# Patient Record
Sex: Female | Born: 1985 | Race: White | Hispanic: No | Marital: Married | Smoking: Never smoker
Health system: Southern US, Community
[De-identification: ages and names within clinical notes are randomized; demographics above are authoritative.]

## PROBLEM LIST (undated history)

## (undated) ENCOUNTER — Inpatient Hospital Stay (HOSPITAL_COMMUNITY): Payer: Self-pay

## (undated) DIAGNOSIS — Z889 Allergy status to unspecified drugs, medicaments and biological substances status: Secondary | ICD-10-CM

## (undated) DIAGNOSIS — Z8619 Personal history of other infectious and parasitic diseases: Secondary | ICD-10-CM

## (undated) DIAGNOSIS — K589 Irritable bowel syndrome without diarrhea: Secondary | ICD-10-CM

## (undated) DIAGNOSIS — Z8249 Family history of ischemic heart disease and other diseases of the circulatory system: Secondary | ICD-10-CM

## (undated) DIAGNOSIS — Z3480 Encounter for supervision of other normal pregnancy, unspecified trimester: Secondary | ICD-10-CM

## (undated) DIAGNOSIS — Z87898 Personal history of other specified conditions: Secondary | ICD-10-CM

## (undated) DIAGNOSIS — Z8669 Personal history of other diseases of the nervous system and sense organs: Secondary | ICD-10-CM

## (undated) DIAGNOSIS — R110 Nausea: Secondary | ICD-10-CM

## (undated) HISTORY — DX: Personal history of other infectious and parasitic diseases: Z86.19

## (undated) HISTORY — DX: Nausea: R11.0

## (undated) HISTORY — PX: WISDOM TOOTH EXTRACTION: SHX21

## (undated) HISTORY — DX: Family history of ischemic heart disease and other diseases of the circulatory system: Z82.49

## (undated) HISTORY — DX: Personal history of other specified conditions: Z87.898

## (undated) HISTORY — DX: Allergy status to unspecified drugs, medicaments and biological substances: Z88.9

## (undated) HISTORY — DX: Encounter for supervision of other normal pregnancy, unspecified trimester: Z34.80

## (undated) HISTORY — PX: ROOT CANAL: SHX2363

## (undated) HISTORY — DX: Irritable bowel syndrome, unspecified: K58.9

## (undated) HISTORY — DX: Personal history of other diseases of the nervous system and sense organs: Z86.69

---

## 2012-10-03 LAB — CHG CYTOPATH CERV/VAG THIN LAYER: Pap: NEGATIVE

## 2012-10-03 LAB — OB RESULTS CONSOLE GC/CHLAMYDIA
CHLAMYDIA, DNA PROBE: NEGATIVE
GC PROBE AMP, GENITAL: NEGATIVE

## 2012-10-03 LAB — VITAMIN D 25 HYDROXY (VIT D DEFICIENCY, FRACTURES): Vit D, 25-Hydroxy: 14.6

## 2013-03-19 ENCOUNTER — Ambulatory Visit (INDEPENDENT_AMBULATORY_CARE_PROVIDER_SITE_OTHER): Payer: Managed Care, Other (non HMO) | Admitting: Family Medicine

## 2013-03-19 ENCOUNTER — Encounter: Payer: Self-pay | Admitting: Family Medicine

## 2013-03-19 VITALS — BP 122/68 | HR 72 | Temp 98.6°F | Ht 63.0 in | Wt 135.0 lb

## 2013-03-19 DIAGNOSIS — R197 Diarrhea, unspecified: Secondary | ICD-10-CM | POA: Insufficient documentation

## 2013-03-19 DIAGNOSIS — G43909 Migraine, unspecified, not intractable, without status migrainosus: Secondary | ICD-10-CM

## 2013-03-19 DIAGNOSIS — R109 Unspecified abdominal pain: Secondary | ICD-10-CM

## 2013-03-19 MED ORDER — CYCLOBENZAPRINE HCL 10 MG PO TABS: ORAL_TABLET | ORAL | Status: DC

## 2013-03-19 NOTE — Patient Instructions (Addendum)
Take a flexeril (muscle relaxer) at bedtime each night to see if we can attain better sleep  You can take it for a headache also - but not more than one pill every 8 hours  Avoid caffeine entirely and stay active  Go to bed and get up at the same time every day  Keep a headache diary - bring it to next visit  We will refer you to GI at check out Get citurcel - one serving daily for fiber - this may help Follow up with me in about 6-8 weeks

## 2013-03-19 NOTE — Assessment & Plan Note (Signed)
See assessment for abd pain  Ref to GI

## 2013-03-19 NOTE — Assessment & Plan Note (Signed)
Ongoing - daily with frequent loose stool and hx of crohn's in family  Will tx with fiber- citrucel to see if this is helpful (disc poss of IBS) Given family hx will also ref to GI for further eval

## 2013-03-19 NOTE — Assessment & Plan Note (Signed)
Once weekly - classic symptoms but without aura Suspect major triggers are poor sleep and neck tension  Will try flexeril 10 mg at bedtime (and prn migraine with caution - and understanding no more than 1 pill every 8 hours) Disc poss side eff Long disc about triggers/ handout given/ disc better sleep hygiene and disc avoiding caffeine and nitrates Disc analgesic rebound phenomenon  >25 min spent with face to face with patient, >50% counseling and/or coordinating care   F/u 6-8 weeks May consider triptan for worse ha / may consider proph med

## 2013-03-19 NOTE — Progress Notes (Signed)
Subjective:    Patient ID: Alyssa Gutierrez, female    DOB: 09-10-1986, 27 y.o.   MRN: [id number]  HPI Here to establish as a new patient   Sees Dr Janene Harvey for gyn at [facility] (Dr Harold Hedge before) Last pap 11/13 Gyn history Is on ortho tri cyclen lo Periods are pretty regular  Getting married in October - is getting married at [place] word!  Then will think about starting a family   imms- ? Last Td and does not get flu shot    Hx of allergic rhinitis - seasonal and pet dander  Usually does not take anything / just occ zyrtec and benadryl   Hx of heart M in child hood- that corrected itself   Hx of migraine  - better past 5-10 years Gets them about 1 time per week Photophobia/ phonophobia/ nausea / and movement makes it worse  Usually frontal - starts on one side and then goes to the other side  Will take excedrin migraine-no longer works  Other nsaids do not work and tylenol does not work  Has not seen doctor for migraines  Headache usually starts right before she goes home - occ has to leave work  Headache lasts until she goes away -sometimes lasts until she wakes up  Triggers - hunger , ? Fatigue/ ? Sleep habits  Does not drink a lot of caffeine  Not a lot of stress  Likes her job  Does walk for exercise -- climbs stairs at work and at home  No fam hx of migraine    Also has upset stomach a lot  Loose stool  Abdominal pain - discomfort - usually low abdomen  Not pelvic  No blood in stool or dark stool  Gets nauseated -does not vomit No dairy - thinks she is lactose intol [place] food does not agree with her - but no other triggers  gmother had crohnss and aunt has it as well   There are no active problems to display for this patient.  Past Medical History  Diagnosis Date  . History of chicken pox     age 10  . History of headache   . History of allergy   . Family history of heart murmur     at birth  . History of migraine    Past Surgical History   Procedure Laterality Date  . Wisdom tooth extraction      age 57  . Root canal      age 42   History  Substance Use Topics  . Smoking status: Never Smoker   . Smokeless tobacco: Not on file  . Alcohol Use: No   Family History  Problem Relation Age of Onset  . Diabetes Paternal Grandmother    No Known Allergies No current outpatient prescriptions on file prior to visit.   No current facility-administered medications on file prior to visit.       Review of Systems Review of Systems  Constitutional: Negative for fever, appetite change, fatigue and unexpected weight change.  Eyes: Negative for pain and visual disturbance.  Respiratory: Negative for cough and shortness of breath.   Cardiovascular: Negative for cp or palpitations    Gastrointestinal: Negative for blood in stool/ dark stool/ vomiting or bloating  Genitourinary: Negative for urgency and frequency.  Skin: Negative for pallor or rash   Neurological: Negative for weakness, light-headedness, numbness and pos for headaches Hematological: Negative for adenopathy. Does not bruise/bleed easily.  Psychiatric/Behavioral: Negative for  dysphoric mood. The patient is not nervous/anxious.         Objective:   Physical Exam  Constitutional: She appears well-developed and well-nourished. No distress.  HENT:  Head: Normocephalic and atraumatic.  Right Ear: External ear normal.  Left Ear: External ear normal.  Nose: Nose normal.  Mouth/Throat: Oropharynx is clear and moist.  No sinus or temporal tenderness  Eyes: Conjunctivae and EOM are normal. Pupils are equal, round, and reactive to light. Right eye exhibits no discharge. Left eye exhibits no discharge. No scleral icterus.  Neck: Normal range of motion. Neck supple. No JVD present. Carotid bruit is not present. No thyromegaly present.  Cardiovascular: Normal rate, regular rhythm, normal heart sounds and intact distal pulses.  Exam reveals no gallop.   Pulmonary/Chest:  Effort normal and breath sounds normal. No respiratory distress. She has no wheezes. She has no rales.  Abdominal: Soft. Bowel sounds are normal. She exhibits no distension, no abdominal bruit and no mass. There is no hepatosplenomegaly. There is no tenderness. There is no rebound, no guarding, no CVA tenderness, no tenderness at McBurney's point and negative Murphy's sign.  Musculoskeletal: She exhibits no edema and no tenderness.  Lymphadenopathy:    She has no cervical adenopathy.  Neurological: She is alert. She has normal reflexes. No cranial nerve deficit. She exhibits normal muscle tone. Coordination normal.  Skin: Skin is warm and dry. No rash noted. No erythema. No pallor.  Psychiatric: She has a normal mood and affect.          Assessment & Plan:

## 2013-04-09 ENCOUNTER — Ambulatory Visit (INDEPENDENT_AMBULATORY_CARE_PROVIDER_SITE_OTHER): Payer: Managed Care, Other (non HMO) | Admitting: Internal Medicine

## 2013-04-09 ENCOUNTER — Other Ambulatory Visit (INDEPENDENT_AMBULATORY_CARE_PROVIDER_SITE_OTHER): Payer: Managed Care, Other (non HMO)

## 2013-04-09 ENCOUNTER — Encounter: Payer: Self-pay | Admitting: Internal Medicine

## 2013-04-09 VITALS — BP 108/70 | HR 88 | Ht 63.0 in | Wt 137.5 lb

## 2013-04-09 DIAGNOSIS — R1084 Generalized abdominal pain: Secondary | ICD-10-CM

## 2013-04-09 DIAGNOSIS — K589 Irritable bowel syndrome without diarrhea: Secondary | ICD-10-CM

## 2013-04-09 DIAGNOSIS — R110 Nausea: Secondary | ICD-10-CM

## 2013-04-09 LAB — CBC WITH DIFFERENTIAL/PLATELET
Basophils Absolute: 0 K/uL (ref 0.0–0.1)
Basophils Relative: 0.4 % (ref 0.0–3.0)
Eosinophils Absolute: 0.1 K/uL (ref 0.0–0.7)
Eosinophils Relative: 1.8 % (ref 0.0–5.0)
HCT: 39.8 % (ref 36.0–46.0)
Hemoglobin: 13.4 g/dL (ref 12.0–15.0)
Lymphocytes Relative: 28.8 % (ref 12.0–46.0)
Lymphs Abs: 2.3 K/uL (ref 0.7–4.0)
MCHC: 33.7 g/dL (ref 30.0–36.0)
MCV: 87 fl (ref 78.0–100.0)
Monocytes Absolute: 0.6 K/uL (ref 0.1–1.0)
Monocytes Relative: 7.3 % (ref 3.0–12.0)
Neutro Abs: 5 K/uL (ref 1.4–7.7)
Neutrophils Relative %: 61.7 % (ref 43.0–77.0)
Platelets: 284 K/uL (ref 150.0–400.0)
RBC: 4.57 Mil/uL (ref 3.87–5.11)
RDW: 12.9 % (ref 11.5–14.6)
WBC: 8.1 K/uL (ref 4.5–10.5)

## 2013-04-09 LAB — IGA: IgA: 294 mg/dL (ref 68–378)

## 2013-04-09 NOTE — Patient Instructions (Addendum)
Your physician has requested that you go to the basement for the following lab work before leaving today: CBC, Tissue Transglutaminase, Serum IgA, ifob.  You have been given a brochure in IBS.   cc: Roxy Manns, MD

## 2013-04-09 NOTE — Progress Notes (Signed)
HISTORY OF PRESENT ILLNESS:  Alyssa Gutierrez is a 27 y.o. female with a history of migraine headaches, who is sent today by Dr. Milinda Antis regarding chronic abdominal complaints. Patient reports problems with daily "stomachache" since about age 44. Typically, symptoms began about one hour after awakening. She notices lower abdominal discomfort associated with nausea. The discomfort is described as either dull ache lasting about 30 or 40 minutes or sharp pains. There can be associated urgency and loose stools. Symptoms are improved after defecation. Symptoms are often and exacerbated by meals and anxiety. Approximately 2 times per month she can be awoken with discomfort and nausea. She denies vomiting or bleeding. She has gained about 10 or 15 pounds in the past 2 years. She can have occasional constipation, though bowels tend to be more in the loose side. Recently started cyclobenzaprine for migraines. This has resulted in some constipation. She tells me that she has a low vitamin D level. She has tried Pepto-Bismol, Mylanta, and Imodium without relief in symptoms. Her family history is remarkable for a cousin with celiac disease as well as an aunt and a grandmother with Crohn's disease. She denies having have laboratories or other workup  REVIEW OF SYSTEMS:  All non-GI ROS negative except for sinus and allergy trouble, headaches, and menstrual cramps  Past Medical History  Diagnosis Date  . History of chicken pox     age 88  . History of headache   . History of allergy   . Family history of heart murmur     at birth  . History of migraine     Past Surgical History  Procedure Laterality Date  . Wisdom tooth extraction      age 89  . Root canal      age 68    Social History Alyssa Gutierrez  reports that she has never smoked. She has never used smokeless tobacco. She reports that she does not drink alcohol or use illicit drugs.  family history includes Diabetes in her paternal grandmother;  Heart murmur in her brother; and Hypertension in her father.  No Known Allergies     PHYSICAL EXAMINATION: Vital signs: BP 108/70  Pulse 88  Ht 5\' 3"  (1.6 m)  Wt 137 lb 8 oz (62.37 kg)  BMI 24.36 kg/m2  LMP 03/28/2013  Constitutional: generally well-appearing, no acute distress Psychiatric: alert and oriented x3, cooperative Eyes: extraocular movements intact, anicteric, conjunctiva pink Mouth: oral pharynx moist, no lesions Neck: supple no lymphadenopathy Cardiovascular: heart regular rate and rhythm, no murmur Lungs: clear to auscultation bilaterally Abdomen: soft, nontender, nondistended, no obvious ascites, no peritoneal signs, normal bowel sounds, no organomegaly Rectal: Omitted Extremities: no lower extremity edema bilaterally Skin: no lesions on visible extremities Neuro: No focal deficits. No asterixis.    ASSESSMENT:  #1. Chronic lower abdominal complaints most consistent with irritable bowel syndrome. Rule out other entities masquerading as IBS.   PLAN:  #1. Tissue transglutaminase antibody, serum IgA level, CBC #2. Stool Hemoccult cards #3. If the above studies are negative, then treatment for IBS with antispasmodics such as Librax. Would anticipate office followup thereafter #4. Discussion of IBS and supplemental literature provided for her review

## 2013-04-13 ENCOUNTER — Other Ambulatory Visit (INDEPENDENT_AMBULATORY_CARE_PROVIDER_SITE_OTHER): Payer: Managed Care, Other (non HMO)

## 2013-04-13 DIAGNOSIS — R1084 Generalized abdominal pain: Secondary | ICD-10-CM

## 2013-04-13 DIAGNOSIS — K589 Irritable bowel syndrome without diarrhea: Secondary | ICD-10-CM

## 2013-04-13 DIAGNOSIS — R110 Nausea: Secondary | ICD-10-CM

## 2013-04-16 ENCOUNTER — Other Ambulatory Visit: Payer: Self-pay | Admitting: Internal Medicine

## 2013-04-16 MED ORDER — CILIDINIUM-CHLORDIAZEPOXIDE 2.5-5 MG PO CAPS: ORAL_CAPSULE | ORAL | Status: DC

## 2013-05-01 ENCOUNTER — Encounter: Payer: Self-pay | Admitting: Family Medicine

## 2013-05-01 ENCOUNTER — Ambulatory Visit (INDEPENDENT_AMBULATORY_CARE_PROVIDER_SITE_OTHER): Payer: Managed Care, Other (non HMO) | Admitting: Family Medicine

## 2013-05-01 VITALS — BP 116/72 | HR 115 | Temp 98.9°F | Ht 63.0 in | Wt 139.2 lb

## 2013-05-01 DIAGNOSIS — G43909 Migraine, unspecified, not intractable, without status migrainosus: Secondary | ICD-10-CM

## 2013-05-01 MED ORDER — GABAPENTIN 300 MG PO CAPS: 300.0000 mg | ORAL_CAPSULE | Freq: Two times a day (BID) | ORAL | Status: DC

## 2013-05-01 NOTE — Progress Notes (Signed)
Subjective:    Patient ID: Alyssa Gutierrez, female    DOB: Sep 08, 1986, 27 y.o.   MRN: [id number]  HPI Doing well overall - here for f/u of headaches  They are getting a little less frequent- but most weekends she gets at least one   Over father's day weekend -had one really bad headache - took flexeril  No other medicines   She sleeps better - still wakes up but easier for her to go of sleep   During the week - goes to bed 10:30- 11 and gets up at 6:30 Weekends-the same schedule and gets up at 7:30 - then sometimes wakes up with a headache   No mood issues at all   Saw GI about her symptoms - and given librax  More constipation - due to the flexeril however  (but helps with the cramps) Lab work was ok  Cutting back on caffeine   Patient Active Problem List   Diagnosis Date Noted  . Diarrhea 03/19/2013  . Abdominal  pain, other specified site 03/19/2013  . Migraine headache 03/19/2013   Past Medical History  Diagnosis Date  . History of chicken pox     age 89  . History of headache   . History of allergy   . Family history of heart murmur     at birth  . History of migraine    Past Surgical History  Procedure Laterality Date  . Wisdom tooth extraction      age 37  . Root canal      age 35   History  Substance Use Topics  . Smoking status: Never Smoker   . Smokeless tobacco: Never Used  . Alcohol Use: No   Family History  Problem Relation Age of Onset  . Diabetes Paternal Grandmother   . Hypertension Father   . Heart murmur Brother    No Known Allergies Current Outpatient Prescriptions on File Prior to Visit  Medication Sig Dispense Refill  . clidinium-chlordiazePOXIDE (LIBRAX) 2.5-5 MG per capsule Take 1 by mouth before meals as needed  60 capsule  3  . cyclobenzaprine (FLEXERIL) 10 MG tablet Take 1 by mouth each bedtime and 1 as needed for headache (no 2 doses closer than 8 hours apart however)  45 tablet  2  . ORTHO TRI-CYCLEN LO 0.18/0.215/0.25 MG-25  MCG tab Take 1 tablet by mouth daily.       No current facility-administered medications on file prior to visit.      Review of Systems Review of Systems  Constitutional: Negative for fever, appetite change, fatigue and unexpected weight change.  Eyes: Negative for pain and visual disturbance.  Respiratory: Negative for cough and shortness of breath.   Cardiovascular: Negative for cp or palpitations    Gastrointestinal: Negative for nausea, diarrhea and constipation.  Genitourinary: Negative for urgency and frequency.  Skin: Negative for pallor or rash   Neurological: Negative for weakness, light-headedness, numbness and pos for headaches.  Hematological: Negative for adenopathy. Does not bruise/bleed easily.  Psychiatric/Behavioral: Negative for dysphoric mood. The patient is not nervous/anxious.         Objective:   Physical Exam  Constitutional: She appears well-developed and well-nourished. No distress.  HENT:  Head: Normocephalic and atraumatic.  Mouth/Throat: Oropharynx is clear and moist.  Eyes: Conjunctivae and EOM are normal. Pupils are equal, round, and reactive to light. Right eye exhibits no discharge. Left eye exhibits no discharge. No scleral icterus.  Neck: Normal range of motion. Neck supple.  No thyromegaly present.  Cardiovascular: Normal rate, regular rhythm and intact distal pulses.   No murmur heard. Pulmonary/Chest: Effort normal and breath sounds normal. No respiratory distress. She has no wheezes.  Musculoskeletal: She exhibits no edema.  Lymphadenopathy:    She has no cervical adenopathy.  Neurological: She is alert. She has normal reflexes. She displays no tremor. No cranial nerve deficit or sensory deficit. She exhibits normal muscle tone. Coordination and gait normal.  No focal cerebellar signs   Skin: Skin is warm and dry. No rash noted. No erythema. No pallor.  Psychiatric: She has a normal mood and affect.          Assessment & Plan:

## 2013-05-01 NOTE — Assessment & Plan Note (Signed)
Flexeril helps sleep and ha but pt is still having weekend ha regularly Disc lifestyle habits and mt good sleep Will try gabapentin for migraine prophylaxis and start with 300 at bedtime-titrate to bid if tolerated F/u 4-6 wk Rev side eff-will call if problems

## 2013-05-01 NOTE — Patient Instructions (Addendum)
Start gabapentin 300 mg about 1 hour before bed - if you do well after 1-2 weeks increase it to 1 pill twice daily  It is common to feel woozy and sleepy initially - but most patients get used to that very fast  If you have side effects that do not go away- let me know  Use flexeril when you need it for a headache  Try to make sleep habits the same every day  Follow up with me in 6-8 weeks

## 2013-05-15 ENCOUNTER — Ambulatory Visit: Payer: Managed Care, Other (non HMO) | Admitting: Internal Medicine

## 2013-05-15 ENCOUNTER — Encounter: Payer: Self-pay | Admitting: Internal Medicine

## 2013-05-15 ENCOUNTER — Ambulatory Visit (INDEPENDENT_AMBULATORY_CARE_PROVIDER_SITE_OTHER): Payer: Managed Care, Other (non HMO) | Admitting: Internal Medicine

## 2013-05-15 VITALS — BP 100/60 | HR 66 | Ht 63.0 in | Wt 136.0 lb

## 2013-05-15 DIAGNOSIS — R109 Unspecified abdominal pain: Secondary | ICD-10-CM

## 2013-05-15 DIAGNOSIS — K589 Irritable bowel syndrome without diarrhea: Secondary | ICD-10-CM

## 2013-05-15 MED ORDER — ALIGN PO CAPS: 1.0000 | ORAL_CAPSULE | Freq: Every day | ORAL | Status: DC

## 2013-05-15 MED ORDER — HYOSCYAMINE SULFATE 0.125 MG SL SUBL: SUBLINGUAL_TABLET | SUBLINGUAL | Status: DC

## 2013-05-15 NOTE — Patient Instructions (Addendum)
We have sent the following medications to your pharmacy for you to pick up at your convenience:  Levsin  We have given you samples of Align. This puts good bacteria back into your colon. You should take 1 capsule by mouth once daily. If this works well for you, it can be purchased over the counter.  Please follow up with Dr. Marina Goodell in 6 months  You have been given some information on IBS

## 2013-05-15 NOTE — Progress Notes (Signed)
HISTORY OF PRESENT ILLNESS:  Alyssa Gutierrez is a 27 y.o. female  With a history of migraine headaches who was evaluated in 04/09/2013 regarding chronic abdominal complaints. She was felt to have irritable bowel syndrome. CBC, tissue transglutaminase antibody, serum IgA level, and stool Hemoccult cards were all normal. She was prescribed Librax before meals as needed. She tries on a couple of occasions but reports intolerance with a "gas bubble" type pain. She presents today for followup. Overall, she states that her bowel habits seem to have improved after she initiated her new medication for migraines. This seems to result in less diarrhea. She has had 2 occasions with significant cramping abdominal discomfort. She inquired about probiotic. She is planning her wedding.  REVIEW OF SYSTEMS:  All non-GI ROS negative except for headaches, sleeping problems  Past Medical History  Diagnosis Date  . History of chicken pox     age 62  . History of headache   . History of allergy   . Family history of heart murmur     at birth  . History of migraine     Past Surgical History  Procedure Laterality Date  . Wisdom tooth extraction      age 64  . Root canal      age 39    Social History Alyssa Gutierrez  reports that she has never smoked. She has never used smokeless tobacco. She reports that she does not drink alcohol or use illicit drugs.  family history includes Diabetes in her paternal grandmother; Heart murmur in her brother; and Hypertension in her father.  There is no history of Colon cancer.  No Known Allergies     PHYSICAL EXAMINATION: Vital signs: BP 100/60  Pulse 66  Ht 5\' 3"  (1.6 m)  Wt 136 lb (61.689 kg)  BMI 24.1 kg/m2  LMP 05/14/2013 General: Well-developed, well-nourished, no acute distress HEENT: Sclerae are anicteric, conjunctiva pink. Oral mucosa intact Lungs: Clear Heart: Regular Abdomen: soft, nontender, nondistended, no obvious ascites, no peritoneal signs,  normal bowel sounds. No organomegaly. Extremities: No edema Psychiatric: alert and oriented x3. Cooperative   ASSESSMENT:  #1. IBS. Chronic. No evidence for inflammatory bowel disease or other organic processes.  PLAN:  #1. Discussion on IBS (detailed) today #2. Literature on IBS provided for her review #3. Stop Librax #4. Levsin sublingual when necessary. Prescribed #5. Probiotic align one daily for 2 weeks. If helpful, may use on demand. Multiple samples given #6. Office followup in 6 months. Contact the office in the interim for any questions or problems. Resume general medical follow up with Dr. Milinda Antis

## 2013-06-07 ENCOUNTER — Telehealth: Payer: Self-pay

## 2013-06-07 NOTE — Telephone Encounter (Signed)
pt left v/m; pt already scheduled to see Dr Milinda Antis on 06/15/13 at 3:45 pm, pts employer requires barometric screening form filled out to get decreased insurance rate; pt wants to know if can get glucose and cholesterol checked at 06/15/13 appt.Please advise.

## 2013-06-07 NOTE — Telephone Encounter (Signed)
That is fine - have her fast for appt or eat light

## 2013-06-07 NOTE — Telephone Encounter (Signed)
Pt notified we can fill out form at appt but try to fast/eat light for labs that day

## 2013-06-15 ENCOUNTER — Ambulatory Visit (INDEPENDENT_AMBULATORY_CARE_PROVIDER_SITE_OTHER): Payer: Managed Care, Other (non HMO) | Admitting: Family Medicine

## 2013-06-15 ENCOUNTER — Encounter: Payer: Self-pay | Admitting: Family Medicine

## 2013-06-15 VITALS — BP 108/74 | HR 84 | Temp 98.6°F | Ht 63.0 in | Wt 136.5 lb

## 2013-06-15 DIAGNOSIS — Z131 Encounter for screening for diabetes mellitus: Secondary | ICD-10-CM | POA: Insufficient documentation

## 2013-06-15 DIAGNOSIS — Z13220 Encounter for screening for lipoid disorders: Secondary | ICD-10-CM | POA: Insufficient documentation

## 2013-06-15 DIAGNOSIS — G43909 Migraine, unspecified, not intractable, without status migrainosus: Secondary | ICD-10-CM

## 2013-06-15 MED ORDER — GABAPENTIN 100 MG PO CAPS: 100.0000 mg | ORAL_CAPSULE | Freq: Every day | ORAL | Status: DC | PRN

## 2013-06-15 MED ORDER — GABAPENTIN 300 MG PO CAPS: 300.0000 mg | ORAL_CAPSULE | Freq: Every day | ORAL | Status: DC

## 2013-06-15 NOTE — Patient Instructions (Addendum)
Keep wake and sleep times as stable as possible  Stay hydrated  Try the gabapentin 100 mg during the day if you need or want to - take with food

## 2013-06-15 NOTE — Progress Notes (Signed)
Subjective:    Patient ID: Alyssa Gutierrez, female    DOB: August 18, 1986, 27 y.o.   MRN: [id number]  HPI Here for follow up for migraines   Was having them 2-3 times per week Since then has had 3 headaches only-- very happy about that   Gabapentin - taking at night only  Day time dose makes her nauseated (got through about 2 days and that was it)  Most of headaches are on the weekend   During the week - bed at 10:30 and gets up at 6:15- wishes she could get more  On the weekends may push bedtime until 11 and sleeps no later than 7:30  Most of her headaches are at night  No more soda or coffee  Diet on the weekends - she eats at very different times , and she could be eating some MSG Exercises more on the weekend   Stress level is high - at work and also getting married   Patient Active Problem List   Diagnosis Date Noted  . Diarrhea 03/19/2013  . Abdominal  pain, other specified site 03/19/2013  . Migraine headache 03/19/2013   Past Medical History  Diagnosis Date  . History of chicken pox     age 65  . History of headache   . History of allergy   . Family history of heart murmur     at birth  . History of migraine    Past Surgical History  Procedure Laterality Date  . Wisdom tooth extraction      age 26  . Root canal      age 31   History  Substance Use Topics  . Smoking status: Never Smoker   . Smokeless tobacco: Never Used  . Alcohol Use: No   Family History  Problem Relation Age of Onset  . Diabetes Paternal Grandmother   . Hypertension Father   . Heart murmur Brother   . Colon cancer Neg Hx    No Known Allergies Current Outpatient Prescriptions on File Prior to Visit  Medication Sig Dispense Refill  . bifidobacterium infantis (ALIGN) capsule Take 1 capsule by mouth daily.  42 capsule  0  . clidinium-chlordiazePOXIDE (LIBRAX) 2.5-5 MG per capsule Take 1 by mouth before meals as needed  60 capsule  3  . cyclobenzaprine (FLEXERIL) 10 MG tablet Take 1  by mouth each bedtime and 1 as needed for headache (no 2 doses closer than 8 hours apart however)  45 tablet  2  . hyoscyamine (LEVSIN SL) 0.125 MG SL tablet Take 1-2 tablets sublingually as needed for pain  40 tablet  0  . ORTHO TRI-CYCLEN LO 0.18/0.215/0.25 MG-25 MCG tab Take 1 tablet by mouth daily.       No current facility-administered medications on file prior to visit.     Also needs biometric screening for work  Review of Systems Review of Systems  Constitutional: Negative for fever, appetite change, fatigue and unexpected weight change.  Eyes: Negative for pain and visual disturbance.  Respiratory: Negative for cough and shortness of breath.   Cardiovascular: Negative for cp or palpitations    Gastrointestinal: Negative for nausea, diarrhea and constipation.  Genitourinary: Negative for urgency and frequency.  Skin: Negative for pallor or rash   Neurological: Negative for weakness, light-headedness, numbness and pos for ha that are improving Hematological: Negative for adenopathy. Does not bruise/bleed easily.  Psychiatric/Behavioral: Negative for dysphoric mood. The patient is not nervous/anxious.  Objective:   Physical Exam  Constitutional: She appears well-developed and well-nourished. No distress.  HENT:  Head: Normocephalic and atraumatic.  Mouth/Throat: Oropharynx is clear and moist.  Eyes: Conjunctivae and EOM are normal. No scleral icterus.  Neck: Normal range of motion. Neck supple. Carotid bruit is not present. No thyromegaly present.  Cardiovascular: Normal rate, regular rhythm and normal heart sounds.   Pulmonary/Chest: Effort normal and breath sounds normal. No respiratory distress. She has no wheezes.  Lymphadenopathy:    She has no cervical adenopathy.  Neurological: She is alert. She has normal reflexes. She displays no tremor. No cranial nerve deficit. She exhibits normal muscle tone. Coordination normal.  Skin: Skin is warm and dry. No rash noted.   Psychiatric: She has a normal mood and affect.          Assessment & Plan:

## 2013-06-16 LAB — GLUCOSE, RANDOM: Glucose, Bld: 88 mg/dL (ref 70–99)

## 2013-06-16 LAB — LIPID PANEL
HDL: 64 mg/dL (ref >39)
LDL Cholesterol: 102 mg/dL — ABNORMAL HIGH (ref 0–99)
Total CHOL/HDL Ratio: 2.8 Ratio

## 2013-06-17 NOTE — Assessment & Plan Note (Signed)
Lipids today Pt needs form for biometric screening filled out

## 2013-06-17 NOTE — Assessment & Plan Note (Signed)
Pt needs blood sugar random for biometric screening for work Check today Will fill out form  No symptoms of DM

## 2013-06-17 NOTE — Assessment & Plan Note (Signed)
Much improved on gabapentin  Will work on more regular sleep schedules to prevent weekend headaches Also given px for the 100 mg dose to try for day time if needed Will update if problems

## 2013-07-18 ENCOUNTER — Other Ambulatory Visit: Payer: Self-pay | Admitting: *Deleted

## 2013-07-18 MED ORDER — GABAPENTIN 300 MG PO CAPS: 300.0000 mg | ORAL_CAPSULE | Freq: Every day | ORAL | Status: DC

## 2013-07-18 NOTE — Telephone Encounter (Signed)
90 day supply requested

## 2013-08-08 ENCOUNTER — Telehealth: Payer: Self-pay

## 2013-08-08 NOTE — Telephone Encounter (Signed)
I doubt it would cause problems- but since it is not FDA studied - know that we cannot say for sure Green tea can stimulate (even if caffeine free)- so do not take it close to bedtime  If any side effects- stop it

## 2013-08-08 NOTE — Telephone Encounter (Signed)
Pt left v/m; pt wants to verify it is OK to take green tea extract 315 mg vitamin with pt's other medications. Pt request cb.

## 2013-08-09 NOTE — Telephone Encounter (Signed)
Pt notified of Dr. Tower's comments  

## 2013-09-06 ENCOUNTER — Other Ambulatory Visit: Payer: Self-pay

## 2013-11-01 NOTE — L&D Delivery Note (Signed)
Delivery Note Waterbirth.  Pushed well with good heart tones throughout.   At 5:29 PM a viable and healthy female was delivered via Vaginal, Spontaneous Delivery (Presentation: ; Occiput Anterior).   Unable to delivery anterior shoulder. Loop of cord noted at neck, coming through vagina.  Loose with +pulse.  I was able to grasp posterior axilla and then the hand (right arm) and sweep the posterior arm out. Then rest of baby delivered easily.  Vigorous cry immediately. Total time less than 40 seconds.    APGAR: 8, 9; weight .   Placenta status: Intact, Spontaneous.  Cord: 3 vessels with the following complications: None.    Anesthesia: Local Pudendal  Episiotomy: None Lacerations: 2nd degree;Perineal     Dr Emelda FearFerguson consulted to evaluate laceration. No entry into capsule or posterior wall. Second degree repaired in usual fashion under Pudendal anesthesia.  Suture Repair: 3.0 vicryl rapide Est. Blood Loss (mL): 300  Mom to postpartum.  Baby to Couplet care / Skin to Skin.  [facility]ILL[facility],Alyssa Gutierrez 08/07/2014, 6:44 PM

## 2013-11-01 NOTE — L&D Delivery Note (Signed)
Attestation of Attending Supervision of Advanced Practitioner (CNM/NP): Evaluation and management procedures were performed by the Advanced Practitioner under my supervision and collaboration.  I have reviewed the Advanced Practitioner's note and chart, and I agree with the management and plan.  HARRAWAY-SMITH, Maguire Killmer 9:04 AM     

## 2013-12-18 ENCOUNTER — Ambulatory Visit: Payer: Managed Care, Other (non HMO)

## 2013-12-25 ENCOUNTER — Ambulatory Visit: Payer: Managed Care, Other (non HMO)

## 2014-01-01 ENCOUNTER — Ambulatory Visit (INDEPENDENT_AMBULATORY_CARE_PROVIDER_SITE_OTHER): Payer: Managed Care, Other (non HMO)

## 2014-01-01 DIAGNOSIS — Z3400 Encounter for supervision of normal first pregnancy, unspecified trimester: Secondary | ICD-10-CM

## 2014-01-01 LAB — HIV ANTIBODY (ROUTINE TESTING W REFLEX): HIV: NONREACTIVE

## 2014-01-01 LAB — POCT PREGNANCY, URINE: Preg Test, Ur: POSITIVE — AB

## 2014-01-01 NOTE — Progress Notes (Signed)
Pt came in for a pregnancy test and resulted positive.  Pt stated that she was going to be getting care here at this office.  Scheduled US with patient for May 19th on 0830.  Labs drawn today urine culture, ob panel, pap stated she had a recent pap smear requested pt to bring in pap smear results from facility.  Pt agreed.  We also drew a GC/Chamlydia off urine with patient bringing updated pap.  LMP January 3rd pt is sure.  Today with LMP patient presents as 8w 3d.

## 2014-01-02 LAB — OBSTETRIC PANEL
Antibody Screen: NEGATIVE
Basophils Absolute: 0 10*3/uL (ref 0.0–0.1)
Basophils Relative: 0 % (ref 0–1)
EOS PCT: 1 % (ref 0–5)
Eosinophils Absolute: 0.1 10*3/uL (ref 0.0–0.7)
HCT: 40.7 % (ref 36.0–46.0)
HEMOGLOBIN: 14.2 g/dL (ref 12.0–15.0)
Hepatitis B Surface Ag: NEGATIVE
LYMPHS ABS: 1.7 10*3/uL (ref 0.7–4.0)
LYMPHS PCT: 26 % (ref 12–46)
MCH: 29.7 pg (ref 26.0–34.0)
MCHC: 34.9 g/dL (ref 30.0–36.0)
MCV: 85.1 fL (ref 78.0–100.0)
MONOS PCT: 6 % (ref 3–12)
Monocytes Absolute: 0.4 10*3/uL (ref 0.1–1.0)
Neutro Abs: 4.5 10*3/uL (ref 1.7–7.7)
Neutrophils Relative %: 67 % (ref 43–77)
Platelets: 300 10*3/uL (ref 150–400)
RBC: 4.78 MIL/uL (ref 3.87–5.11)
RDW: 13.3 % (ref 11.5–15.5)
Rh Type: POSITIVE
Rubella: 0.63 Index (ref <0.90)
WBC: 6.7 10*3/uL (ref 4.0–10.5)

## 2014-01-02 LAB — PRESCRIPTION MONITORING PROFILE (19 PANEL)
Amphetamine/Meth: NEGATIVE ng/mL
BUPRENORPHINE, URINE: NEGATIVE ng/mL
Barbiturate Screen, Urine: NEGATIVE ng/mL
Benzodiazepine Screen, Urine: NEGATIVE ng/mL
Cannabinoid Scrn, Ur: NEGATIVE ng/mL
Carisoprodol, Urine: NEGATIVE ng/mL
Cocaine Metabolites: NEGATIVE ng/mL
Creatinine, Urine: 172.85 mg/dL (ref >20.0)
ECSTASY: NEGATIVE ng/mL
FENTANYL URINE: NEGATIVE ng/mL
MEPERIDINE UR: NEGATIVE ng/mL
METHAQUALONE SCREEN (URINE): NEGATIVE ng/mL
Methadone Screen, Urine: NEGATIVE ng/mL
Nitrites, Initial: NEGATIVE ug/mL
Opiate Screen, Urine: NEGATIVE ng/mL
Oxycodone Screen, Ur: NEGATIVE ng/mL
PH URINE, INITIAL: 8.3 pH (ref 4.5–8.9)
PHENCYCLIDINE, UR: NEGATIVE ng/mL
Propoxyphene: NEGATIVE ng/mL
TRAMADOL UR: NEGATIVE ng/mL
Tapentadol, urine: NEGATIVE ng/mL
ZOLPIDEM, URINE: NEGATIVE ng/mL

## 2014-01-02 LAB — CULTURE, OB URINE
Colony Count: NO GROWTH
Organism ID, Bacteria: NO GROWTH

## 2014-01-02 LAB — GC/CHLAMYDIA PROBE AMP
CT PROBE, AMP APTIMA: NEGATIVE
GC Probe RNA: NEGATIVE

## 2014-01-08 ENCOUNTER — Telehealth: Payer: Self-pay | Admitting: *Deleted

## 2014-01-08 NOTE — Telephone Encounter (Signed)
Pt left message stating that she is [redacted]wks pregnant and has first Ob appt on 01/31/14.  She has been experiencing some light pink d/c and wants to know if this is normal. I returned pt's call and discussed her concern. She denied having recent intercourse or strenuous activity. I advised pt that this type of vaginal d/c is wnl. She should go to MAU if she develops heavy vaginal bleeding or strong abdominal pain.  Pt also stated that last week she passed something that looked like a small amount of tissue. It was light pink in color.  I advised pt to observe for any additional episodes of tissue-like d/c and to go to MAU if that occurs.  Pt voiced understanding of all information and instructions given.

## 2014-01-17 ENCOUNTER — Telehealth: Payer: Self-pay | Admitting: *Deleted

## 2014-01-17 NOTE — Telephone Encounter (Signed)
Alyssa Gutierrez called and left a message she has some questions.  Called Alyssa Gutierrez, she is early pregnant- wants to know if it ok to travel by air in early pregnancy- I confirmed yes, she can travel.- she states she is aware she should get up and move around every hour to prevent clots.  Also wants to know is it is safe to take something for motions sickness. Informed her I would need to check with a provider and call her back.

## 2014-01-21 NOTE — Telephone Encounter (Signed)
Spoke to Dr. Debroah LoopArnold and confirmed that it is OK to take medication for motion sickness in pregnancy-- ie: dramamine, dicyclamine etc. Called pt. And left message stating it is OK to take medications for motion sickness in pregnancy and listed dramamine as an example but said any OTC for motion sickness will be fine, call clinic if any other questions or concerns.

## 2014-01-29 ENCOUNTER — Telehealth: Payer: Self-pay | Admitting: *Deleted

## 2014-01-29 MED ORDER — PROMETHAZINE HCL 25 MG PO TABS: 25.0000 mg | ORAL_TABLET | Freq: Four times a day (QID) | ORAL | Status: DC | PRN

## 2014-01-29 NOTE — Telephone Encounter (Signed)
Pt left message stating that she thinks she has a virus. She has been having nausea and vomiting and has a fever of 100.3.  She is tolerating liquids but cannot keep any food down. She wants to know what she can take to get the fever down and treat her other sx. I called pt and discussed her concerns. She states she is 12 wks by LMP and has new Ob appt in clinic on 4/2.  I informed pt that she can take Tylenol for the fever. She should go to MAU if her fever remains elevated >100.4 despite the use of tylenol. I will send in Rx to treat the nausea and she should continue taking fluids and advance to soft foods as she becomes able. She should go to MAU if her N&V continues greater than 24 hrs and does not respond to Rx therapy. Pt voiced understanding. Rx obtained from Dr. Macon LargeAnyanwu and sent to pharmacy.

## 2014-01-31 ENCOUNTER — Encounter: Payer: Self-pay | Admitting: Obstetrics and Gynecology

## 2014-01-31 ENCOUNTER — Ambulatory Visit (INDEPENDENT_AMBULATORY_CARE_PROVIDER_SITE_OTHER): Payer: Managed Care, Other (non HMO) | Admitting: Obstetrics and Gynecology

## 2014-01-31 VITALS — BP 114/78 | Temp 98.2°F | Wt 134.7 lb

## 2014-01-31 DIAGNOSIS — Z3401 Encounter for supervision of normal first pregnancy, first trimester: Secondary | ICD-10-CM | POA: Insufficient documentation

## 2014-01-31 DIAGNOSIS — Z3400 Encounter for supervision of normal first pregnancy, unspecified trimester: Secondary | ICD-10-CM

## 2014-01-31 LAB — POCT URINALYSIS DIP (DEVICE)
Glucose, UA: NEGATIVE mg/dL
Nitrite: NEGATIVE
PH: 6 (ref 5.0–8.0)
Protein, ur: NEGATIVE mg/dL
SPECIFIC GRAVITY, URINE: 1.025 (ref 1.005–1.030)
UROBILINOGEN UA: 2 mg/dL — AB (ref 0.0–1.0)

## 2014-01-31 NOTE — Progress Notes (Signed)
Subjective:    TANZIE ROTHSCHILD is a G1P0000 [redacted]w[redacted]d being seen today for her first obstetrical visit.  Her obstetrical history is significant for primigravida. Patient does intend to breast feed. Pregnancy history fully reviewed.  Patient reports no complaints. Quit Neurontin for migraines when trying toconceive and no problem with migraines thus far. 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   Filed Vitals:   01/31/14 1339  BP: 114/78  Temp: 98.2 F (36.8 C)  Weight: 134 lb 11.2 oz (61.1 kg)    HISTORY: OB History  Gravida Para Term Preterm AB SAB TAB Ectopic Multiple Living  1 0 0 0 0 0 0 0 0 0     # Outcome Date GA Lbr Len/2nd Weight Sex Delivery Anes PTL Lv  1 CUR              Past Medical History  Diagnosis Date  . History of chicken pox     age 28  . History of headache   . History of allergy   . Family history of heart murmur     at birth  . History of migraine   . IBS (irritable bowel syndrome)    Past Surgical History  Procedure Laterality Date  . Wisdom tooth extraction      age 28  . Root canal      age 8   Family History  Problem Relation Age of Onset  . Diabetes Paternal Grandmother   . Hypertension Father   . Heart murmur Brother   . Colon cancer Neg Hx   . Hypertension Paternal Uncle   . Crohn's disease Maternal Grandmother      Exam    Uterus:     Pelvic Exam:    Perineum: Normal Perineum   Vulva: normal, Bartholin's, Urethra, Skene's normal   Vagina:  normal mucosa, normal discharge       Cervix: thick closed   Adnexa: normal adnexa and no mass, fullness, tenderness   Bony Pelvis: average  System: Breast:  normal appearance, no masses or tenderness   Skin: normal coloration and turgor, no rashes    Neurologic: oriented, normal, grossly non-focal   Extremities: normal strength, tone, and muscle mass   HEENT PERRLA, extra ocular movement intact and sclera clear, anicteric   Mouth/Teeth mucous membranes moist, pharynx normal without lesions and dental  hygiene good   Neck supple and no masses   Cardiovascular: regular rate and rhythm, no murmurs or gallops   Respiratory:  appears well, vitals normal, no respiratory distress, acyanotic, normal RR, ear and throat exam is normal, neck free of mass or lymphadenopathy, chest clear, no wheezing, crepitations, rhonchi, normal symmetric air entry   Abdomen: soft, non-tender; bowel sounds normal; no masses,  no organomegaly DT 160. Fundus 1/3 to U   Urinary: urethral meatus normal      Assessment:    Pregnancy: G1P0000 Patient Active Problem List   Diagnosis Date Noted  . Supervision of normal first pregnancy in first trimester 01/31/2014  . Screening for lipoid disorders 06/15/2013  . Diarrhea 03/19/2013  . Abdominal  pain, other specified site 03/19/2013  . Migraine headache 03/19/2013        Plan:     Initial labs drawn. Prenatal vitamins. Problem list reviewed and updated. Genetic Screening discussed Quad Screen: requested.  Ultrasound discussed; fetal survey: ordered.  Follow up in 4 weeks. 50% of 30 min visit spent on counseling and coordination of care.  Continue PNVs, good diet; pregnancy precautions  Will do  waterbirth class lateer  [employer],Cason Dabney 01/31/2014

## 2014-01-31 NOTE — Patient Instructions (Signed)
Pregnancy - First Trimester  During sexual intercourse, millions of sperm go into the vagina. Only 1 sperm will penetrate and fertilize the female egg while it is in the Fallopian tube. One week later, the fertilized egg implants into the wall of the uterus. An embryo begins to develop into a baby. At 6 to 8 weeks, the eyes and face are formed and the heartbeat can be seen on ultrasound. At the end of 12 weeks (first trimester), all the baby's organs are formed. Now that you are pregnant, you will want to do everything you can to have a healthy baby. Two of the most important things are to get good prenatal care and follow your caregiver's instructions. Prenatal care is all the medical care you receive before the baby's birth. It is given to prevent, find, and treat problems during the pregnancy and childbirth.  PRENATAL EXAMS  · During prenatal visits, your weight, blood pressure, and urine are checked. This is done to make sure you are healthy and progressing normally during the pregnancy.  · A pregnant woman should gain 25 to 35 pounds during the pregnancy. However, if you are overweight or underweight, your caregiver will advise you regarding your weight.  · Your caregiver will ask and answer questions for you.  · Blood work, cervical cultures, other necessary tests, and a Pap test are done during your prenatal exams. These tests are done to check on your health and the probable health of your baby. Tests are strongly recommended and done for HIV with your permission. This is the virus that causes AIDS. These tests are done because medicines can be given to help prevent your baby from being born with this infection should you have been infected without knowing it. Blood work is also used to find out your blood type, previous infections, and follow your blood levels (hemoglobin).  · Low hemoglobin (anemia) is common during pregnancy. Iron and vitamins are given to help prevent this. Later in the pregnancy, blood  tests for diabetes will be done along with any other tests if any problems develop.  · You may need other tests to make sure you and the baby are doing well.  CHANGES DURING THE FIRST TRIMESTER   Your body goes through many changes during pregnancy. They vary from person to person. Talk to your caregiver about changes you notice and are concerned about. Changes can include:  · Your menstrual period stops.  · The egg and sperm carry the genes that determine what you look like. Genes from you and your partner are forming a baby. The female genes determine whether the baby is a boy or a girl.  · Your body increases in girth and you may feel bloated.  · Feeling sick to your stomach (nauseous) and throwing up (vomiting). If the vomiting is uncontrollable, call your caregiver.  · Your breasts will begin to enlarge and become tender.  · Your nipples may stick out more and become darker.  · The need to urinate more. Painful urination may mean you have a bladder infection.  · Tiring easily.  · Loss of appetite.  · Cravings for certain kinds of food.  · At first, you may gain or lose a couple of pounds.  · You may have changes in your emotions from day to day (excited to be pregnant or concerned something may go wrong with the pregnancy and baby).  · You may have more vivid and strange dreams.  HOME CARE INSTRUCTIONS   ·   It is very important to avoid all smoking, alcohol and non-prescribed drugs during your pregnancy. These affect the formation and growth of the baby. Avoid chemicals while pregnant to ensure the delivery of a healthy infant.  · Start your prenatal visits by the 12th week of pregnancy. They are usually scheduled monthly at first, then more often in the last 2 months before delivery. Keep your caregiver's appointments. Follow your caregiver's instructions regarding medicine use, blood and lab tests, exercise, and diet.  · During pregnancy, you are providing food for you and your baby. Eat regular, well-balanced  meals. Choose foods such as meat, fish, milk and other low fat dairy products, vegetables, fruits, and whole-grain breads and cereals. Your caregiver will tell you of the ideal weight gain.  · You can help morning sickness by keeping soda crackers at the bedside. Eat a couple before arising in the morning. You may want to use the crackers without salt on them.  · Eating 4 to 5 small meals rather than 3 large meals a day also may help the nausea and vomiting.  · Drinking liquids between meals instead of during meals also seems to help nausea and vomiting.  · A physical sexual relationship may be continued throughout pregnancy if there are no other problems. Problems may be early (premature) leaking of amniotic fluid from the membranes, vaginal bleeding, or belly (abdominal) pain.  · Exercise regularly if there are no restrictions. Check with your caregiver or physical therapist if you are unsure of the safety of some of your exercises. Greater weight gain will occur in the last 2 trimesters of pregnancy. Exercising will help:  · Control your weight.  · Keep you in shape.  · Prepare you for labor and delivery.  · Help you lose your pregnancy weight after you deliver your baby.  · Wear a good support or jogging bra for breast tenderness during pregnancy. This may help if worn during sleep too.  · Ask when prenatal classes are available. Begin classes when they are offered.  · Do not use hot tubs, steam rooms, or saunas.  · Wear your seat belt when driving. This protects you and your baby if you are in an accident.  · Avoid raw meat, uncooked cheese, cat litter boxes, and soil used by cats throughout the pregnancy. These carry germs that can cause birth defects in the baby.  · The first trimester is a good time to visit your dentist for your dental health. Getting your teeth cleaned is okay. Use a softer toothbrush and brush gently during pregnancy.  · Ask for help if you have financial, counseling, or nutritional needs  during pregnancy. Your caregiver will be able to offer counseling for these needs as well as refer you for other special needs.  · Do not take any medicines or herbs unless told by your caregiver.  · Inform your caregiver if there is any mental or physical domestic violence.  · Make a list of emergency phone numbers of family, friends, hospital, and police and fire departments.  · Write down your questions. Take them to your prenatal visit.  · Do not douche.  · Do not cross your legs.  · If you have to stand for long periods of time, rotate you feet or take small steps in a circle.  · You may have more vaginal secretions that may require a sanitary pad. Do not use tampons or scented sanitary pads.  MEDICINES AND DRUG USE IN PREGNANCY  ·   Take prenatal vitamins as directed. The vitamin should contain 1 milligram of folic acid. Keep all vitamins out of reach of children. Only a couple vitamins or tablets containing iron may be fatal to a baby or young child when ingested.  · Avoid use of all medicines, including herbs, over-the-counter medicines, not prescribed or suggested by your caregiver. Only take over-the-counter or prescription medicines for pain, discomfort, or fever as directed by your caregiver. Do not use aspirin, ibuprofen, or naproxen unless directed by your caregiver.  · Let your caregiver also know about herbs you may be using.  · Alcohol is related to a number of birth defects. This includes fetal alcohol syndrome. All alcohol, in any form, should be avoided completely. Smoking will cause low birth rate and premature babies.  · Street or illegal drugs are very harmful to the baby. They are absolutely forbidden. A baby born to an addicted mother will be addicted at birth. The baby will go through the same withdrawal an adult does.  · Let your caregiver know about any medicines that you have to take and for what reason you take them.  SEEK MEDICAL CARE IF:   You have any concerns or worries during your  pregnancy. It is better to call with your questions if you feel they cannot wait, rather than worry about them.  SEEK IMMEDIATE MEDICAL CARE IF:   · An unexplained oral temperature above 102° F (38.9° C) develops, or as your caregiver suggests.  · You have leaking of fluid from the vagina (birth canal). If leaking membranes are suspected, take your temperature and inform your caregiver of this when you call.  · There is vaginal spotting or bleeding. Notify your caregiver of the amount and how many pads are used.  · You develop a bad smelling vaginal discharge with a change in the color.  · You continue to feel sick to your stomach (nauseated) and have no relief from remedies suggested. You vomit blood or coffee ground-like materials.  · You lose more than 2 pounds of weight in 1 week.  · You gain more than 2 pounds of weight in 1 week and you notice swelling of your face, hands, feet, or legs.  · You gain 5 pounds or more in 1 week (even if you do not have swelling of your hands, face, legs, or feet).  · You get exposed to German measles and have never had them.  · You are exposed to fifth disease or chickenpox.  · You develop belly (abdominal) pain. Round ligament discomfort is a common non-cancerous (benign) cause of abdominal pain in pregnancy. Your caregiver still must evaluate this.  · You develop headache, fever, diarrhea, pain with urination, or shortness of breath.  · You fall or are in a car accident or have any kind of trauma.  · There is mental or physical violence in your home.  Document Released: 10/12/2001 Document Revised: 07/12/2012 Document Reviewed: 04/15/2009  ExitCare® Patient Information ©2014 [employer].

## 2014-01-31 NOTE — Progress Notes (Signed)
Pulse- 83 Patient reports pain in hip

## 2014-02-13 ENCOUNTER — Encounter: Payer: Self-pay | Admitting: *Deleted

## 2014-02-26 ENCOUNTER — Ambulatory Visit (INDEPENDENT_AMBULATORY_CARE_PROVIDER_SITE_OTHER): Payer: Managed Care, Other (non HMO) | Admitting: Obstetrics and Gynecology

## 2014-02-26 ENCOUNTER — Encounter: Payer: Self-pay | Admitting: Obstetrics and Gynecology

## 2014-02-26 VITALS — BP 112/75 | HR 99 | Temp 98.8°F | Wt 136.3 lb

## 2014-02-26 DIAGNOSIS — Z3400 Encounter for supervision of normal first pregnancy, unspecified trimester: Secondary | ICD-10-CM

## 2014-02-26 DIAGNOSIS — Z3401 Encounter for supervision of normal first pregnancy, first trimester: Secondary | ICD-10-CM

## 2014-02-26 LAB — POCT URINALYSIS DIP (DEVICE)
BILIRUBIN URINE: NEGATIVE
Glucose, UA: NEGATIVE mg/dL
Ketones, ur: NEGATIVE mg/dL
NITRITE: NEGATIVE
PH: 6 (ref 5.0–8.0)
Protein, ur: NEGATIVE mg/dL
Specific Gravity, Urine: 1.01 (ref 1.005–1.030)
UROBILINOGEN UA: 1 mg/dL (ref 0.0–1.0)

## 2014-02-26 NOTE — Patient Instructions (Signed)
Second Trimester of Pregnancy The second trimester is from week 13 through week 28, months 4 through 6. The second trimester is often a time when you feel your best. Your body has also adjusted to being pregnant, and you begin to feel better physically. Usually, morning sickness has lessened or quit completely, you may have more energy, and you may have an increase in appetite. The second trimester is also a time when the fetus is growing rapidly. At the end of the sixth month, the fetus is about 9 inches long and weighs about 1 pounds. You will likely begin to feel the baby move (quickening) between 18 and 20 weeks of the pregnancy. BODY CHANGES Your body goes through many changes during pregnancy. The changes vary from woman to woman.   Your weight will continue to increase. You will notice your lower abdomen bulging out.  You may begin to get stretch marks on your hips, abdomen, and breasts.  You may develop headaches that can be relieved by medicines approved by your caregiver.  You may urinate more often because the fetus is pressing on your bladder.  You may develop or continue to have heartburn as a result of your pregnancy.  You may develop constipation because certain hormones are causing the muscles that push waste through your intestines to slow down.  You may develop hemorrhoids or swollen, bulging veins (varicose veins).  You may have back pain because of the weight gain and pregnancy hormones relaxing your joints between the bones in your pelvis and as a result of a shift in weight and the muscles that support your balance.  Your breasts will continue to grow and be tender.  Your gums may bleed and may be sensitive to brushing and flossing.  Dark spots or blotches (chloasma, mask of pregnancy) may develop on your face. This will likely fade after the baby is born.  A dark line from your belly button to the pubic area (linea nigra) may appear. This will likely fade after the  baby is born. WHAT TO EXPECT AT YOUR PRENATAL VISITS During a routine prenatal visit:  You will be weighed to make sure you and the fetus are growing normally.  Your blood pressure will be taken.  Your abdomen will be measured to track your baby's growth.  The fetal heartbeat will be listened to.  Any test results from the previous visit will be discussed. Your caregiver may ask you:  How you are feeling.  If you are feeling the baby move.  If you have had any abnormal symptoms, such as leaking fluid, bleeding, severe headaches, or abdominal cramping.  If you have any questions. Other tests that may be performed during your second trimester include:  Blood tests that check for:  Low iron levels (anemia).  Gestational diabetes (between 24 and 28 weeks).  Rh antibodies.  Urine tests to check for infections, diabetes, or protein in the urine.  An ultrasound to confirm the proper growth and development of the baby.  An amniocentesis to check for possible genetic problems.  Fetal screens for spina bifida and Down syndrome. HOME CARE INSTRUCTIONS   Avoid all smoking, herbs, alcohol, and unprescribed drugs. These chemicals affect the formation and growth of the baby.  Follow your caregiver's instructions regarding medicine use. There are medicines that are either safe or unsafe to take during pregnancy.  Exercise only as directed by your caregiver. Experiencing uterine cramps is a good sign to stop exercising.  Continue to eat regular,   healthy meals.  Wear a good support bra for breast tenderness.  Do not use hot tubs, steam rooms, or saunas.  Wear your seat belt at all times when driving.  Avoid raw meat, uncooked cheese, cat litter boxes, and soil used by cats. These carry germs that can cause birth defects in the baby.  Take your prenatal vitamins.  Try taking a stool softener (if your caregiver approves) if you develop constipation. Eat more high-fiber foods,  such as fresh vegetables or fruit and whole grains. Drink plenty of fluids to keep your urine clear or pale yellow.  Take warm sitz baths to soothe any pain or discomfort caused by hemorrhoids. Use hemorrhoid cream if your caregiver approves.  If you develop varicose veins, wear support hose. Elevate your feet for 15 minutes, 3 4 times a day. Limit salt in your diet.  Avoid heavy lifting, wear low heel shoes, and practice good posture.  Rest with your legs elevated if you have leg cramps or low back pain.  Visit your dentist if you have not gone yet during your pregnancy. Use a soft toothbrush to brush your teeth and be gentle when you floss.  A sexual relationship may be continued unless your caregiver directs you otherwise.  Continue to go to all your prenatal visits as directed by your caregiver. SEEK MEDICAL CARE IF:   You have dizziness.  You have mild pelvic cramps, pelvic pressure, or nagging pain in the abdominal area.  You have persistent nausea, vomiting, or diarrhea.  You have a bad smelling vaginal discharge.  You have pain with urination. SEEK IMMEDIATE MEDICAL CARE IF:   You have a fever.  You are leaking fluid from your vagina.  You have spotting or bleeding from your vagina.  You have severe abdominal cramping or pain.  You have rapid weight gain or loss.  You have shortness of breath with chest pain.  You notice sudden or extreme swelling of your face, hands, ankles, feet, or legs.  You have not felt your baby move in over an hour.  You have severe headaches that do not go away with medicine.  You have vision changes. Document Released: 10/12/2001 Document Revised: 06/20/2013 Document Reviewed: 12/19/2012 ExitCare Patient Information 2014 [employer].  

## 2014-02-26 NOTE — Progress Notes (Signed)
Migraine H/As with aura more frequent and she is "toughing it out" with some Tylenol use. Works [employer]. Schedule with Jannifer RodneyLinda Barefoot, NP asap Reviewed OB labs. Has US scheduled. Quad screen today.

## 2014-02-27 LAB — ALPHA FETOPROTEIN, MATERNAL
AFP: 41.8 IU/mL
CURR GEST AGE: 16.3 wks.days
MoM for AFP: 1.24
Open Spina bifida: NEGATIVE
Osb Risk: 1:6120 {titer}

## 2014-03-19 ENCOUNTER — Other Ambulatory Visit: Payer: Self-pay

## 2014-03-19 ENCOUNTER — Encounter (HOSPITAL_COMMUNITY): Payer: Self-pay

## 2014-03-19 ENCOUNTER — Ambulatory Visit (HOSPITAL_COMMUNITY)
Admission: RE | Admit: 2014-03-19 | Discharge: 2014-03-19 | Disposition: A | Discharge status: Home / Self Care | Payer: Managed Care, Other (non HMO) | Source: Ambulatory Visit | Attending: Obstetrics & Gynecology | Admitting: Obstetrics & Gynecology

## 2014-03-19 ENCOUNTER — Institutional Professional Consult (permissible substitution): Payer: Managed Care, Other (non HMO) | Admitting: Nurse Practitioner

## 2014-03-19 DIAGNOSIS — Z3400 Encounter for supervision of normal first pregnancy, unspecified trimester: Secondary | ICD-10-CM

## 2014-03-19 DIAGNOSIS — Z3689 Encounter for other specified antenatal screening: Secondary | ICD-10-CM | POA: Insufficient documentation

## 2014-03-19 LAB — CHROMOSOMES ANALYSIS FOR CF

## 2014-03-21 LAB — CMV ANTIBODY, IGG (EIA): CMV Ab - IgG: <0.2 U/mL (ref <0.60)

## 2014-03-21 LAB — CMV IGM: CMV IgM: <8 AU/mL (ref <30.00)

## 2014-03-21 LAB — TOXOPLASMA ANTIBODIES- IGG AND  IGM
TOXOPLASMA ANTIBODY- IGM: 4.2 [IU]/mL (ref <8.0)
Toxoplasma IgG Ratio: <3 IU/mL (ref <7.2)

## 2014-03-23 LAB — PARVOVIRUS B19 ANTIBODY, IGG AND IGM
PAROVIRUS B19 IGM ABS: 0.1 {index} (ref <0.9)
Parovirus B19 IgG Abs: 5.2 index — ABNORMAL HIGH (ref <0.9)

## 2014-03-26 ENCOUNTER — Ambulatory Visit (INDEPENDENT_AMBULATORY_CARE_PROVIDER_SITE_OTHER): Payer: Managed Care, Other (non HMO) | Admitting: Advanced Practice Midwife

## 2014-03-26 ENCOUNTER — Telehealth (HOSPITAL_COMMUNITY): Payer: Self-pay | Admitting: *Deleted

## 2014-03-26 ENCOUNTER — Institutional Professional Consult (permissible substitution): Payer: Managed Care, Other (non HMO) | Admitting: Nurse Practitioner

## 2014-03-26 VITALS — BP 115/76 | HR 85 | Temp 98.1°F | Wt 140.9 lb

## 2014-03-26 DIAGNOSIS — Z3401 Encounter for supervision of normal first pregnancy, first trimester: Secondary | ICD-10-CM

## 2014-03-26 DIAGNOSIS — Z3400 Encounter for supervision of normal first pregnancy, unspecified trimester: Secondary | ICD-10-CM

## 2014-03-26 LAB — POCT URINALYSIS DIP (DEVICE)
BILIRUBIN URINE: NEGATIVE
GLUCOSE, UA: NEGATIVE mg/dL
HGB URINE DIPSTICK: NEGATIVE
Ketones, ur: NEGATIVE mg/dL
NITRITE: NEGATIVE
Protein, ur: NEGATIVE mg/dL
Specific Gravity, Urine: 1.025 (ref 1.005–1.030)
UROBILINOGEN UA: 0.2 mg/dL (ref 0.0–1.0)
pH: 6 (ref 5.0–8.0)

## 2014-03-26 NOTE — Progress Notes (Signed)
Patient reports being tired and achy in feet lately.

## 2014-03-26 NOTE — Telephone Encounter (Signed)
Pt verified by name and DOB. Informed patient that CMV, TOXO, Parvo and CF are all WNL.  Cindy Hazy is still pending.  Pt verbalized understanding.

## 2014-03-26 NOTE — Progress Notes (Signed)
Discussed echogenic bowel. F/U US 6/16

## 2014-03-26 NOTE — Patient Instructions (Signed)
Second Trimester of Pregnancy The second trimester is from week 13 through week 28, months 4 through 6. The second trimester is often a time when you feel your best. Your body has also adjusted to being pregnant, and you begin to feel better physically. Usually, morning sickness has lessened or quit completely, you may have more energy, and you may have an increase in appetite. The second trimester is also a time when the fetus is growing rapidly. At the end of the sixth month, the fetus is about 9 inches long and weighs about 1 pounds. You will likely begin to feel the baby move (quickening) between 18 and 20 weeks of the pregnancy. BODY CHANGES Your body goes through many changes during pregnancy. The changes vary from woman to woman.   Your weight will continue to increase. You will notice your lower abdomen bulging out.  You may begin to get stretch marks on your hips, abdomen, and breasts.  You may develop headaches that can be relieved by medicines approved by your caregiver.  You may urinate more often because the fetus is pressing on your bladder.  You may develop or continue to have heartburn as a result of your pregnancy.  You may develop constipation because certain hormones are causing the muscles that push waste through your intestines to slow down.  You may develop hemorrhoids or swollen, bulging veins (varicose veins).  You may have back pain because of the weight gain and pregnancy hormones relaxing your joints between the bones in your pelvis and as a result of a shift in weight and the muscles that support your balance.  Your breasts will continue to grow and be tender.  Your gums may bleed and may be sensitive to brushing and flossing.  Dark spots or blotches (chloasma, mask of pregnancy) may develop on your face. This will likely fade after the baby is born.  A dark line from your belly button to the pubic area (linea nigra) may appear. This will likely fade after  the baby is born. WHAT TO EXPECT AT YOUR PRENATAL VISITS During a routine prenatal visit:  You will be weighed to make sure you and the fetus are growing normally.  Your blood pressure will be taken.  Your abdomen will be measured to track your baby's growth.  The fetal heartbeat will be listened to.  Any test results from the previous visit will be discussed. Your caregiver may ask you:  How you are feeling.  If you are feeling the baby move.  If you have had any abnormal symptoms, such as leaking fluid, bleeding, severe headaches, or abdominal cramping.  If you have any questions. Other tests that may be performed during your second trimester include:  Blood tests that check for:  Low iron levels (anemia).  Gestational diabetes (between 24 and 28 weeks).  Rh antibodies.  Urine tests to check for infections, diabetes, or protein in the urine.  An ultrasound to confirm the proper growth and development of the baby.  An amniocentesis to check for possible genetic problems.  Fetal screens for spina bifida and Down syndrome. HOME CARE INSTRUCTIONS   Avoid all smoking, herbs, alcohol, and unprescribed drugs. These chemicals affect the formation and growth of the baby.  Follow your caregiver's instructions regarding medicine use. There are medicines that are either safe or unsafe to take during pregnancy.  Exercise only as directed by your caregiver. Experiencing uterine cramps is a good sign to stop exercising.  Continue to eat regular,   healthy meals.  Wear a good support bra for breast tenderness.  Do not use hot tubs, steam rooms, or saunas.  Wear your seat belt at all times when driving.  Avoid raw meat, uncooked cheese, cat litter boxes, and soil used by cats. These carry germs that can cause birth defects in the baby.  Take your prenatal vitamins.  Try taking a stool softener (if your caregiver approves) if you develop constipation. Eat more high-fiber  foods, such as fresh vegetables or fruit and whole grains. Drink plenty of fluids to keep your urine clear or pale yellow.  Take warm sitz baths to soothe any pain or discomfort caused by hemorrhoids. Use hemorrhoid cream if your caregiver approves.  If you develop varicose veins, wear support hose. Elevate your feet for 15 minutes, 3 4 times a day. Limit salt in your diet.  Avoid heavy lifting, wear low heel shoes, and practice good posture.  Rest with your legs elevated if you have leg cramps or low back pain.  Visit your dentist if you have not gone yet during your pregnancy. Use a soft toothbrush to brush your teeth and be gentle when you floss.  A sexual relationship may be continued unless your caregiver directs you otherwise.  Continue to go to all your prenatal visits as directed by your caregiver. SEEK MEDICAL CARE IF:   You have dizziness.  You have mild pelvic cramps, pelvic pressure, or nagging pain in the abdominal area.  You have persistent nausea, vomiting, or diarrhea.  You have a bad smelling vaginal discharge.  You have pain with urination. SEEK IMMEDIATE MEDICAL CARE IF:   You have a fever.  You are leaking fluid from your vagina.  You have spotting or bleeding from your vagina.  You have severe abdominal cramping or pain.  You have rapid weight gain or loss.  You have shortness of breath with chest pain.  You notice sudden or extreme swelling of your face, hands, ankles, feet, or legs.  You have not felt your baby move in over an hour.  You have severe headaches that do not go away with medicine.  You have vision changes. Document Released: 10/12/2001 Document Revised: 06/20/2013 Document Reviewed: 12/19/2012 [employer] Patient Information 2014 [employer].  Breastfeeding Deciding to breastfeed is one of the best choices you can make for you and your baby. A change in hormones during pregnancy causes your breast tissue to grow and increases the  number and size of your milk ducts. These hormones also allow proteins, sugars, and fats from your blood supply to make breast milk in your milk-producing glands. Hormones prevent breast milk from being released before your baby is born as well as prompt milk flow after birth. Once breastfeeding has begun, thoughts of your baby, as well as his or her sucking or crying, can stimulate the release of milk from your milk-producing glands.  BENEFITS OF BREASTFEEDING For Your Baby  Your first milk (colostrum) helps your baby's digestive system function better.   There are antibodies in your milk that help your baby fight off infections.   Your baby has a lower incidence of asthma, allergies, and sudden infant death syndrome.   The nutrients in breast milk are better for your baby than infant formulas and are designed uniquely for your baby's needs.   Breast milk improves your baby's brain development.   Your baby is less likely to develop other conditions, such as childhood obesity, asthma, or type 2 diabetes mellitus.  For   You   Breastfeeding helps to create a very special bond between you and your baby.   Breastfeeding is convenient. Breast milk is always available at the correct temperature and costs nothing.   Breastfeeding helps to burn calories and helps you lose the weight gained during pregnancy.   Breastfeeding makes your uterus contract to its prepregnancy size faster and slows bleeding (lochia) after you give birth.   Breastfeeding helps to lower your risk of developing type 2 diabetes mellitus, osteoporosis, and breast or ovarian cancer later in life. SIGNS THAT YOUR BABY IS HUNGRY Early Signs of Hunger  Increased alertness or activity.  Stretching.  Movement of the head from side to side.  Movement of the head and opening of the mouth when the corner of the mouth or cheek is stroked (rooting).  Increased sucking sounds, smacking lips, cooing, sighing, or  squeaking.  Hand-to-mouth movements.  Increased sucking of fingers or hands. Late Signs of Hunger  Fussing.  Intermittent crying. Extreme Signs of Hunger Signs of extreme hunger will require calming and consoling before your baby will be able to breastfeed successfully. Do not wait for the following signs of extreme hunger to occur before you initiate breastfeeding:   Restlessness.  A loud, strong cry.   Screaming. BREASTFEEDING BASICS Breastfeeding Initiation  Find a comfortable place to sit or lie down, with your neck and back well supported.  Place a pillow or rolled up blanket under your baby to bring him or her to the level of your breast (if you are seated). Nursing pillows are specially designed to help support your arms and your baby while you breastfeed.  Make sure that your baby's abdomen is facing your abdomen.   Gently massage your breast. With your fingertips, massage from your chest wall toward your nipple in a circular motion. This encourages milk flow. You may need to continue this action during the feeding if your milk flows slowly.  Support your breast with 4 fingers underneath and your thumb above your nipple. Make sure your fingers are well away from your nipple and your baby's mouth.   Stroke your baby's lips gently with your finger or nipple.   When your baby's mouth is open wide enough, quickly bring your baby to your breast, placing your entire nipple and as much of the colored area around your nipple (areola) as possible into your baby's mouth.   More areola should be visible above your baby's upper lip than below the lower lip.   Your baby's tongue should be between his or her lower gum and your breast.   Ensure that your baby's mouth is correctly positioned around your nipple (latched). Your baby's lips should create a seal on your breast and be turned out (everted).  It is common for your baby to suck about 2 3 minutes in order to start the  flow of breast milk. Latching Teaching your baby how to latch on to your breast properly is very important. An improper latch can cause nipple pain and decreased milk supply for you and poor weight gain in your baby. Also, if your baby is not latched onto your nipple properly, he or she may swallow some air during feeding. This can make your baby fussy. Burping your baby when you switch breasts during the feeding can help to get rid of the air. However, teaching your baby to latch on properly is still the best way to prevent fussiness from swallowing air while breastfeeding. Signs that your baby has   successfully latched on to your nipple:    Silent tugging or silent sucking, without causing you pain.   Swallowing heard between every 3 4 sucks.    Muscle movement above and in front of his or her ears while sucking.  Signs that your baby has not successfully latched on to nipple:   Sucking sounds or smacking sounds from your baby while breastfeeding.  Nipple pain. If you think your baby has not latched on correctly, slip your finger into the corner of your baby's mouth to break the suction and place it between your baby's gums. Attempt breastfeeding initiation again. Signs of Successful Breastfeeding Signs from your baby:   A gradual decrease in the number of sucks or complete cessation of sucking.   Falling asleep.   Relaxation of his or her body.   Retention of a small amount of milk in his or her mouth.   Letting go of your breast by himself or herself. Signs from you:  Breasts that have increased in firmness, weight, and size 1 3 hours after feeding.   Breasts that are softer immediately after breastfeeding.  Increased milk volume, as well as a change in milk consistency and color by the 5th day of breastfeeding.   Nipples that are not sore, cracked, or bleeding. Signs That Your Baby is Getting Enough Milk  Wetting at least 3 diapers in a 24-hour period. The urine  should be clear and pale yellow by age 5 days.  At least 3 stools in a 24-hour period by age 5 days. The stool should be soft and yellow.  At least 3 stools in a 24-hour period by age 7 days. The stool should be seedy and yellow.  No loss of weight greater than 10% of birth weight during the first 3 days of age.  Average weight gain of 4 7 ounces (120 210 mL) per week after age 4 days.  Consistent daily weight gain by age 5 days, without weight loss after the age of 2 weeks. After a feeding, your baby may spit up a small amount. This is common. BREASTFEEDING FREQUENCY AND DURATION Frequent feeding will help you make more milk and can prevent sore nipples and breast engorgement. Breastfeed when you feel the need to reduce the fullness of your breasts or when your baby shows signs of hunger. This is called "breastfeeding on demand." Avoid introducing a pacifier to your baby while you are working to establish breastfeeding (the first 4 6 weeks after your baby is born). After this time you may choose to use a pacifier. Research has shown that pacifier use during the first year of a baby's life decreases the risk of sudden infant death syndrome (SIDS). Allow your baby to feed on each breast as long as he or she wants. Breastfeed until your baby is finished feeding. When your baby unlatches or falls asleep while feeding from the first breast, offer the second breast. Because newborns are often sleepy in the first few weeks of life, you may need to awaken your baby to get him or her to feed. Breastfeeding times will vary from baby to baby. However, the following rules can serve as a guide to help you ensure that your baby is properly fed:  Newborns (babies 4 weeks of age or younger) may breastfeed every 1 3 hours.  Newborns should not go longer than 3 hours during the day or 5 hours during the night without breastfeeding.  You should breastfeed your baby a minimum of   8 times in a 24-hour period until  you begin to introduce solid foods to your baby at around 6 months of age. BREAST MILK PUMPING Pumping and storing breast milk allows you to ensure that your baby is exclusively fed your breast milk, even at times when you are unable to breastfeed. This is especially important if you are going back to work while you are still breastfeeding or when you are not able to be present during feedings. Your lactation consultant can give you guidelines on how long it is safe to store breast milk.  A breast pump is a machine that allows you to pump milk from your breast into a sterile bottle. The pumped breast milk can then be stored in a refrigerator or freezer. Some breast pumps are operated by hand, while others use electricity. Ask your lactation consultant which type will work best for you. Breast pumps can be purchased, but some hospitals and breastfeeding support groups lease breast pumps on a monthly basis. A lactation consultant can teach you how to hand express breast milk, if you prefer not to use a pump.  CARING FOR YOUR BREASTS WHILE YOU BREASTFEED Nipples can become dry, cracked, and sore while breastfeeding. The following recommendations can help keep your breasts moisturized and healthy:  Avoid using soap on your nipples.   Wear a supportive bra. Although not required, special nursing bras and tank tops are designed to allow access to your breasts for breastfeeding without taking off your entire bra or top. Avoid wearing underwire style bras or extremely tight bras.  Air dry your nipples for 3 4minutes after each feeding.   Use only cotton bra pads to absorb leaked breast milk. Leaking of breast milk between feedings is normal.   Use lanolin on your nipples after breastfeeding. Lanolin helps to maintain your skin's normal moisture barrier. If you use pure lanolin you do not need to wash it off before feeding your baby again. Pure lanolin is not toxic to your baby. You may also hand express a  few drops of breast milk and gently massage that milk into your nipples and allow the milk to air dry. In the first few weeks after giving birth, some women experience extremely full breasts (engorgement). Engorgement can make your breasts feel heavy, warm, and tender to the touch. Engorgement peaks within 3 5 days after you give birth. The following recommendations can help ease engorgement:  Completely empty your breasts while breastfeeding or pumping. You may want to start by applying warm, moist heat (in the shower or with warm water-soaked hand towels) just before feeding or pumping. This increases circulation and helps the milk flow. If your baby does not completely empty your breasts while breastfeeding, pump any extra milk after he or she is finished.  Wear a snug bra (nursing or regular) or tank top for 1 2 days to signal your body to slightly decrease milk production.  Apply ice packs to your breasts, unless this is too uncomfortable for you.  Make sure that your baby is latched on and positioned properly while breastfeeding. If engorgement persists after 48 hours of following these recommendations, contact your health care provider or a lactation consultant. OVERALL HEALTH CARE RECOMMENDATIONS WHILE BREASTFEEDING  Eat healthy foods. Alternate between meals and snacks, eating 3 of each per day. Because what you eat affects your breast milk, some of the foods may make your baby more irritable than usual. Avoid eating these foods if you are sure that they are   negatively affecting your baby.  Drink milk, fruit juice, and water to satisfy your thirst (about 10 glasses a day).   Rest often, relax, and continue to take your prenatal vitamins to prevent fatigue, stress, and anemia.  Continue breast self-awareness checks.  Avoid chewing and smoking tobacco.  Avoid alcohol and drug use. Some medicines that may be harmful to your baby can pass through breast milk. It is important to ask your  health care provider before taking any medicine, including all over-the-counter and prescription medicine as well as vitamin and herbal supplements. It is possible to become pregnant while breastfeeding. If birth control is desired, ask your health care provider about options that will be safe for your baby. SEEK MEDICAL CARE IF:   You feel like you want to stop breastfeeding or have become frustrated with breastfeeding.  You have painful breasts or nipples.  Your nipples are cracked or bleeding.  Your breasts are red, tender, or warm.  You have a swollen area on either breast.  You have a fever or chills.  You have nausea or vomiting.  You have drainage other than breast milk from your nipples.  Your breasts do not become full before feedings by the 5th day after you give birth.  You feel sad and depressed.  Your baby is too sleepy to eat well.  Your baby is having trouble sleeping.   Your baby is wetting less than 3 diapers in a 24-hour period.  Your baby has less than 3 stools in a 24-hour period.  Your baby's skin or the white part of his or her eyes becomes yellow.   Your baby is not gaining weight by 5 days of age. SEEK IMMEDIATE MEDICAL CARE IF:   Your baby is overly tired (lethargic) and does not want to wake up and feed.  Your baby develops an unexplained fever. Document Released: 10/18/2005 Document Revised: 06/20/2013 Document Reviewed: 04/11/2013 [employer] Patient Information 2014 [employer].  

## 2014-03-29 ENCOUNTER — Telehealth (HOSPITAL_COMMUNITY): Payer: Self-pay | Admitting: MS"

## 2014-03-29 NOTE — Telephone Encounter (Signed)
Called [name] to discuss her cell free fetal DNA test results.  Ms. Alyssa Gutierrez had Panorama testing through [facility].  Testing was offered because of ultrasound finding of echogenic bowel.   The patient was identified by name and DOB.  We reviewed that these are within normal limits, showing a less than 1 in 10,000 risk for trisomies 21, 18 and 13, and monosomy X (Turner syndrome).  In addition, the risk for triploidy/vanishing twin and sex chromosome trisomies (47,XXX and 47,XXY) was also low risk.  We reviewed that this testing identifies > 99% of pregnancies with trisomy 59, trisomy 81, sex chromosome trisomies (47,XXX and 47,XXY), and triploidy. The detection rate for trisomy 18 is 96%.  The detection rate for monosomy X is ~92%.  The false positive rate is <0.1% for all conditions. Testing was also consistent with female fetal sex.  She understands that this testing does not identify all genetic conditions.    Additionally, reviewed that cystic fibrosis carrier screening was normal/negative for the mutations analyzed. Thus, her risk to be a CF carrier has been reduced but not eliminated. All questions were answered to her satisfaction, she was encouraged to call with additional questions or concerns.  [name], MS Certified Genetic Counselor

## 2014-04-02 ENCOUNTER — Telehealth: Payer: Self-pay | Admitting: *Deleted

## 2014-04-02 DIAGNOSIS — O283 Abnormal ultrasonic finding on antenatal screening of mother: Secondary | ICD-10-CM

## 2014-04-02 NOTE — Telephone Encounter (Signed)
Message copied by Dorothyann Peng on Tue Apr 02, 2014 11:17 AM ------      Message from: Adam Phenix      Created: Tue Apr 02, 2014 10:45 AM       Need 4 week f/u US ------

## 2014-04-02 NOTE — Telephone Encounter (Signed)
Appointment for follow up ultrasound scheduled for April 16, 2014 @ 0830 This was already scheduled, pt is aware. Left message to confirm this appointment.

## 2014-04-16 ENCOUNTER — Ambulatory Visit (HOSPITAL_COMMUNITY): Payer: Managed Care, Other (non HMO)

## 2014-04-16 ENCOUNTER — Encounter: Payer: Self-pay | Admitting: General Practice

## 2014-04-16 ENCOUNTER — Ambulatory Visit (HOSPITAL_COMMUNITY)
Admission: RE | Admit: 2014-04-16 | Discharge: 2014-04-16 | Disposition: A | Discharge status: Home / Self Care | Payer: Managed Care, Other (non HMO) | Source: Ambulatory Visit | Attending: Family Medicine | Admitting: Family Medicine

## 2014-04-16 ENCOUNTER — Other Ambulatory Visit (HOSPITAL_COMMUNITY): Payer: Self-pay | Admitting: Maternal and Fetal Medicine

## 2014-04-16 VITALS — BP 106/76 | HR 100 | Wt 144.5 lb

## 2014-04-16 DIAGNOSIS — O289 Unspecified abnormal findings on antenatal screening of mother: Secondary | ICD-10-CM | POA: Insufficient documentation

## 2014-04-16 DIAGNOSIS — O283 Abnormal ultrasonic finding on antenatal screening of mother: Secondary | ICD-10-CM

## 2014-04-23 ENCOUNTER — Encounter: Payer: Self-pay | Admitting: Advanced Practice Midwife

## 2014-04-23 ENCOUNTER — Ambulatory Visit (INDEPENDENT_AMBULATORY_CARE_PROVIDER_SITE_OTHER): Payer: Managed Care, Other (non HMO) | Admitting: Advanced Practice Midwife

## 2014-04-23 VITALS — BP 108/67 | HR 90 | Temp 98.3°F | Wt 146.4 lb

## 2014-04-23 DIAGNOSIS — O289 Unspecified abnormal findings on antenatal screening of mother: Secondary | ICD-10-CM

## 2014-04-23 DIAGNOSIS — O283 Abnormal ultrasonic finding on antenatal screening of mother: Secondary | ICD-10-CM

## 2014-04-23 DIAGNOSIS — Z3400 Encounter for supervision of normal first pregnancy, unspecified trimester: Secondary | ICD-10-CM

## 2014-04-23 DIAGNOSIS — Z3401 Encounter for supervision of normal first pregnancy, first trimester: Secondary | ICD-10-CM

## 2014-04-23 LAB — POCT URINALYSIS DIP (DEVICE)
BILIRUBIN URINE: NEGATIVE
Glucose, UA: NEGATIVE mg/dL
NITRITE: NEGATIVE
PH: 6 (ref 5.0–8.0)
PROTEIN: NEGATIVE mg/dL
Specific Gravity, Urine: 1.025 (ref 1.005–1.030)
Urobilinogen, UA: 1 mg/dL (ref 0.0–1.0)

## 2014-04-23 NOTE — Patient Instructions (Signed)
Second Trimester of Pregnancy The second trimester is from week 13 through week 28, months 4 through 6. The second trimester is often a time when you feel your best. Your body has also adjusted to being pregnant, and you begin to feel better physically. Usually, morning sickness has lessened or quit completely, you may have more energy, and you may have an increase in appetite. The second trimester is also a time when the fetus is growing rapidly. At the end of the sixth month, the fetus is about 9 inches long and weighs about 1 pounds. You will likely begin to feel the baby move (quickening) between 18 and 20 weeks of the pregnancy. BODY CHANGES Your body goes through many changes during pregnancy. The changes vary from woman to woman.   Your weight will continue to increase. You will notice your lower abdomen bulging out.  You may begin to get stretch marks on your hips, abdomen, and breasts.  You may develop headaches that can be relieved by medicines approved by your health care Ameerah Huffstetler.  You may urinate more often because the fetus is pressing on your bladder.  You may develop or continue to have heartburn as a result of your pregnancy.  You may develop constipation because certain hormones are causing the muscles that push waste through your intestines to slow down.  You may develop hemorrhoids or swollen, bulging veins (varicose veins).  You may have back pain because of the weight gain and pregnancy hormones relaxing your joints between the bones in your pelvis and as a result of a shift in weight and the muscles that support your balance.  Your breasts will continue to grow and be tender.  Your gums may bleed and may be sensitive to brushing and flossing.  Dark spots or blotches (chloasma, mask of pregnancy) may develop on your face. This will likely fade after the baby is born.  A dark line from your belly button to the pubic area (linea nigra) may appear. This will likely fade  after the baby is born.  You may have changes in your hair. These can include thickening of your hair, rapid growth, and changes in texture. Some women also have hair loss during or after pregnancy, or hair that feels dry or thin. Your hair will most likely return to normal after your baby is born. WHAT TO EXPECT AT YOUR PRENATAL VISITS During a routine prenatal visit:  You will be weighed to make sure you and the fetus are growing normally.  Your blood pressure will be taken.  Your abdomen will be measured to track your baby's growth.  The fetal heartbeat will be listened to.  Any test results from the previous visit will be discussed. Your health care Nixxon Faria may ask you:  How you are feeling.  If you are feeling the baby move.  If you have had any abnormal symptoms, such as leaking fluid, bleeding, severe headaches, or abdominal cramping.  If you have any questions. Other tests that may be performed during your second trimester include:  Blood tests that check for:  Low iron levels (anemia).  Gestational diabetes (between 24 and 28 weeks).  Rh antibodies.  Urine tests to check for infections, diabetes, or protein in the urine.  An ultrasound to confirm the proper growth and development of the baby.  An amniocentesis to check for possible genetic problems.  Fetal screens for spina bifida and Down syndrome. HOME CARE INSTRUCTIONS   Avoid all smoking, herbs, alcohol, and unprescribed   drugs. These chemicals affect the formation and growth of the baby.  Follow your health care Keirstyn Aydt's instructions regarding medicine use. There are medicines that are either safe or unsafe to take during pregnancy.  Exercise only as directed by your health care Dnyla Antonetti. Experiencing uterine cramps is a good sign to stop exercising.  Continue to eat regular, healthy meals.  Wear a good support bra for breast tenderness.  Do not use hot tubs, steam rooms, or saunas.  Wear your  seat belt at all times when driving.  Avoid raw meat, uncooked cheese, cat litter boxes, and soil used by cats. These carry germs that can cause birth defects in the baby.  Take your prenatal vitamins.  Try taking a stool softener (if your health care Rieley Hausman approves) if you develop constipation. Eat more high-fiber foods, such as fresh vegetables or fruit and whole grains. Drink plenty of fluids to keep your urine clear or pale yellow.  Take warm sitz baths to soothe any pain or discomfort caused by hemorrhoids. Use hemorrhoid cream if your health care Mende Biswell approves.  If you develop varicose veins, wear support hose. Elevate your feet for 15 minutes, 3-4 times a day. Limit salt in your diet.  Avoid heavy lifting, wear low heel shoes, and practice good posture.  Rest with your legs elevated if you have leg cramps or low back pain.  Visit your dentist if you have not gone yet during your pregnancy. Use a soft toothbrush to brush your teeth and be gentle when you floss.  A sexual relationship may be continued unless your health care Cordaryl Decelles directs you otherwise.  Continue to go to all your prenatal visits as directed by your health care Annalissa Murphey. SEEK MEDICAL CARE IF:   You have dizziness.  You have mild pelvic cramps, pelvic pressure, or nagging pain in the abdominal area.  You have persistent nausea, vomiting, or diarrhea.  You have a bad smelling vaginal discharge.  You have pain with urination. SEEK IMMEDIATE MEDICAL CARE IF:   You have a fever.  You are leaking fluid from your vagina.  You have spotting or bleeding from your vagina.  You have severe abdominal cramping or pain.  You have rapid weight gain or loss.  You have shortness of breath with chest pain.  You notice sudden or extreme swelling of your face, hands, ankles, feet, or legs.  You have not felt your baby move in over an hour.  You have severe headaches that do not go away with  medicine.  You have vision changes. Document Released: 10/12/2001 Document Revised: 10/23/2013 Document Reviewed: 12/19/2012 [employer] Patient Information 2015 [employer]. This information is not intended to replace advice given to you by your health care Jayliana Valencia. Make sure you discuss any questions you have with your health care Fleming Prill.  

## 2014-04-23 NOTE — Progress Notes (Signed)
Reports pain right side of belly button.

## 2014-04-23 NOTE — Progress Notes (Signed)
Doing well, except some pain to right of umbilicus. Has a slight diastasis above umbilicus, but no hernia palpable. May try pregnancy belt

## 2014-04-29 ENCOUNTER — Encounter: Payer: Self-pay | Admitting: General Practice

## 2014-05-08 ENCOUNTER — Telehealth: Payer: Self-pay | Admitting: *Deleted

## 2014-05-08 NOTE — Telephone Encounter (Addendum)
Pt left message stating that she woke up last night with a very sharp pain in her upper Rt abdomen. She does not think it was a gas pain or contraction. The pain kept her up for an hour. She wants to know what caused the pain. I returned pt's call and discussed her concern. I stated that I cannot say for sure what caused the pain. I confirmed that she is not having any vaginal bleeding, the baby is moving well and she is not having pain @ present. I advised pt that it may have been related to the position of the baby or a contraction. If this happens again, I recommended that she change her position to see if that will cause the baby to change position as well. If she develops pain again which does not subside or has heavy vaginal bleeding, she should go to [facility] for evaluation. Pt voiced understanding.

## 2014-05-22 ENCOUNTER — Ambulatory Visit (INDEPENDENT_AMBULATORY_CARE_PROVIDER_SITE_OTHER): Payer: Managed Care, Other (non HMO) | Admitting: Advanced Practice Midwife

## 2014-05-22 VITALS — BP 121/76 | HR 102 | Temp 98.4°F | Wt 154.4 lb

## 2014-05-22 DIAGNOSIS — Z3403 Encounter for supervision of normal first pregnancy, third trimester: Secondary | ICD-10-CM

## 2014-05-22 DIAGNOSIS — Z23 Encounter for immunization: Secondary | ICD-10-CM

## 2014-05-22 DIAGNOSIS — Z3400 Encounter for supervision of normal first pregnancy, unspecified trimester: Secondary | ICD-10-CM

## 2014-05-22 LAB — POCT URINALYSIS DIP (DEVICE)
Bilirubin Urine: NEGATIVE
Glucose, UA: NEGATIVE mg/dL
Hgb urine dipstick: NEGATIVE
Ketones, ur: NEGATIVE mg/dL
Nitrite: NEGATIVE
PROTEIN: NEGATIVE mg/dL
Specific Gravity, Urine: 1.02 (ref 1.005–1.030)
UROBILINOGEN UA: 1 mg/dL (ref 0.0–1.0)
pH: 6.5 (ref 5.0–8.0)

## 2014-05-22 LAB — RPR

## 2014-05-22 LAB — CBC
HCT: 35.3 % — ABNORMAL LOW (ref 36.0–46.0)
Hemoglobin: 11.9 g/dL — ABNORMAL LOW (ref 12.0–15.0)
MCH: 29.6 pg (ref 26.0–34.0)
MCHC: 33.7 g/dL (ref 30.0–36.0)
MCV: 87.8 fL (ref 78.0–100.0)
PLATELETS: 254 10*3/uL (ref 150–400)
RBC: 4.02 MIL/uL (ref 3.87–5.11)
RDW: 13.5 % (ref 11.5–15.5)
WBC: 12 10*3/uL — ABNORMAL HIGH (ref 4.0–10.5)

## 2014-05-22 MED ORDER — TETANUS-DIPHTH-ACELL PERTUSSIS 5-2.5-18.5 LF-MCG/0.5 IM SUSP: 0.5000 mL | Freq: Once | INTRAMUSCULAR | Status: DC

## 2014-05-22 NOTE — Progress Notes (Signed)
Wants to have a waterbirth. Has class scheduled.  Discussed tub options. [facility]  Lives [place]. Agree with note.

## 2014-05-22 NOTE — Patient Instructions (Signed)
It was a pleasure seeing you today!  Information regarding what we discussed is included in this packet.  Please feel free to call our office if any questions or concerns arise.  Third Trimester of Pregnancy The third trimester is from week 29 through week 42, months 7 through 9. This trimester is when your unborn baby (fetus) is growing very fast. At the end of the ninth month, the unborn baby is about 20 inches in length. It weighs about 6-10 pounds.  HOME CARE   Avoid all smoking, herbs, and alcohol. Avoid drugs not approved by your doctor.  Only take medicine as told by your doctor. Some medicines are safe and some are not during pregnancy.  Exercise only as told by your doctor. Stop exercising if you start having cramps.  Eat regular, healthy meals.  Wear a good support bra if your breasts are tender.  Do not use hot tubs, steam rooms, or saunas.  Wear your seat belt when driving.  Avoid raw meat, uncooked cheese, and liter boxes and soil used by cats.  Take your prenatal vitamins.  Try taking medicine that helps you poop (stool softener) as needed, and if your doctor approves. Eat more fiber by eating fresh fruit, vegetables, and whole grains. Drink enough fluids to keep your pee (urine) clear or pale yellow.  Take warm water baths (sitz baths) to soothe pain or discomfort caused by hemorrhoids. Use hemorrhoid cream if your doctor approves.  If you have puffy, bulging veins (varicose veins), wear support hose. Raise (elevate) your feet for 15 minutes, 3-4 times a day. Limit salt in your diet.  Avoid heavy lifting, wear low heels, and sit up straight.  Rest with your legs raised if you have leg cramps or low back pain.  Visit your dentist if you have not gone during your pregnancy. Use a soft toothbrush to brush your teeth. Be gentle when you floss.  You can have sex (intercourse) unless your doctor tells you not to.  Do not travel far distances unless you must. Only do so  with your doctor's approval.  Take prenatal classes.  Practice driving to the hospital.  Pack your hospital bag.  Prepare the baby's room.  Go to your doctor visits. GET HELP IF:  You are not sure if you are in labor or if your water has broken.  You are dizzy.  You have mild cramps or pressure in your lower belly (abdominal).  You have a nagging pain in your belly area.  You continue to feel sick to your stomach (nauseous), throw up (vomit), or have watery poop (diarrhea).  You have bad smelling fluid coming from your vagina.  You have pain with peeing (urination). GET HELP RIGHT AWAY IF:   You have a fever.  You are leaking fluid from your vagina.  You are spotting or bleeding from your vagina.  You have severe belly cramping or pain.  You lose or gain weight rapidly.  You have trouble catching your breath and have chest pain.  You notice sudden or extreme puffiness (swelling) of your face, hands, ankles, feet, or legs.  You have not felt the baby move in over an hour.  You have severe headaches that do not go away with medicine.  You have vision changes. Document Released: 01/12/2010 Document Revised: 02/12/2013 Document Reviewed: 12/19/2012 [facility] Patient Information 2015 [place]. This information is not intended to replace advice given to you by your health care provider. Make sure you discuss any  questions you have with your health care provider.  

## 2014-05-22 NOTE — Progress Notes (Signed)
Patient reports continued pain around umbilicus and also states she feels her heart racing sometimes when she isn't even doing anything

## 2014-05-22 NOTE — Progress Notes (Signed)
Patient continues to have pain around her belly button.  She also states that she had 2 episodes of "racing heart" over the weekend at rest.  She also needs an RX for breast pump.  +FM, no vaginal bleeding, occ yellow vaginal discharge (patient believes coloration to be d/t PNV's), occ cramps (maybe once daily that lasts a few mins and resolves) in the pelvis but no overt belly pain  RX Breast pump Water birth discussed

## 2014-05-23 ENCOUNTER — Encounter: Payer: Self-pay | Admitting: Advanced Practice Midwife

## 2014-05-23 LAB — HIV ANTIBODY (ROUTINE TESTING W REFLEX): HIV: NONREACTIVE

## 2014-05-23 LAB — GLUCOSE TOLERANCE, 1 HOUR (50G) W/O FASTING: GLUCOSE 1 HOUR GTT: 97 mg/dL (ref 70–140)

## 2014-05-27 ENCOUNTER — Telehealth: Payer: Self-pay | Admitting: General Practice

## 2014-05-27 NOTE — Telephone Encounter (Signed)
Patient called and left message that she would like her test results from Wednesday about the one hour. Called patient and informed her of normal one hour. Patient verbalized understanding and had no questions

## 2014-05-28 ENCOUNTER — Encounter (HOSPITAL_COMMUNITY): Payer: Self-pay

## 2014-05-28 ENCOUNTER — Ambulatory Visit (HOSPITAL_COMMUNITY)
Admission: RE | Admit: 2014-05-28 | Discharge: 2014-05-28 | Disposition: A | Discharge status: Home / Self Care | Payer: Managed Care, Other (non HMO) | Source: Ambulatory Visit | Attending: Advanced Practice Midwife | Admitting: Advanced Practice Midwife

## 2014-05-28 VITALS — BP 122/70 | HR 98 | Wt 155.0 lb

## 2014-05-28 DIAGNOSIS — O283 Abnormal ultrasonic finding on antenatal screening of mother: Secondary | ICD-10-CM

## 2014-05-28 DIAGNOSIS — O289 Unspecified abnormal findings on antenatal screening of mother: Secondary | ICD-10-CM | POA: Insufficient documentation

## 2014-05-28 LAB — US OB FOLLOW UP

## 2014-06-05 ENCOUNTER — Encounter: Payer: Self-pay | Admitting: Family Medicine

## 2014-06-06 ENCOUNTER — Other Ambulatory Visit: Payer: Managed Care, Other (non HMO)

## 2014-06-11 ENCOUNTER — Ambulatory Visit (INDEPENDENT_AMBULATORY_CARE_PROVIDER_SITE_OTHER): Payer: Managed Care, Other (non HMO) | Admitting: Family Medicine

## 2014-06-11 ENCOUNTER — Encounter: Payer: Self-pay | Admitting: Family Medicine

## 2014-06-11 VITALS — BP 109/66 | HR 83 | Wt 159.4 lb

## 2014-06-11 DIAGNOSIS — Z3401 Encounter for supervision of normal first pregnancy, first trimester: Secondary | ICD-10-CM

## 2014-06-11 DIAGNOSIS — Z3400 Encounter for supervision of normal first pregnancy, unspecified trimester: Secondary | ICD-10-CM

## 2014-06-11 MED ORDER — BREAST PUMP MISC
Status: DC
Start: 2014-06-11 — End: 2014-08-07

## 2014-06-11 NOTE — Progress Notes (Signed)
Normal 28 wk labs Wants partner to have TDaP--will schedule Waterbirth class next week.

## 2014-06-11 NOTE — Patient Instructions (Signed)
Third Trimester of Pregnancy The third trimester is from week 29 through week 42, months 7 through 9. The third trimester is a time when the fetus is growing rapidly. At the end of the ninth month, the fetus is about 20 inches in length and weighs 6-10 pounds.  BODY CHANGES Your body goes through many changes during pregnancy. The changes vary from woman to woman.   Your weight will continue to increase. You can expect to gain 25-35 pounds (11-16 kg) by the end of the pregnancy.  You may begin to get stretch marks on your hips, abdomen, and breasts.  You may urinate more often because the fetus is moving lower into your pelvis and pressing on your bladder.  You may develop or continue to have heartburn as a result of your pregnancy.  You may develop constipation because certain hormones are causing the muscles that push waste through your intestines to slow down.  You may develop hemorrhoids or swollen, bulging veins (varicose veins).  You may have pelvic pain because of the weight gain and pregnancy hormones relaxing your joints between the bones in your pelvis. Backaches may result from overexertion of the muscles supporting your posture.  You may have changes in your hair. These can include thickening of your hair, rapid growth, and changes in texture. Some women also have hair loss during or after pregnancy, or hair that feels dry or thin. Your hair will most likely return to normal after your baby is born.  Your breasts will continue to grow and be tender. A yellow discharge may leak from your breasts called colostrum.  Your belly button may stick out.  You may feel short of breath because of your expanding uterus.  You may notice the fetus "dropping," or moving lower in your abdomen.  You may have a bloody mucus discharge. This usually occurs a few days to a week before labor begins.  Your cervix becomes thin and soft (effaced) near your due date. WHAT TO EXPECT AT YOUR  PRENATAL EXAMS  You will have prenatal exams every 2 weeks until week 36. Then, you will have weekly prenatal exams. During a routine prenatal visit:  You will be weighed to make sure you and the fetus are growing normally.  Your blood pressure is taken.  Your abdomen will be measured to track your baby's growth.  The fetal heartbeat will be listened to.  Any test results from the previous visit will be discussed.  You may have a cervical check near your due date to see if you have effaced. At around 36 weeks, your caregiver will check your cervix. At the same time, your caregiver will also perform a test on the secretions of the vaginal tissue. This test is to determine if a type of bacteria, Group B streptococcus, is present. Your caregiver will explain this further. Your caregiver may ask you:  What your birth plan is.  How you are feeling.  If you are feeling the baby move.  If you have had any abnormal symptoms, such as leaking fluid, bleeding, severe headaches, or abdominal cramping.  If you have any questions. Other tests or screenings that may be performed during your third trimester include:  Blood tests that check for low iron levels (anemia).  Fetal testing to check the health, activity level, and growth of the fetus. Testing is done if you have certain medical conditions or if there are problems during the pregnancy. FALSE LABOR You may feel small, irregular contractions that   eventually go away. These are called Braxton Hicks contractions, or false labor. Contractions may last for hours, days, or even weeks before true labor sets in. If contractions come at regular intervals, intensify, or become painful, it is best to be seen by your caregiver.  SIGNS OF LABOR   Menstrual-like cramps.  Contractions that are 5 minutes apart or less.  Contractions that start on the top of the uterus and spread down to the lower abdomen and back.  A sense of increased pelvic  pressure or back pain.  A watery or bloody mucus discharge that comes from the vagina. If you have any of these signs before the 37th week of pregnancy, call your caregiver right away. You need to go to the hospital to get checked immediately. HOME CARE INSTRUCTIONS   Avoid all smoking, herbs, alcohol, and unprescribed drugs. These chemicals affect the formation and growth of the baby.  Follow your caregiver's instructions regarding medicine use. There are medicines that are either safe or unsafe to take during pregnancy.  Exercise only as directed by your caregiver. Experiencing uterine cramps is a good sign to stop exercising.  Continue to eat regular, healthy meals.  Wear a good support bra for breast tenderness.  Do not use hot tubs, steam rooms, or saunas.  Wear your seat belt at all times when driving.  Avoid raw meat, uncooked cheese, cat litter boxes, and soil used by cats. These carry germs that can cause birth defects in the baby.  Take your prenatal vitamins.  Try taking a stool softener (if your caregiver approves) if you develop constipation. Eat more high-fiber foods, such as fresh vegetables or fruit and whole grains. Drink plenty of fluids to keep your urine clear or pale yellow.  Take warm sitz baths to soothe any pain or discomfort caused by hemorrhoids. Use hemorrhoid cream if your caregiver approves.  If you develop varicose veins, wear support hose. Elevate your feet for 15 minutes, 3-4 times a day. Limit salt in your diet.  Avoid heavy lifting, wear low heal shoes, and practice good posture.  Rest a lot with your legs elevated if you have leg cramps or low back pain.  Visit your dentist if you have not gone during your pregnancy. Use a soft toothbrush to brush your teeth and be gentle when you floss.  A sexual relationship may be continued unless your caregiver directs you otherwise.  Do not travel far distances unless it is absolutely necessary and only  with the approval of your caregiver.  Take prenatal classes to understand, practice, and ask questions about the labor and delivery.  Make a trial run to the hospital.  Pack your hospital bag.  Prepare the baby's nursery.  Continue to go to all your prenatal visits as directed by your caregiver. SEEK MEDICAL CARE IF:  You are unsure if you are in labor or if your water has broken.  You have dizziness.  You have mild pelvic cramps, pelvic pressure, or nagging pain in your abdominal area.  You have persistent nausea, vomiting, or diarrhea.  You have a bad smelling vaginal discharge.  You have pain with urination. SEEK IMMEDIATE MEDICAL CARE IF:   You have a fever.  You are leaking fluid from your vagina.  You have spotting or bleeding from your vagina.  You have severe abdominal cramping or pain.  You have rapid weight loss or gain.  You have shortness of breath with chest pain.  You notice sudden or extreme swelling   of your face, hands, ankles, feet, or legs.  You have not felt your baby move in over an hour.  You have severe headaches that do not go away with medicine.  You have vision changes. Document Released: 10/12/2001 Document Revised: 10/23/2013 Document Reviewed: 12/19/2012 [employer] Patient Information 2015 [employer]. This information is not intended to replace advice given to you by your health care provider. Make sure you discuss any questions you have with your health care provider.  Contraception Choices Contraception (birth control) is the use of any methods or devices to prevent pregnancy. Below are some methods to help avoid pregnancy. HORMONAL METHODS   Contraceptive implant. This is a thin, plastic tube containing progesterone hormone. It does not contain estrogen hormone. Your health care provider inserts the tube in the inner part of the upper arm. The tube can remain in place for up to 3 years. After 3 years, the implant must be removed.  The implant prevents the ovaries from releasing an egg (ovulation), thickens the cervical mucus to prevent sperm from entering the uterus, and thins the lining of the inside of the uterus.  Progesterone-only injections. These injections are given every 3 months by your health care provider to prevent pregnancy. This synthetic progesterone hormone stops the ovaries from releasing eggs. It also thickens cervical mucus and changes the uterine lining. This makes it harder for sperm to survive in the uterus.  Birth control pills. These pills contain estrogen and progesterone hormone. They work by preventing the ovaries from releasing eggs (ovulation). They also cause the cervical mucus to thicken, preventing the sperm from entering the uterus. Birth control pills are prescribed by a health care provider.Birth control pills can also be used to treat heavy periods.  Minipill. This type of birth control pill contains only the progesterone hormone. They are taken every day of each month and must be prescribed by your health care provider.  Birth control patch. The patch contains hormones similar to those in birth control pills. It must be changed once a week and is prescribed by a health care provider.  Vaginal ring. The ring contains hormones similar to those in birth control pills. It is left in the vagina for 3 weeks, removed for 1 week, and then a new one is put back in place. The patient must be comfortable inserting and removing the ring from the vagina.A health care provider's prescription is necessary.  Emergency contraception. Emergency contraceptives prevent pregnancy after unprotected sexual intercourse. This pill can be taken right after sex or up to 5 days after unprotected sex. It is most effective the sooner you take the pills after having sexual intercourse. Most emergency contraceptive pills are available without a prescription. Check with your pharmacist. Do not use emergency contraception as  your only form of birth control. BARRIER METHODS   Female condom. This is a thin sheath (latex or rubber) that is worn over the penis during sexual intercourse. It can be used with spermicide to increase effectiveness.  Female condom. This is a soft, loose-fitting sheath that is put into the vagina before sexual intercourse.  Diaphragm. This is a soft, latex, dome-shaped barrier that must be fitted by a health care provider. It is inserted into the vagina, along with a spermicidal jelly. It is inserted before intercourse. The diaphragm should be left in the vagina for 6 to 8 hours after intercourse.  Cervical cap. This is a round, soft, latex or plastic cup that fits over the cervix and must be   fitted by a health care provider. The cap can be left in place for up to 48 hours after intercourse.  Sponge. This is a soft, circular piece of polyurethane foam. The sponge has spermicide in it. It is inserted into the vagina after wetting it and before sexual intercourse.  Spermicides. These are chemicals that kill or block sperm from entering the cervix and uterus. They come in the form of creams, jellies, suppositories, foam, or tablets. They do not require a prescription. They are inserted into the vagina with an applicator before having sexual intercourse. The process must be repeated every time you have sexual intercourse. INTRAUTERINE CONTRACEPTION  Intrauterine device (IUD). This is a T-shaped device that is put in a woman's uterus during a menstrual period to prevent pregnancy. There are 2 types:  Copper IUD. This type of IUD is wrapped in copper wire and is placed inside the uterus. Copper makes the uterus and fallopian tubes produce a fluid that kills sperm. It can stay in place for 10 years.  Hormone IUD. This type of IUD contains the hormone progestin (synthetic progesterone). The hormone thickens the cervical mucus and prevents sperm from entering the uterus, and it also thins the uterine  lining to prevent implantation of a fertilized egg. The hormone can weaken or kill the sperm that get into the uterus. It can stay in place for 3-5 years, depending on which type of IUD is used. PERMANENT METHODS OF CONTRACEPTION  Female tubal ligation. This is when the woman's fallopian tubes are surgically sealed, tied, or blocked to prevent the egg from traveling to the uterus.  Hysteroscopic sterilization. This involves placing a small coil or insert into each fallopian tube. Your doctor uses a technique called hysteroscopy to do the procedure. The device causes scar tissue to form. This results in permanent blockage of the fallopian tubes, so the sperm cannot fertilize the egg. It takes about 3 months after the procedure for the tubes to become blocked. You must use another form of birth control for these 3 months.  Female sterilization. This is when the female has the tubes that carry sperm tied off (vasectomy).This blocks sperm from entering the vagina during sexual intercourse. After the procedure, the man can still ejaculate fluid (semen). NATURAL PLANNING METHODS  Natural family planning. This is not having sexual intercourse or using a barrier method (condom, diaphragm, cervical cap) on days the woman could become pregnant.  Calendar method. This is keeping track of the length of each menstrual cycle and identifying when you are fertile.  Ovulation method. This is avoiding sexual intercourse during ovulation.  Symptothermal method. This is avoiding sexual intercourse during ovulation, using a thermometer and ovulation symptoms.  Post-ovulation method. This is timing sexual intercourse after you have ovulated. Regardless of which type or method of contraception you choose, it is important that you use condoms to protect against the transmission of sexually transmitted infections (STIs). Talk with your health care provider about which form of contraception is most appropriate for  you. Document Released: 10/18/2005 Document Revised: 10/23/2013 Document Reviewed: 04/12/2013 [employer] Patient Information 2015 [employer]. This information is not intended to replace advice given to you by your health care provider. Make sure you discuss any questions you have with your health care provider.  Breastfeeding Deciding to breastfeed is one of the best choices you can make for you and your baby. A change in hormones during pregnancy causes your breast tissue to grow and increases the number and size of   your milk ducts. These hormones also allow proteins, sugars, and fats from your blood supply to make breast milk in your milk-producing glands. Hormones prevent breast milk from being released before your baby is born as well as prompt milk flow after birth. Once breastfeeding has begun, thoughts of your baby, as well as his or her sucking or crying, can stimulate the release of milk from your milk-producing glands.  BENEFITS OF BREASTFEEDING For Your Baby  Your first milk (colostrum) helps your baby's digestive system function better.   There are antibodies in your milk that help your baby fight off infections.   Your baby has a lower incidence of asthma, allergies, and sudden infant death syndrome.   The nutrients in breast milk are better for your baby than infant formulas and are designed uniquely for your baby's needs.   Breast milk improves your baby's brain development.   Your baby is less likely to develop other conditions, such as childhood obesity, asthma, or type 2 diabetes mellitus.  For You   Breastfeeding helps to create a very special bond between you and your baby.   Breastfeeding is convenient. Breast milk is always available at the correct temperature and costs nothing.   Breastfeeding helps to burn calories and helps you lose the weight gained during pregnancy.   Breastfeeding makes your uterus contract to its prepregnancy size faster and slows  bleeding (lochia) after you give birth.   Breastfeeding helps to lower your risk of developing type 2 diabetes mellitus, osteoporosis, and breast or ovarian cancer later in life. SIGNS THAT YOUR BABY IS HUNGRY Early Signs of Hunger  Increased alertness or activity.  Stretching.  Movement of the head from side to side.  Movement of the head and opening of the mouth when the corner of the mouth or cheek is stroked (rooting).  Increased sucking sounds, smacking lips, cooing, sighing, or squeaking.  Hand-to-mouth movements.  Increased sucking of fingers or hands. Late Signs of Hunger  Fussing.  Intermittent crying. Extreme Signs of Hunger Signs of extreme hunger will require calming and consoling before your baby will be able to breastfeed successfully. Do not wait for the following signs of extreme hunger to occur before you initiate breastfeeding:   Restlessness.  A loud, strong cry.   Screaming. BREASTFEEDING BASICS Breastfeeding Initiation  Find a comfortable place to sit or lie down, with your neck and back well supported.  Place a pillow or rolled up blanket under your baby to bring him or her to the level of your breast (if you are seated). Nursing pillows are specially designed to help support your arms and your baby while you breastfeed.  Make sure that your baby's abdomen is facing your abdomen.   Gently massage your breast. With your fingertips, massage from your chest wall toward your nipple in a circular motion. This encourages milk flow. You may need to continue this action during the feeding if your milk flows slowly.  Support your breast with 4 fingers underneath and your thumb above your nipple. Make sure your fingers are well away from your nipple and your baby's mouth.   Stroke your baby's lips gently with your finger or nipple.   When your baby's mouth is open wide enough, quickly bring your baby to your breast, placing your entire nipple and as  much of the colored area around your nipple (areola) as possible into your baby's mouth.   More areola should be visible above your baby's upper lip than below   the lower lip.   Your baby's tongue should be between his or her lower gum and your breast.   Ensure that your baby's mouth is correctly positioned around your nipple (latched). Your baby's lips should create a seal on your breast and be turned out (everted).  It is common for your baby to suck about 2-3 minutes in order to start the flow of breast milk. Latching Teaching your baby how to latch on to your breast properly is very important. An improper latch can cause nipple pain and decreased milk supply for you and poor weight gain in your baby. Also, if your baby is not latched onto your nipple properly, he or she may swallow some air during feeding. This can make your baby fussy. Burping your baby when you switch breasts during the feeding can help to get rid of the air. However, teaching your baby to latch on properly is still the best way to prevent fussiness from swallowing air while breastfeeding. Signs that your baby has successfully latched on to your nipple:    Silent tugging or silent sucking, without causing you pain.   Swallowing heard between every 3-4 sucks.    Muscle movement above and in front of his or her ears while sucking.  Signs that your baby has not successfully latched on to nipple:   Sucking sounds or smacking sounds from your baby while breastfeeding.  Nipple pain. If you think your baby has not latched on correctly, slip your finger into the corner of your baby's mouth to break the suction and place it between your baby's gums. Attempt breastfeeding initiation again. Signs of Successful Breastfeeding Signs from your baby:   A gradual decrease in the number of sucks or complete cessation of sucking.   Falling asleep.   Relaxation of his or her body.   Retention of a small amount of milk in  his or her mouth.   Letting go of your breast by himself or herself. Signs from you:  Breasts that have increased in firmness, weight, and size 1-3 hours after feeding.   Breasts that are softer immediately after breastfeeding.  Increased milk volume, as well as a change in milk consistency and color by the fifth day of breastfeeding.   Nipples that are not sore, cracked, or bleeding. Signs That Your Baby is Getting Enough Milk  Wetting at least 3 diapers in a 24-hour period. The urine should be clear and pale yellow by age 5 days.  At least 3 stools in a 24-hour period by age 5 days. The stool should be soft and yellow.  At least 3 stools in a 24-hour period by age 7 days. The stool should be seedy and yellow.  No loss of weight greater than 10% of birth weight during the first 3 days of age.  Average weight gain of 4-7 ounces (113-198 g) per week after age 4 days.  Consistent daily weight gain by age 5 days, without weight loss after the age of 2 weeks. After a feeding, your baby may spit up a small amount. This is common. BREASTFEEDING FREQUENCY AND DURATION Frequent feeding will help you make more milk and can prevent sore nipples and breast engorgement. Breastfeed when you feel the need to reduce the fullness of your breasts or when your baby shows signs of hunger. This is called "breastfeeding on demand." Avoid introducing a pacifier to your baby while you are working to establish breastfeeding (the first 4-6 weeks after your baby is born).   After this time you may choose to use a pacifier. Research has shown that pacifier use during the first year of a baby's life decreases the risk of sudden infant death syndrome (SIDS). Allow your baby to feed on each breast as long as he or she wants. Breastfeed until your baby is finished feeding. When your baby unlatches or falls asleep while feeding from the first breast, offer the second breast. Because newborns are often sleepy in the  first few weeks of life, you may need to awaken your baby to get him or her to feed. Breastfeeding times will vary from baby to baby. However, the following rules can serve as a guide to help you ensure that your baby is properly fed:  Newborns (babies 4 weeks of age or younger) may breastfeed every 1-3 hours.  Newborns should not go longer than 3 hours during the day or 5 hours during the night without breastfeeding.  You should breastfeed your baby a minimum of 8 times in a 24-hour period until you begin to introduce solid foods to your baby at around 6 months of age. BREAST MILK PUMPING Pumping and storing breast milk allows you to ensure that your baby is exclusively fed your breast milk, even at times when you are unable to breastfeed. This is especially important if you are going back to work while you are still breastfeeding or when you are not able to be present during feedings. Your lactation consultant can give you guidelines on how long it is safe to store breast milk.  A breast pump is a machine that allows you to pump milk from your breast into a sterile bottle. The pumped breast milk can then be stored in a refrigerator or freezer. Some breast pumps are operated by hand, while others use electricity. Ask your lactation consultant which type will work best for you. Breast pumps can be purchased, but some hospitals and breastfeeding support groups lease breast pumps on a monthly basis. A lactation consultant can teach you how to hand express breast milk, if you prefer not to use a pump.  CARING FOR YOUR BREASTS WHILE YOU BREASTFEED Nipples can become dry, cracked, and sore while breastfeeding. The following recommendations can help keep your breasts moisturized and healthy:  Avoid using soap on your nipples.   Wear a supportive bra. Although not required, special nursing bras and tank tops are designed to allow access to your breasts for breastfeeding without taking off your entire bra  or top. Avoid wearing underwire-style bras or extremely tight bras.  Air dry your nipples for 3-4minutes after each feeding.   Use only cotton bra pads to absorb leaked breast milk. Leaking of breast milk between feedings is normal.   Use lanolin on your nipples after breastfeeding. Lanolin helps to maintain your skin's normal moisture barrier. If you use pure lanolin, you do not need to wash it off before feeding your baby again. Pure lanolin is not toxic to your baby. You may also hand express a few drops of breast milk and gently massage that milk into your nipples and allow the milk to air dry. In the first few weeks after giving birth, some women experience extremely full breasts (engorgement). Engorgement can make your breasts feel heavy, warm, and tender to the touch. Engorgement peaks within 3-5 days after you give birth. The following recommendations can help ease engorgement:  Completely empty your breasts while breastfeeding or pumping. You may want to start by applying warm, moist heat (in   the shower or with warm water-soaked hand towels) just before feeding or pumping. This increases circulation and helps the milk flow. If your baby does not completely empty your breasts while breastfeeding, pump any extra milk after he or she is finished.  Wear a snug bra (nursing or regular) or tank top for 1-2 days to signal your body to slightly decrease milk production.  Apply ice packs to your breasts, unless this is too uncomfortable for you.  Make sure that your baby is latched on and positioned properly while breastfeeding. If engorgement persists after 48 hours of following these recommendations, contact your health care provider or a lactation consultant. OVERALL HEALTH CARE RECOMMENDATIONS WHILE BREASTFEEDING  Eat healthy foods. Alternate between meals and snacks, eating 3 of each per day. Because what you eat affects your breast milk, some of the foods may make your baby more irritable  than usual. Avoid eating these foods if you are sure that they are negatively affecting your baby.  Drink milk, fruit juice, and water to satisfy your thirst (about 10 glasses a day).   Rest often, relax, and continue to take your prenatal vitamins to prevent fatigue, stress, and anemia.  Continue breast self-awareness checks.  Avoid chewing and smoking tobacco.  Avoid alcohol and drug use. Some medicines that may be harmful to your baby can pass through breast milk. It is important to ask your health care provider before taking any medicine, including all over-the-counter and prescription medicine as well as vitamin and herbal supplements. It is possible to become pregnant while breastfeeding. If birth control is desired, ask your health care provider about options that will be safe for your baby. SEEK MEDICAL CARE IF:   You feel like you want to stop breastfeeding or have become frustrated with breastfeeding.  You have painful breasts or nipples.  Your nipples are cracked or bleeding.  Your breasts are red, tender, or warm.  You have a swollen area on either breast.  You have a fever or chills.  You have nausea or vomiting.  You have drainage other than breast milk from your nipples.  Your breasts do not become full before feedings by the fifth day after you give birth.  You feel sad and depressed.  Your baby is too sleepy to eat well.  Your baby is having trouble sleeping.   Your baby is wetting less than 3 diapers in a 24-hour period.  Your baby has less than 3 stools in a 24-hour period.  Your baby's skin or the white part of his or her eyes becomes yellow.   Your baby is not gaining weight by 5 days of age. SEEK IMMEDIATE MEDICAL CARE IF:   Your baby is overly tired (lethargic) and does not want to wake up and feed.  Your baby develops an unexplained fever. Document Released: 10/18/2005 Document Revised: 10/23/2013 Document Reviewed: 04/11/2013 ExitCare  Patient Information 2015 [employer]. This information is not intended to replace advice given to you by your health care provider. Make sure you discuss any questions you have with your health care provider.  

## 2014-06-11 NOTE — Addendum Note (Signed)
Addended by: Barbara CowerNOGUES, Garik Diamant L on: 06/11/2014 04:14 PM   Modules accepted: Orders

## 2014-06-11 NOTE — Progress Notes (Signed)
Patient continues to have racing heart episodes, when sitting and resting.  She also needs a prescription for a breast pump.

## 2014-06-19 ENCOUNTER — Encounter: Payer: Managed Care, Other (non HMO) | Admitting: Family Medicine

## 2014-06-24 ENCOUNTER — Ambulatory Visit (INDEPENDENT_AMBULATORY_CARE_PROVIDER_SITE_OTHER): Payer: Managed Care, Other (non HMO) | Admitting: Obstetrics & Gynecology

## 2014-06-24 VITALS — BP 106/68 | HR 96 | Wt 164.0 lb

## 2014-06-24 DIAGNOSIS — Z3401 Encounter for supervision of normal first pregnancy, first trimester: Secondary | ICD-10-CM

## 2014-06-24 DIAGNOSIS — O283 Abnormal ultrasonic finding on antenatal screening of mother: Secondary | ICD-10-CM

## 2014-06-24 DIAGNOSIS — R82998 Other abnormal findings in urine: Secondary | ICD-10-CM

## 2014-06-24 DIAGNOSIS — R8290 Unspecified abnormal findings in urine: Secondary | ICD-10-CM

## 2014-06-24 DIAGNOSIS — Z3400 Encounter for supervision of normal first pregnancy, unspecified trimester: Secondary | ICD-10-CM

## 2014-06-24 NOTE — Progress Notes (Signed)
Attended waterbirth class on 06/19/14.  Next growth scan is scheduled 06/25/14 due to persistent echogenic bowel. Pain is epigastric, recommended antacids.  If persists, may need imaging. Abnormal UA with mod LE, no symptoms. Will send urine culture. No other complaints or concerns.  Labor and fetal movement precautions reviewed.

## 2014-06-24 NOTE — Progress Notes (Signed)
Moderate leuks, has a "burning" pain in upper abdomen. Completed water birth class, certificate scanned.

## 2014-06-24 NOTE — Patient Instructions (Signed)
Return to clinic for any obstetric concerns or go to [facility] for evaluation  

## 2014-06-25 ENCOUNTER — Encounter (HOSPITAL_COMMUNITY): Payer: Self-pay

## 2014-06-25 ENCOUNTER — Ambulatory Visit (HOSPITAL_COMMUNITY)
Admission: RE | Admit: 2014-06-25 | Discharge: 2014-06-25 | Disposition: A | Discharge status: Home / Self Care | Payer: Managed Care, Other (non HMO) | Source: Ambulatory Visit | Attending: Obstetrics & Gynecology | Admitting: Obstetrics & Gynecology

## 2014-06-25 ENCOUNTER — Telehealth: Payer: Self-pay

## 2014-06-25 DIAGNOSIS — Z13220 Encounter for screening for lipoid disorders: Secondary | ICD-10-CM

## 2014-06-25 DIAGNOSIS — Z3689 Encounter for other specified antenatal screening: Secondary | ICD-10-CM | POA: Insufficient documentation

## 2014-06-25 DIAGNOSIS — O289 Unspecified abnormal findings on antenatal screening of mother: Secondary | ICD-10-CM | POA: Diagnosis present

## 2014-06-25 DIAGNOSIS — O283 Abnormal ultrasonic finding on antenatal screening of mother: Secondary | ICD-10-CM

## 2014-06-25 NOTE — Telephone Encounter (Signed)
appt scheduled

## 2014-06-25 NOTE — Telephone Encounter (Signed)
I ordered it  Please schedule fasting labs

## 2014-06-25 NOTE — Telephone Encounter (Signed)
Pt left v/m; pt needs biometric screening for ins at work; pt being seen for prenatal care and only test pt is missing is lipid panel; pt request order to have lab done at [facility].Please advise.

## 2014-06-26 LAB — CULTURE, OB URINE

## 2014-07-01 ENCOUNTER — Other Ambulatory Visit: Payer: Managed Care, Other (non HMO)

## 2014-07-04 ENCOUNTER — Other Ambulatory Visit: Payer: Managed Care, Other (non HMO)

## 2014-07-05 ENCOUNTER — Ambulatory Visit: Payer: Managed Care, Other (non HMO)

## 2014-07-05 ENCOUNTER — Telehealth: Payer: Self-pay | Admitting: *Deleted

## 2014-07-05 ENCOUNTER — Other Ambulatory Visit (INDEPENDENT_AMBULATORY_CARE_PROVIDER_SITE_OTHER): Payer: Managed Care, Other (non HMO)

## 2014-07-05 DIAGNOSIS — Z13220 Encounter for screening for lipoid disorders: Secondary | ICD-10-CM

## 2014-07-05 DIAGNOSIS — Z131 Encounter for screening for diabetes mellitus: Secondary | ICD-10-CM

## 2014-07-05 DIAGNOSIS — R7989 Other specified abnormal findings of blood chemistry: Secondary | ICD-10-CM

## 2014-07-05 LAB — LDL CHOLESTEROL, DIRECT: Direct LDL: 173.8 mg/dL

## 2014-07-05 LAB — GLUCOSE, RANDOM: Glucose, Bld: 85 mg/dL (ref 70–99)

## 2014-07-05 LAB — LIPID PANEL
Cholesterol: 247 mg/dL — ABNORMAL HIGH (ref 0–200)
HDL: 58.8 mg/dL (ref >39.00)
NonHDL: 188.2
TRIGLYCERIDES: 220 mg/dL — AB (ref 0.0–149.0)
Total CHOL/HDL Ratio: 4
VLDL: 44 mg/dL — ABNORMAL HIGH (ref 0.0–40.0)

## 2014-07-05 NOTE — Telephone Encounter (Signed)
Will fill out form once results are in

## 2014-07-05 NOTE — Telephone Encounter (Signed)
Please have her come in for nurse visit for finger stick glucose check when able (fasting if that is required)

## 2014-07-05 NOTE — Telephone Encounter (Signed)
Add on request for glucose was sent to the lab.

## 2014-07-05 NOTE — Telephone Encounter (Signed)
Pt doesn't need to come in for a finger stick, per Rodney Booze she can add on that order to the tube of blood she drew, sent this message to Dr. Milinda Antis and Rodney Booze

## 2014-07-05 NOTE — Telephone Encounter (Addendum)
Pt came in for labs for her employee insurance screening. She needs a lipid and glucose, but we only had orders for the lipid. Can you order the glucose? She brought in a form that needs those results and recent bp, weight. She had an OB/GYN visit a week ago, and I will enter in her vitals from that visit. Pt is [redacted] weeks pregnant and plans a f/u with you after giving birth. Form is in your inbox.

## 2014-07-05 NOTE — Telephone Encounter (Signed)
Please add in glucose when it returns In IN box

## 2014-07-09 NOTE — Telephone Encounter (Signed)
Form faxed and pt notified and copy left at front desk for pt to pick-up

## 2014-07-10 ENCOUNTER — Encounter: Payer: Self-pay | Admitting: Advanced Practice Midwife

## 2014-07-10 ENCOUNTER — Ambulatory Visit (INDEPENDENT_AMBULATORY_CARE_PROVIDER_SITE_OTHER): Payer: Managed Care, Other (non HMO) | Admitting: Advanced Practice Midwife

## 2014-07-10 VITALS — BP 120/82 | HR 107 | Wt 168.0 lb

## 2014-07-10 DIAGNOSIS — Z3400 Encounter for supervision of normal first pregnancy, unspecified trimester: Secondary | ICD-10-CM

## 2014-07-10 DIAGNOSIS — Z3401 Encounter for supervision of normal first pregnancy, first trimester: Secondary | ICD-10-CM

## 2014-07-10 NOTE — Progress Notes (Signed)
Doing well.  Good fetal movement, denies vaginal bleeding, LOF, regular contractions.  Answered pt questions about waterbirth.  Will collect GBS next week.

## 2014-07-17 ENCOUNTER — Encounter: Payer: Self-pay | Admitting: Advanced Practice Midwife

## 2014-07-17 ENCOUNTER — Ambulatory Visit (INDEPENDENT_AMBULATORY_CARE_PROVIDER_SITE_OTHER): Payer: Managed Care, Other (non HMO) | Admitting: Advanced Practice Midwife

## 2014-07-17 ENCOUNTER — Other Ambulatory Visit: Payer: Self-pay | Admitting: Advanced Practice Midwife

## 2014-07-17 VITALS — BP 125/78 | HR 91 | Wt 169.0 lb

## 2014-07-17 DIAGNOSIS — Z3401 Encounter for supervision of normal first pregnancy, first trimester: Secondary | ICD-10-CM

## 2014-07-17 DIAGNOSIS — Z3493 Encounter for supervision of normal pregnancy, unspecified, third trimester: Secondary | ICD-10-CM

## 2014-07-17 DIAGNOSIS — Z113 Encounter for screening for infections with a predominantly sexual mode of transmission: Secondary | ICD-10-CM

## 2014-07-17 DIAGNOSIS — Z3400 Encounter for supervision of normal first pregnancy, unspecified trimester: Secondary | ICD-10-CM

## 2014-07-17 LAB — OB RESULTS CONSOLE GC/CHLAMYDIA
CHLAMYDIA, DNA PROBE: NEGATIVE
Gonorrhea: NEGATIVE

## 2014-07-17 LAB — OB RESULTS CONSOLE GBS: GBS: NEGATIVE

## 2014-07-17 NOTE — Progress Notes (Signed)
Doing well. Needs to sign waterbirth consent. Has everything except liner, getting it this week. States they scanned her class certificate last week. Cultures done today

## 2014-07-17 NOTE — Patient Instructions (Signed)
Third Trimester of Pregnancy The third trimester is from week 29 through week 42, months 7 through 9. The third trimester is a time when the fetus is growing rapidly. At the end of the ninth month, the fetus is about 20 inches in length and weighs 6-10 pounds.  BODY CHANGES Your body goes through many changes during pregnancy. The changes vary from woman to woman.   Your weight will continue to increase. You can expect to gain 25-35 pounds (11-16 kg) by the end of the pregnancy.  You may begin to get stretch marks on your hips, abdomen, and breasts.  You may urinate more often because the fetus is moving lower into your pelvis and pressing on your bladder.  You may develop or continue to have heartburn as a result of your pregnancy.  You may develop constipation because certain hormones are causing the muscles that push waste through your intestines to slow down.  You may develop hemorrhoids or swollen, bulging veins (varicose veins).  You may have pelvic pain because of the weight gain and pregnancy hormones relaxing your joints between the bones in your pelvis. Backaches may result from overexertion of the muscles supporting your posture.  You may have changes in your hair. These can include thickening of your hair, rapid growth, and changes in texture. Some women also have hair loss during or after pregnancy, or hair that feels dry or thin. Your hair will most likely return to normal after your baby is born.  Your breasts will continue to grow and be tender. A yellow discharge may leak from your breasts called colostrum.  Your belly button may stick out.  You may feel short of breath because of your expanding uterus.  You may notice the fetus "dropping," or moving lower in your abdomen.  You may have a bloody mucus discharge. This usually occurs a few days to a week before labor begins.  Your cervix becomes thin and soft (effaced) near your due date. WHAT TO EXPECT AT YOUR PRENATAL  EXAMS  You will have prenatal exams every 2 weeks until week 36. Then, you will have weekly prenatal exams. During a routine prenatal visit:  You will be weighed to make sure you and the fetus are growing normally.  Your blood pressure is taken.  Your abdomen will be measured to track your baby's growth.  The fetal heartbeat will be listened to.  Any test results from the previous visit will be discussed.  You may have a cervical check near your due date to see if you have effaced. At around 36 weeks, your caregiver will check your cervix. At the same time, your caregiver will also perform a test on the secretions of the vaginal tissue. This test is to determine if a type of bacteria, Group B streptococcus, is present. Your caregiver will explain this further. Your caregiver may ask you:  What your birth plan is.  How you are feeling.  If you are feeling the baby move.  If you have had any abnormal symptoms, such as leaking fluid, bleeding, severe headaches, or abdominal cramping.  If you have any questions. Other tests or screenings that may be performed during your third trimester include:  Blood tests that check for low iron levels (anemia).  Fetal testing to check the health, activity level, and growth of the fetus. Testing is done if you have certain medical conditions or if there are problems during the pregnancy. FALSE LABOR You may feel small, irregular contractions that   eventually go away. These are called Braxton Hicks contractions, or false labor. Contractions may last for hours, days, or even weeks before true labor sets in. If contractions come at regular intervals, intensify, or become painful, it is best to be seen by your caregiver.  SIGNS OF LABOR   Menstrual-like cramps.  Contractions that are 5 minutes apart or less.  Contractions that start on the top of the uterus and spread down to the lower abdomen and back.  A sense of increased pelvic pressure or back  pain.  A watery or bloody mucus discharge that comes from the vagina. If you have any of these signs before the 37th week of pregnancy, call your caregiver right away. You need to go to the hospital to get checked immediately. HOME CARE INSTRUCTIONS   Avoid all smoking, herbs, alcohol, and unprescribed drugs. These chemicals affect the formation and growth of the baby.  Follow your caregiver's instructions regarding medicine use. There are medicines that are either safe or unsafe to take during pregnancy.  Exercise only as directed by your caregiver. Experiencing uterine cramps is a good sign to stop exercising.  Continue to eat regular, healthy meals.  Wear a good support bra for breast tenderness.  Do not use hot tubs, steam rooms, or saunas.  Wear your seat belt at all times when driving.  Avoid raw meat, uncooked cheese, cat litter boxes, and soil used by cats. These carry germs that can cause birth defects in the baby.  Take your prenatal vitamins.  Try taking a stool softener (if your caregiver approves) if you develop constipation. Eat more high-fiber foods, such as fresh vegetables or fruit and whole grains. Drink plenty of fluids to keep your urine clear or pale yellow.  Take warm sitz baths to soothe any pain or discomfort caused by hemorrhoids. Use hemorrhoid cream if your caregiver approves.  If you develop varicose veins, wear support hose. Elevate your feet for 15 minutes, 3-4 times a day. Limit salt in your diet.  Avoid heavy lifting, wear low heal shoes, and practice good posture.  Rest a lot with your legs elevated if you have leg cramps or low back pain.  Visit your dentist if you have not gone during your pregnancy. Use a soft toothbrush to brush your teeth and be gentle when you floss.  A sexual relationship may be continued unless your caregiver directs you otherwise.  Do not travel far distances unless it is absolutely necessary and only with the approval  of your caregiver.  Take prenatal classes to understand, practice, and ask questions about the labor and delivery.  Make a trial run to the hospital.  Pack your hospital bag.  Prepare the baby's nursery.  Continue to go to all your prenatal visits as directed by your caregiver. SEEK MEDICAL CARE IF:  You are unsure if you are in labor or if your water has broken.  You have dizziness.  You have mild pelvic cramps, pelvic pressure, or nagging pain in your abdominal area.  You have persistent nausea, vomiting, or diarrhea.  You have a bad smelling vaginal discharge.  You have pain with urination. SEEK IMMEDIATE MEDICAL CARE IF:   You have a fever.  You are leaking fluid from your vagina.  You have spotting or bleeding from your vagina.  You have severe abdominal cramping or pain.  You have rapid weight loss or gain.  You have shortness of breath with chest pain.  You notice sudden or extreme swelling   of your face, hands, ankles, feet, or legs.  You have not felt your baby move in over an hour.  You have severe headaches that do not go away with medicine.  You have vision changes. Document Released: 10/12/2001 Document Revised: 10/23/2013 Document Reviewed: 12/19/2012 Exit[employer] Patient Information 2015 [employer]. This information is not intended to replace advice given to you by your health care provider. Make sure you discuss any questions you have with your health care provider.  

## 2014-07-19 LAB — CULTURE, BETA STREP (GROUP B ONLY)

## 2014-07-20 ENCOUNTER — Encounter (HOSPITAL_COMMUNITY): Payer: Self-pay | Admitting: Advanced Practice Midwife

## 2014-07-20 LAB — GC/CHLAMYDIA PROBE AMP
CT PROBE, AMP APTIMA: NEGATIVE
GC Probe RNA: NEGATIVE

## 2014-07-22 NOTE — Telephone Encounter (Signed)
Left voicemail letting pt know that form was faxed as well as I received a conformation fax saying it went through, I did refax it as well as advise pt that a copy of the form was at the front desk for her to pick up if she hasn't already

## 2014-07-22 NOTE — Telephone Encounter (Signed)
Patient calling stating that her job did not receive the faxed results, pt wanted to verify that the form was faxed, please advise.

## 2014-07-24 ENCOUNTER — Ambulatory Visit (INDEPENDENT_AMBULATORY_CARE_PROVIDER_SITE_OTHER): Payer: Managed Care, Other (non HMO) | Admitting: Advanced Practice Midwife

## 2014-07-24 ENCOUNTER — Encounter: Payer: Self-pay | Admitting: Advanced Practice Midwife

## 2014-07-24 VITALS — BP 123/79 | HR 82 | Wt 170.2 lb

## 2014-07-24 DIAGNOSIS — O358XX1 Maternal care for other (suspected) fetal abnormality and damage, fetus 1: Secondary | ICD-10-CM

## 2014-07-24 DIAGNOSIS — O3090 Multiple gestation, unspecified, unspecified trimester: Secondary | ICD-10-CM

## 2014-07-24 DIAGNOSIS — O358XX0 Maternal care for other (suspected) fetal abnormality and damage, not applicable or unspecified: Secondary | ICD-10-CM

## 2014-07-24 DIAGNOSIS — Z3400 Encounter for supervision of normal first pregnancy, unspecified trimester: Secondary | ICD-10-CM

## 2014-07-24 DIAGNOSIS — Z3401 Encounter for supervision of normal first pregnancy, first trimester: Secondary | ICD-10-CM

## 2014-07-24 NOTE — Progress Notes (Signed)
Waterbirth consent signed. Growth [place] ordered. AC> 97 EFW 85% at 33 week [place]. Discussed increased risk of shoulder dystocia.

## 2014-07-24 NOTE — Patient Instructions (Signed)
Braxton Hicks Contractions Contractions of the uterus can occur throughout pregnancy. Contractions are not always a sign that you are in labor.  WHAT ARE BRAXTON HICKS CONTRACTIONS?  Contractions that occur before labor are called Braxton Hicks contractions, or false labor. Toward the end of pregnancy (32-34 weeks), these contractions can develop more often and may become more forceful. This is not true labor because these contractions do not result in opening (dilatation) and thinning of the cervix. They are sometimes difficult to tell apart from true labor because these contractions can be forceful and people have different pain tolerances. You should not feel embarrassed if you go to the hospital with false labor. Sometimes, the only way to tell if you are in true labor is for your health care provider to look for changes in the cervix. If there are no prenatal problems or other health problems associated with the pregnancy, it is completely safe to be sent home with false labor and await the onset of true labor. HOW CAN YOU TELL THE DIFFERENCE BETWEEN TRUE AND FALSE LABOR? False Labor  The contractions of false labor are usually shorter and not as hard as those of true labor.   The contractions are usually irregular.   The contractions are often felt in the front of the lower abdomen and in the groin.   The contractions may go away when you walk around or change positions while lying down.   The contractions get weaker and are shorter lasting as time goes on.   The contractions do not usually become progressively stronger, regular, and closer together as with true labor.  True Labor  Contractions in true labor last 30-70 seconds, become very regular, usually become more intense, and increase in frequency.   The contractions do not go away with walking.   The discomfort is usually felt in the top of the uterus and spreads to the lower abdomen and low back.   True labor can be  determined by your health care provider with an exam. This will show that the cervix is dilating and getting thinner.  WHAT TO REMEMBER  Keep up with your usual exercises and follow other instructions given by your health care provider.   Take medicines as directed by your health care provider.   Keep your regular prenatal appointments.   Eat and drink lightly if you think you are going into labor.   If Braxton Hicks contractions are making you uncomfortable:   Change your position from lying down or resting to walking, or from walking to resting.   Sit and rest in a tub of warm water.   Drink 2-3 glasses of water. Dehydration may cause these contractions.   Do slow and deep breathing several times an hour.  WHEN SHOULD I SEEK IMMEDIATE MEDICAL CARE? Seek immediate medical care if:  Your contractions become stronger, more regular, and closer together.   You have fluid leaking or gushing from your vagina.   You have a fever.   You pass blood-tinged mucus.   You have vaginal bleeding.   You have continuous abdominal pain.   You have low back pain that you never had before.   You feel your baby's head pushing down and causing pelvic pressure.   Your baby is not moving as much as it used to.  Document Released: 10/18/2005 Document Revised: 10/23/2013 Document Reviewed: 07/30/2013 Exit[employer] Patient Information 2015 [employer]. This information is not intended to replace advice given to you by your health care   provider. Make sure you discuss any questions you have with your health care provider.  Fetal Movement Counts Patient Name: __________________________________________________ Patient Due Date: ____________________ Performing a fetal movement count is highly recommended in high-risk pregnancies, but it is good for every pregnant woman to do. Your health care provider may ask you to start counting fetal movements at 28 weeks of the pregnancy. Fetal  movements often increase:  After eating a full meal.  After physical activity.  After eating or drinking something sweet or cold.  At rest. Pay attention to when you feel the baby is most active. This will help you notice a pattern of your baby's sleep and wake cycles and what factors contribute to an increase in fetal movement. It is important to perform a fetal movement count at the same time each day when your baby is normally most active.  HOW TO COUNT FETAL MOVEMENTS 1. Find a quiet and comfortable area to sit or lie down on your left side. Lying on your left side provides the best blood and oxygen circulation to your baby. 2. Write down the day and time on a sheet of paper or in a journal. 3. Start counting kicks, flutters, swishes, rolls, or jabs in a 2-hour period. You should feel at least 10 movements within 2 hours. 4. If you do not feel 10 movements in 2 hours, wait 2-3 hours and count again. Look for a change in the pattern or not enough counts in 2 hours. SEEK MEDICAL CARE IF:  You feel less than 10 counts in 2 hours, tried twice.  There is no movement in over an hour.  The pattern is changing or taking longer each day to reach 10 counts in 2 hours.  You feel the baby is not moving as he or she usually does. Date: ____________ Movements: ____________ Start time: ____________ Finish time: ____________  Date: ____________ Movements: ____________ Start time: ____________ Finish time: ____________ Date: ____________ Movements: ____________ Start time: ____________ Finish time: ____________ Date: ____________ Movements: ____________ Start time: ____________ Finish time: ____________ Date: ____________ Movements: ____________ Start time: ____________ Finish time: ____________ Date: ____________ Movements: ____________ Start time: ____________ Finish time: ____________ Date: ____________ Movements: ____________ Start time: ____________ Finish time: ____________ Date: ____________  Movements: ____________ Start time: ____________ Finish time: ____________  Date: ____________ Movements: ____________ Start time: ____________ Finish time: ____________ Date: ____________ Movements: ____________ Start time: ____________ Finish time: ____________ Date: ____________ Movements: ____________ Start time: ____________ Finish time: ____________ Date: ____________ Movements: ____________ Start time: ____________ Finish time: ____________ Date: ____________ Movements: ____________ Start time: ____________ Finish time: ____________ Date: ____________ Movements: ____________ Start time: ____________ Finish time: ____________ Date: ____________ Movements: ____________ Start time: ____________ Finish time: ____________  Date: ____________ Movements: ____________ Start time: ____________ Finish time: ____________ Date: ____________ Movements: ____________ Start time: ____________ Finish time: ____________ Date: ____________ Movements: ____________ Start time: ____________ Finish time: ____________ Date: ____________ Movements: ____________ Start time: ____________ Finish time: ____________ Date: ____________ Movements: ____________ Start time: ____________ Finish time: ____________ Date: ____________ Movements: ____________ Start time: ____________ Finish time: ____________ Date: ____________ Movements: ____________ Start time: ____________ Finish time: ____________  Date: ____________ Movements: ____________ Start time: ____________ Finish time: ____________ Date: ____________ Movements: ____________ Start time: ____________ Finish time: ____________ Date: ____________ Movements: ____________ Start time: ____________ Finish time: ____________ Date: ____________ Movements: ____________ Start time: ____________ Finish time: ____________ Date: ____________ Movements: ____________ Start time: ____________ Finish time: ____________ Date: ____________ Movements: ____________ Start time:  ____________ Finish time: ____________ Date: ____________ Movements:   ____________ Start time: ____________ Finish time: ____________  Date: ____________ Movements: ____________ Start time: ____________ Finish time: ____________ Date: ____________ Movements: ____________ Start time: ____________ Finish time: ____________ Date: ____________ Movements: ____________ Start time: ____________ Finish time: ____________ Date: ____________ Movements: ____________ Start time: ____________ Finish time: ____________ Date: ____________ Movements: ____________ Start time: ____________ Finish time: ____________ Date: ____________ Movements: ____________ Start time: ____________ Finish time: ____________ Date: ____________ Movements: ____________ Start time: ____________ Finish time: ____________  Date: ____________ Movements: ____________ Start time: ____________ Finish time: ____________ Date: ____________ Movements: ____________ Start time: ____________ Finish time: ____________ Date: ____________ Movements: ____________ Start time: ____________ Finish time: ____________ Date: ____________ Movements: ____________ Start time: ____________ Finish time: ____________ Date: ____________ Movements: ____________ Start time: ____________ Finish time: ____________ Date: ____________ Movements: ____________ Start time: ____________ Finish time: ____________ Date: ____________ Movements: ____________ Start time: ____________ Finish time: ____________  Date: ____________ Movements: ____________ Start time: ____________ Finish time: ____________ Date: ____________ Movements: ____________ Start time: ____________ Finish time: ____________ Date: ____________ Movements: ____________ Start time: ____________ Finish time: ____________ Date: ____________ Movements: ____________ Start time: ____________ Finish time: ____________ Date: ____________ Movements: ____________ Start time: ____________ Finish time: ____________ Date:  ____________ Movements: ____________ Start time: ____________ Finish time: ____________ Date: ____________ Movements: ____________ Start time: ____________ Finish time: ____________  Date: ____________ Movements: ____________ Start time: ____________ Finish time: ____________ Date: ____________ Movements: ____________ Start time: ____________ Finish time: ____________ Date: ____________ Movements: ____________ Start time: ____________ Finish time: ____________ Date: ____________ Movements: ____________ Start time: ____________ Finish time: ____________ Date: ____________ Movements: ____________ Start time: ____________ Finish time: ____________ Date: ____________ Movements: ____________ Start time: ____________ Finish time: ____________ Document Released: 11/17/2006 Document Revised: 03/04/2014 Document Reviewed: 08/14/2012 Exit[employer] Patient Information 2015 [employer]. This information is not intended to replace advice given to you by your health care provider. Make sure you discuss any questions you have with your health care provider.  

## 2014-07-24 NOTE — Progress Notes (Signed)
Patient is doing well, she declines flu vaccine today.

## 2014-07-31 ENCOUNTER — Ambulatory Visit (INDEPENDENT_AMBULATORY_CARE_PROVIDER_SITE_OTHER): Payer: Managed Care, Other (non HMO) | Admitting: Advanced Practice Midwife

## 2014-07-31 VITALS — BP 126/69 | HR 100 | Wt 173.0 lb

## 2014-07-31 DIAGNOSIS — Z3403 Encounter for supervision of normal first pregnancy, third trimester: Secondary | ICD-10-CM

## 2014-07-31 DIAGNOSIS — Z3400 Encounter for supervision of normal first pregnancy, unspecified trimester: Secondary | ICD-10-CM

## 2014-07-31 NOTE — Patient Instructions (Signed)

## 2014-07-31 NOTE — Progress Notes (Signed)
EFW around 7-7.5lbs.  Had waterbirth lined up and has done hospital tour. Has liner. Excited to have the baby.

## 2014-08-07 ENCOUNTER — Encounter (HOSPITAL_COMMUNITY): Payer: Self-pay | Admitting: *Deleted

## 2014-08-07 ENCOUNTER — Inpatient Hospital Stay (HOSPITAL_COMMUNITY)
Admission: AD | Admit: 2014-08-07 | Discharge: 2014-08-09 | DRG: 769 | Disposition: A | Discharge status: Home / Self Care | Payer: Managed Care, Other (non HMO) | Source: Ambulatory Visit | Attending: Obstetrics & Gynecology | Admitting: Obstetrics & Gynecology

## 2014-08-07 ENCOUNTER — Encounter: Payer: Managed Care, Other (non HMO) | Admitting: Physician Assistant

## 2014-08-07 DIAGNOSIS — Z8249 Family history of ischemic heart disease and other diseases of the circulatory system: Secondary | ICD-10-CM

## 2014-08-07 DIAGNOSIS — Z833 Family history of diabetes mellitus: Secondary | ICD-10-CM

## 2014-08-07 DIAGNOSIS — IMO0001 Reserved for inherently not codable concepts without codable children: Secondary | ICD-10-CM

## 2014-08-07 DIAGNOSIS — Z3A39 39 weeks gestation of pregnancy: Secondary | ICD-10-CM | POA: Diagnosis present

## 2014-08-07 DIAGNOSIS — Z3403 Encounter for supervision of normal first pregnancy, third trimester: Secondary | ICD-10-CM | POA: Diagnosis present

## 2014-08-07 LAB — CBC
HEMATOCRIT: 41.3 % (ref 36.0–46.0)
HEMOGLOBIN: 14 g/dL (ref 12.0–15.0)
MCH: 30.6 pg (ref 26.0–34.0)
MCHC: 33.9 g/dL (ref 30.0–36.0)
MCV: 90.2 fL (ref 78.0–100.0)
Platelets: 202 10*3/uL (ref 150–400)
RBC: 4.58 MIL/uL (ref 3.87–5.11)
RDW: 14.4 % (ref 11.5–15.5)
WBC: 14.9 10*3/uL — ABNORMAL HIGH (ref 4.0–10.5)

## 2014-08-07 LAB — RPR

## 2014-08-07 MED ORDER — CITRIC ACID-SODIUM CITRATE 334-500 MG/5ML PO SOLN: 30.0000 mL | ORAL | Status: DC | PRN

## 2014-08-07 MED ORDER — LACTATED RINGERS IV SOLN: INTRAVENOUS | Status: DC

## 2014-08-07 MED ORDER — WITCH HAZEL-GLYCERIN EX PADS: 1.0000 "application " | MEDICATED_PAD | CUTANEOUS | Status: DC | PRN

## 2014-08-07 MED ORDER — LACTATED RINGERS IV SOLN: 500.0000 mL | INTRAVENOUS | Status: DC | PRN

## 2014-08-07 MED ORDER — OXYCODONE-ACETAMINOPHEN 5-325 MG PO TABS: 2.0000 | ORAL_TABLET | ORAL | Status: DC | PRN

## 2014-08-07 MED ORDER — ZOLPIDEM TARTRATE 5 MG PO TABS: 5.0000 mg | ORAL_TABLET | Freq: Every evening | ORAL | Status: DC | PRN

## 2014-08-07 MED ORDER — FLEET ENEMA 7-19 GM/118ML RE ENEM: 1.0000 | ENEMA | Freq: Every day | RECTAL | Status: DC | PRN

## 2014-08-07 MED ORDER — OXYCODONE-ACETAMINOPHEN 5-325 MG PO TABS: 1.0000 | ORAL_TABLET | ORAL | Status: DC | PRN

## 2014-08-07 MED ORDER — OXYTOCIN 40 UNITS IN LACTATED RINGERS INFUSION - SIMPLE MED: 62.5000 mL/h | INTRAVENOUS | Status: DC

## 2014-08-07 MED ORDER — ONDANSETRON HCL 4 MG/2ML IJ SOLN: 4.0000 mg | Freq: Four times a day (QID) | INTRAMUSCULAR | Status: DC | PRN

## 2014-08-07 MED ORDER — SENNOSIDES-DOCUSATE SODIUM 8.6-50 MG PO TABS
2.0000 | ORAL_TABLET | ORAL | Status: DC
  Administered 2014-08-07 – 2014-08-09 (×2): 2 via ORAL
  Filled 2014-08-07 (×2): qty 2

## 2014-08-07 MED ORDER — ONDANSETRON HCL 4 MG PO TABS: 4.0000 mg | ORAL_TABLET | ORAL | Status: DC | PRN

## 2014-08-07 MED ORDER — IBUPROFEN 600 MG PO TABS
600.0000 mg | ORAL_TABLET | Freq: Four times a day (QID) | ORAL | Status: DC
  Administered 2014-08-07 – 2014-08-09 (×7): 600 mg via ORAL
  Filled 2014-08-07 (×8): qty 1

## 2014-08-07 MED ORDER — LANOLIN HYDROUS EX OINT: TOPICAL_OINTMENT | CUTANEOUS | Status: DC | PRN

## 2014-08-07 MED ORDER — FENTANYL CITRATE 0.05 MG/ML IJ SOLN
INTRAMUSCULAR | Status: AC
  Filled 2014-08-07: qty 2

## 2014-08-07 MED ORDER — FENTANYL CITRATE 0.05 MG/ML IJ SOLN
100.0000 ug | Freq: Once | INTRAMUSCULAR | Status: AC
  Administered 2014-08-07: 100 ug via INTRAVENOUS

## 2014-08-07 MED ORDER — OXYTOCIN 10 UNIT/ML IJ SOLN
INTRAMUSCULAR | Status: AC
  Filled 2014-08-07: qty 1

## 2014-08-07 MED ORDER — TETANUS-DIPHTH-ACELL PERTUSSIS 5-2.5-18.5 LF-MCG/0.5 IM SUSP
0.5000 mL | Freq: Once | INTRAMUSCULAR | Status: DC
Start: 2014-08-08 — End: 2014-08-08

## 2014-08-07 MED ORDER — ACETAMINOPHEN 325 MG PO TABS: 650.0000 mg | ORAL_TABLET | ORAL | Status: DC | PRN

## 2014-08-07 MED ORDER — DIPHENHYDRAMINE HCL 25 MG PO CAPS
25.0000 mg | ORAL_CAPSULE | Freq: Four times a day (QID) | ORAL | Status: DC | PRN
  Administered 2014-08-08 – 2014-08-09 (×3): 25 mg via ORAL
  Filled 2014-08-07 (×3): qty 1

## 2014-08-07 MED ORDER — SIMETHICONE 80 MG PO CHEW: 80.0000 mg | CHEWABLE_TABLET | ORAL | Status: DC | PRN

## 2014-08-07 MED ORDER — OXYTOCIN BOLUS FROM INFUSION: 500.0000 mL | INTRAVENOUS | Status: DC

## 2014-08-07 MED ORDER — PRENATAL MULTIVITAMIN CH
1.0000 | ORAL_TABLET | Freq: Every day | ORAL | Status: DC
  Administered 2014-08-08 – 2014-08-09 (×2): 1 via ORAL
  Filled 2014-08-07 (×2): qty 1

## 2014-08-07 MED ORDER — DIBUCAINE 1 % RE OINT
1.0000 | TOPICAL_OINTMENT | RECTAL | Status: DC | PRN
Start: 2014-08-07 — End: 2014-08-09

## 2014-08-07 MED ORDER — BENZOCAINE-MENTHOL 20-0.5 % EX AERO
1.0000 "application " | INHALATION_SPRAY | CUTANEOUS | Status: DC | PRN
  Administered 2014-08-08: 1 via TOPICAL
  Filled 2014-08-07: qty 56

## 2014-08-07 MED ORDER — OXYTOCIN BOLUS FROM INFUSION
500.0000 mL | INTRAVENOUS | Status: DC
Start: 2014-08-07 — End: 2014-08-07

## 2014-08-07 MED ORDER — LIDOCAINE HCL (PF) 1 % IJ SOLN: 30.0000 mL | INTRAMUSCULAR | Status: DC | PRN

## 2014-08-07 MED ORDER — OXYCODONE-ACETAMINOPHEN 5-325 MG PO TABS
1.0000 | ORAL_TABLET | ORAL | Status: DC | PRN
  Administered 2014-08-07: 1 via ORAL
  Filled 2014-08-07: qty 1

## 2014-08-07 MED ORDER — LIDOCAINE HCL (PF) 1 % IJ SOLN
30.0000 mL | INTRAMUSCULAR | Status: DC | PRN
  Administered 2014-08-07: 30 mL via SUBCUTANEOUS
  Filled 2014-08-07 (×2): qty 30

## 2014-08-07 MED ORDER — OXYCODONE-ACETAMINOPHEN 5-325 MG PO TABS
1.0000 | ORAL_TABLET | ORAL | Status: DC | PRN
  Administered 2014-08-09: 1 via ORAL
  Filled 2014-08-07: qty 1

## 2014-08-07 MED ORDER — ONDANSETRON HCL 4 MG/2ML IJ SOLN: 4.0000 mg | INTRAMUSCULAR | Status: DC | PRN

## 2014-08-07 MED ORDER — OXYTOCIN 40 UNITS IN LACTATED RINGERS INFUSION - SIMPLE MED
62.5000 mL/h | INTRAVENOUS | Status: DC
  Administered 2014-08-07: 62.5 mL/h via INTRAVENOUS
  Filled 2014-08-07: qty 1000

## 2014-08-07 NOTE — MAU Note (Signed)
In room in BS. Birdena Crandallainey RN at bedside. Monitors applied

## 2014-08-07 NOTE — Progress Notes (Signed)
Patient ID: Alyssa HamiltonSarah B Gutierrez, female   DOB: June 17, 1986, 28 y.o.   MRN: [id number] Doing well.  Now in tub,  Breathing through contractions  Feeling pressure Dilation: 8 Effacement (%): 100 Cervical Position: Anterior Station: 0 Presentation: Vertex Exam by:: M. Yordi Krager cnm + show  FHR 140-150s.  UCs every 2-4 minutes  Will continue to observe

## 2014-08-07 NOTE — H&P (Signed)
Attestation of Attending Supervision of Resident: Evaluation and management procedures were performed by the [facility] under my supervision.  I have seen and examined the patient, reviewed the resident's note and chart, and I agree with the management and plan.  Anibal Hendersonarolyn L Harraway-Smith, M.D. 08/07/2014 1:43 PM

## 2014-08-07 NOTE — MAU Note (Signed)
Dr. Thornell SartoriusWohlert at bedside. Rechecked pt. Cervical exam: 7cm, 90%, -1 station

## 2014-08-07 NOTE — H&P (Signed)
Attestation of Attending Supervision of Advanced Practitioner (CNM/NP): Evaluation and management procedures were performed by the Advanced Practitioner under my supervision and collaboration.  I have reviewed the Advanced Practitioner's note and chart, and I agree with the management and plan.  HARRAWAY-SMITH, Sativa Gelles 1:41 PM     

## 2014-08-07 NOTE — H&P (Signed)
LABOR ADMISSION HISTORY AND PHYSICAL  Alyssa Gutierrez is a 28 y.o. female G1P0000 with IUP at 62w4dby L/19 presenting with painful contractions every few minutes beginning 10:00 AM this morning. Some spotting. No gush of fluid. Feeling baby move. Denies blurry vision, headaches or peripheral edema, and RUQ pain. She desires water birth for pain control. She plans on breast feeding. She requests natural family planning vs OCPs for birth control.   Dating: By LMP and 19-week sonogram. Estimated Date of Delivery: 08/10/14.   Prenatal History/Complications: - Echogenic bowel seen on initial grown scan followed with q4 week scans. Low risk fetal DNA, negative CF screen, negative infectious workup. - Accelerated growth also seen on initial growth scans. Most recent scan (33 wks) showing EFW 82% and AC > 97%  Past Medical History: Past Medical History  Diagnosis Date  . History of chicken pox     age 28 . History of headache   . History of allergy   . Family history of heart murmur     at birth  . History of migraine   . IBS (irritable bowel syndrome)     Past Surgical History: Past Surgical History  Procedure Laterality Date  . Wisdom tooth extraction      age 89 . Root canal      age 28   Obstetrical History: OB History   Grav Para Term Preterm Abortions TAB SAB Ect Mult Living   '1 0 0 0 0 0 0 0 0 0 '       Gynecological History: Pertinent Gynecological History: No history of STDS or gynecologic procedures.   Social History: History   Social History  . Marital Status: Married    Spouse Name: N/A    Number of Children: 0  . Years of Education: N/A   Occupational History  . facilities coordinator    Social History Main Topics  . Smoking status: Never Smoker   . Smokeless tobacco: Never Used  . Alcohol Use: No  . Drug Use: No  . Sexual Activity: Yes   Other Topics Concern  . Not on file   Social History Narrative  . No narrative on file    Family  History: Family History  Problem Relation Age of Onset  . Diabetes Paternal Grandmother   . Hypertension Father   . Heart murmur Brother   . Colon cancer Neg Hx   . Hypertension Paternal Uncle   . Crohn's disease Maternal Grandmother     Allergies: No Known Allergies  Prescriptions prior to admission  Medication Sig Dispense Refill  . Prenatal Vit-Fe Fumarate-FA (PRENATAL MULTIVITAMIN) TABS tablet Take 1 tablet by mouth daily at 12 noon.         Review of Systems   All systems reviewed and negative except as stated in HPI  Last menstrual period 11/03/2013. General appearance: alert, painful contractions Lungs: clear to auscultation bilaterally Heart: regular rate and rhythm Abdomen: gravid, vertex by leopold's Pelvic: 70/90/-2 bulging bag Extremities: o sign of DVT DTR's 2+ patellar Presentation: cephalic Fetal monitoringBaseline: 155 bpm, moderate variability, + accelerations, - decelerations.  Uterine activity strong q3 min SVE: 7/90/-2 bulging bag.   Prenatal labs: ABO, Rh: O/POS/-- (03/03 0915) Antibody: NEG (03/03 0915) Rubella:   RPR: NON REAC (07/22 0932)  HBsAg: NEGATIVE (03/03 0915)  HIV: NONREACTIVE (07/22 0932)  GBS:   negative 1 hr Glucola 97 Genetic screening  Negative fetal dna and CF screen   Prenatal Transfer Tool  Maternal Diabetes: No Genetic Screening: see above Maternal Ultrasounds/Referrals: wee above Fetal Ultrasounds or other Referrals:  None Maternal Substance Abuse:  No Significant Maternal Medications:  None Significant Maternal Lab Results: None  Planning Waterbirth. Did class on 06/19/14. Needs to sign consent Clinic [place]  Dating LMP/19 week [place].   Genetic Screen  NIPS: Panorama normal, Female  Anatomic [place] Female. Normal except echogenic bowel. 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  Patient Active Problem List   Diagnosis Date Noted  . Echogenic bowel of fetus on prenatal ultrasound 04/23/2014  . Supervision of normal first pregnancy in first trimester 01/31/2014  . Screening for lipoid disorders 06/15/2013  . Diarrhea 03/19/2013  . Abdominal  pain, other specified site 03/19/2013  . Migraine headache 03/19/2013    Assessment: Alyssa Gutierrez is a 28 y.o. G1P0000 at 39w4dhere in active spontaneous labor. Category 1 tracing2  #Labor: Admit, expectant mgmt #Pain: Water birth, has kit #FWB: Category 1 strip, continue EFM #ID:  GBS negative #MOF: breast #MOC: natural family planning vs. OCPs #Circ:  no  WRavine NSpring Valley10/05/2014, 12:22 PM

## 2014-08-07 NOTE — Progress Notes (Signed)
Patient ID: Alyssa HamiltonSarah B Gutierrez, female   DOB: 1986-08-07, 28 y.o.   MRN: [id number] Now feeling UCs more frequently  Amniotomy performed per pt request Clear fluid  FHR reactive per EFM  Cervix Anterior Lip/100/0  Will anticipate birth soon.

## 2014-08-07 NOTE — MAU Note (Signed)
Pt presents complaining of contractions every 2-3 minutes. Complaining of some bloody show. Denies leaking of fluid. Reports good fetal movement.

## 2014-08-07 NOTE — H&P (Signed)
Alyssa Gutierrez is a 28 y.o. female presenting for active labor.  Pt reports contractions beginning at 1000 today.  Denies leaking of fluid.  +blood tinged mucus.  Pregnancy dated by 20 wk ultrasound.  [facility] at 12 wks of pregnancy.  No complications noted during pregnancy.  +fetal echogenic bowel on early ultrasound, not reported on 33 wk ultrasound.  Fetal growth at 82nd%ile.    History OB History   Grav Para Term Preterm Abortions TAB SAB Ect Mult Living   1 0 0 0 0 0 0 0 0 0      Past Medical History  Diagnosis Date  . History of chicken pox     age 28  . History of headache   . History of allergy   . Family history of heart murmur     at birth  . History of migraine   . IBS (irritable bowel syndrome)    Past Surgical History  Procedure Laterality Date  . Wisdom tooth extraction      age 89  . Root canal      age 89   Family History: family history includes Crohn's disease in her maternal grandmother; Diabetes in her paternal grandmother; Heart murmur in her brother; Hypertension in her father and paternal uncle. There is no history of Colon cancer. Social History:  reports that she has never smoked. She has never used smokeless tobacco. She reports that she does not drink alcohol or use illicit drugs.   Review of Systems  Gastrointestinal: Positive for abdominal pain (contractions).  Genitourinary:       Blood tinged mucus    Dilation: 7 Effacement (%): 90 Station: -1 Exam by:: Dr. Thornell SartoriusWohlert Blood pressure 114/69, pulse 89, temperature 98 F (36.7 C), temperature source Oral, resp. rate 18, last menstrual period 11/03/2013. Maternal Exam:  Introitus: Vagina is positive for vaginal discharge (mucusy).    Physical Exam  Constitutional: She is oriented to person, place, and time. She appears well-developed and well-nourished.  HENT:  Head: Normocephalic.  Neck: Normal range of motion. Neck supple.  Cardiovascular:  Normal rate, regular rhythm and normal heart sounds.   Respiratory: Effort normal and breath sounds normal.  GI: Soft.  EFW 7.5-8lbs  Genitourinary: No bleeding around the vagina. Vaginal discharge (mucusy) found.  Musculoskeletal: Normal range of motion. She exhibits no edema.  Neurological: She is alert and oriented to person, place, and time.  Skin: Skin is warm and dry.    Prenatal labs: ABO, Rh: O/POS/-- (03/03 0915) Antibody: NEG (03/03 0915) Rubella: 0.63 (03/03 0915) RPR: NON REAC (07/22 0932)  HBsAg: NEGATIVE (03/03 0915)  HIV: NONREACTIVE (07/22 0932)  GBS:     Assessment/Plan: 28 yo G1P0000 at 3516w4d wks IUP Active Labor Category I FHR Tracing GBS negative  [facility]: [facility] May labor/birth in water Intermittent monitoring Anticipate NSVD   Marlis EdelsonKARIM, WALIDAH N 08/07/2014, 1:02 PM

## 2014-08-08 LAB — HIV ANTIBODY (ROUTINE TESTING W REFLEX): HIV: NONREACTIVE

## 2014-08-08 MED ORDER — INFLUENZA VAC SPLIT QUAD 0.5 ML IM SUSY: 0.5000 mL | PREFILLED_SYRINGE | INTRAMUSCULAR | Status: DC

## 2014-08-08 NOTE — Progress Notes (Signed)
Post Partum Day 1 Subjective:  Alyssa Gutierrez is a 28 y.o. G1P1001 1343w4d s/p NSVD (waterbirth).  No acute events overnight.  Pt denies problems with ambulating, voiding or po intake.  She denies nausea or vomiting.  Pain is well controlled.  She has had flatus. She has not had bowel movement.  Lochia Minimal.  Plan for birth control is oral contraceptives (estrogen/progesterone).  Method of Feeding: Breast  Objective: Blood pressure 122/77, pulse 111, temperature 98.2 F (36.8 C), temperature source Oral, resp. rate 18, height 5\' 2"  (1.575 m), weight 78.926 kg (174 lb), last menstrual period 11/03/2013, SpO2 98.00%, unknown if currently breastfeeding.  Physical Exam:  General: alert, cooperative and no distress Lochia:normal flow Chest: CTAB Heart: RRR no m/r/g Abdomen: +BS, soft, nontender,  Uterine Fundus: firm DVT Evaluation: No evidence of DVT seen on physical exam. Extremities: no edema   Recent Labs  08/07/14 1255  HGB 14.0  HCT 41.3    Assessment/Plan:  ASSESSMENT: Alyssa HamiltonSarah B Mcclellan is a 28 y.o. G1P1001 2943w4d s/p NSVD, waterbirth  Plan for discharge tomorrow   LOS: 1 day   Jacquiline Doearker, Caleb 08/08/2014, 11:57 AM

## 2014-08-08 NOTE — Lactation Note (Signed)
This note was copied from the chart of Alyssa Gutierrez. Lactation Consultation Note New mom w/tiny small short shaft nipples bilateraly. Stimulation pulls out some, not enough for a deep latch at this time. Breast feels heavy. Hand expression taught, none noted. Shells given to wear today w/bra. Fitted and demonstrated NS #16, #20. Hand pump given and demonstrated w/no colostrum noted. Baby face bruised pretty bad. Very spity. No interest in eating at this time. Mom encouraged to feed baby 8-12 times/24 hours and with feeding cues. Mom encouraged to waken baby for feeds. Mom reports + breast changes w/pregnancy. Mom encouraged to do skin-to-skin.Referred to Baby and Me Book in Breastfeeding section Pg. 22-23 for position options and Proper latch demonstration.Encouraged comfort during BF so colostrum flows better and mom will enjoy the feeding longer. Taking deep breaths and breast massage during BF. Encouraged to call for assistance if needed and to verify proper latch.[facility]/LC brochure given w/resources, support groups and LC services. Newborn behavior explained. Discussion regarding jaundice and bruising and how important feeding is to help baby poop out bilirubin.  Patient Name: Alyssa Roxy CedarSarah Gutierrez ZOXWR'UToday's Date: 08/08/2014 Reason for consult: Initial assessment   Maternal Data Has patient been taught Hand Expression?: Yes Does the patient have breastfeeding experience prior to this delivery?: No  Feeding    LATCH Score/Interventions Latch: Too sleepy or reluctant, no latch achieved, no sucking elicited. Intervention(s): Skin to skin;Teach feeding cues;Waking techniques Intervention(s): Adjust position;Assist with latch;Breast massage;Breast compression  Audible Swallowing: None Intervention(s): Skin to skin;Hand expression  Type of Nipple: Everted at rest and after stimulation (tiny little short nipples)  Comfort (Breast/Nipple): Soft / non-tender     Hold (Positioning): Assistance  needed to correctly position infant at breast and maintain latch. Intervention(s): Breastfeeding basics reviewed;Support Pillows;Position options;Skin to skin  LATCH Score: 5  Lactation Tools Discussed/Used Tools: Shells;Nipple Dorris CarnesShields;Pump Nipple shield size: 16;20 Shell Type: Inverted Breast pump type: Manual Pump Review: Milk Storage;Setup, frequency, and cleaning Initiated by:: Peri JeffersonL. Mayerly Kaman RN Date initiated:: 08/08/14   Consult Status Consult Status: Follow-up Date: 08/08/14 Follow-up type: In-patient    Alyssa Gutierrez, Diamond NickelLAURA Gutierrez 08/08/2014, 4:59 AM

## 2014-08-08 NOTE — Discharge Summary (Signed)
Obstetric Discharge Summary Reason for Admission: onset of labor Prenatal Procedures: none Intrapartum Procedures: spontaneous vaginal delivery, waterbirth Postpartum Procedures: none Complications-Operative and Postpartum: 2nd degree perineal laceration, repaired with 3.0 vicryl  Hospital Course:  Patient presented in labor, and progressed well on pitocin with AROM.   Delivery Note Waterbirth. Pushed well with good heart tones throughout. At 5:29 PM a viable and healthy female was delivered via Vaginal, Spontaneous Delivery (Presentation: ; Occiput Anterior).   Unable to delivery anterior shoulder. Loop of cord noted at neck, coming through vagina. Loose with +pulse. I was able to grasp posterior axilla and then the hand (right arm) and sweep the posterior arm out. Then rest of baby delivered easily. Vigorous cry immediately. Total time less than 40 seconds.   APGAR: 8, 9; weight .  Placenta status: Intact, Spontaneous. Cord: 3 vessels with the following complications: None.  Anesthesia: Local  Pudendal  Episiotomy: None  Lacerations: 2nd degree;Perineal Dr Emelda FearFerguson consulted to evaluate laceration. No entry into capsule or posterior wall. Second degree repaired in usual fashion under Pudendal anesthesia.  Suture Repair: 3.0 vicryl rapide  Est. Blood Loss (mL): 300  Mom to postpartum. Baby to Couplet care / Skin to Skin.   [facility] UniversityWILLIAMS,MARIE  08/07/2014, 6:44 PM   The patient's postpartum course was uncomplicated and at the time of discharge,  Pt denied problems with ambulating, voiding or po intake.  She denied nausea or vomiting.  Pain was well controlled.  She had flatus. Minimal lochia.   H/H: Lab Results  Component Value Date/Time   HGB 14.0 08/07/2014 12:55 PM   HCT 41.3 08/07/2014 12:55 PM    Filed Vitals:   08/08/14 0050  BP: 122/77  Pulse: 111  Temp: 98.2 F (36.8 C)  Resp: 18    Physical Exam: VSS NAD Abd: Appropriately tender, ND, Fundus firm No c/c/e, Neg  homan's sign, neg cords Lochia Appropriate  Discharge Diagnoses: Term Pregnancy-delivered  Discharge Information: Date: 05/13/2011 Activity: pelvic rest Diet: routine  Medications: PNV Breast feeding:  Yes Condition: stable Instructions: refer to handout Discharge to: home      Medication List         prenatal multivitamin Tabs tablet  Take 1 tablet by mouth daily at 12 noon.           Follow-up Information   [facility]. Schedule an appointment as soon as possible for a visit in 6 weeks. (for follow up)    Specialty:  Obstetrics and Gynecology   Contact information:   [address] [place] [address] [phone]      Jacquiline Doearker, Caleb 08/08/2014,9:26 AM  I have seen and examined this patient and agree the above assessment. CRESENZO-DISHMAN,Ramelo Oetken 08/15/2014 9:01 AM

## 2014-08-08 NOTE — Discharge Instructions (Signed)

## 2014-08-09 ENCOUNTER — Encounter (HOSPITAL_COMMUNITY): Payer: Self-pay | Admitting: *Deleted

## 2014-08-09 MED ORDER — NORETHINDRONE 0.35 MG PO TABS: 1.0000 | ORAL_TABLET | Freq: Every day | ORAL | Status: DC

## 2014-08-09 NOTE — Discharge Summary (Signed)
Obstetric Discharge Summary Reason for Admission: onset of labor Prenatal Procedures: none Intrapartum Procedures: spontaneous vaginal delivery, waterbirth Postpartum Procedures: none Complications-Operative and Postpartum: 2nd degree perineal laceration, repaired with 3.0 vicryl  Hospital Course:  Patient presented in labor, and progressed well on pitocin with AROM.   Delivery Note Waterbirth. Pushed well with good heart tones throughout. At 5:29 PM a viable and healthy female was delivered via Vaginal, Spontaneous Delivery (Presentation: ; Occiput Anterior).   Unable to delivery anterior shoulder. Loop of cord noted at neck, coming through vagina. Loose with +pulse. I was able to grasp posterior axilla and then the hand (right arm) and sweep the posterior arm out. Then rest of baby delivered easily. Vigorous cry immediately. Total time less than 40 seconds.   APGAR: 8, 9; weight .  Placenta status: Intact, Spontaneous. Cord: 3 vessels with the following complications: None.  Anesthesia: Local  Pudendal  Episiotomy: None  Lacerations: 2nd degree;Perineal Dr Emelda FearFerguson consulted to evaluate laceration. No entry into capsule or posterior wall. Second degree repaired in usual fashion under Pudendal anesthesia.  Suture Repair: 3.0 vicryl rapide  Est. Blood Loss (mL): 300  Mom to postpartum. Baby to Couplet care / Skin to Skin.   [facility],MARIE  08/07/2014, 6:44 PM  Subjective:  The patient's postpartum course was uncomplicated and at the time of discharge,  Pt denied problems with ambulating, voiding or po intake.  She denied nausea or vomiting.  Pain was well controlled.  She had flatus. Minimal lochia.   H/H: Lab Results  Component Value Date/Time   HGB 14.0 08/07/2014 12:55 PM   HCT 41.3 08/07/2014 12:55 PM    Filed Vitals:   08/09/14 0556  BP: 119/79  Pulse: 85  Temp: 98.1 F (36.7 C)  Resp: 18    Physical Exam: VSS NAD Abd: Appropriately tender, ND, Fundus firm No  c/c/e, Neg homan's sign, neg cords Lochia Appropriate  Discharge Diagnoses: Term Pregnancy-delivered  Discharge Information: Date: 05/13/2011 Activity: pelvic rest Diet: routine  Medications: PNV Breast feeding:  Yes Condition: stable Instructions: refer to handout Discharge to: home      Medication List         norethindrone 0.35 MG tablet  Commonly known as:  ORTHO MICRONOR  Take 1 tablet (0.35 mg total) by mouth daily.     prenatal multivitamin Tabs tablet  Take 1 tablet by mouth daily at 12 noon.       Follow-up Information   [facility]. Schedule an appointment as soon as possible for a visit in 6 weeks. (for follow up)    Specialty:  Obstetrics and Gynecology   Contact information:   [address] [place] [address] [phone]      Jacquiline Doearker, Caleb 08/09/2014,8:09 AM   I have seen and examined this patient and agree the above assessment. CRESENZO-DISHMAN,Yoona Ishii 08/12/2014 7:53 PM

## 2014-08-09 NOTE — Lactation Note (Addendum)
This note was copied from the chart of Alyssa Roxy CedarSarah Gutierrez. Lactation Consultation Note RN reported during pumping that mom c/o skin coming off w/flange when finished pumping. Assessed Lt. Nipple and noted thin layer of skin peeled off. Mom stated she was pumping bent forward to allow colostrum to run into bottle then removed flange and it hurt and noted skin came off with flange. I'm not sure how or why the skin came off. Encouraged mom to rub colostrum to area and report to Dr. Laverle PatterMom uses nipple shield and stated not receiving irritation from that. Encouraged to hand express the Lt. Breast until Dr. Earnest BaileyAssess. Not to apply flange to breast until MD assess. RN gave comfort gels. Patient Name: Alyssa Roxy CedarSarah Gutierrez GNFAO'ZToday's Date: 08/09/2014 Reason for consult: Follow-up assessment;Breast/nipple pain   Maternal Data    Feeding    LATCH Score/Interventions          Comfort (Breast/Nipple): Engorged, cracked, bleeding, large blisters, severe discomfort Problem noted: Cracked, bleeding, blisters, bruises Intervention(s): Double electric pump;Expressed breast milk to nipple           Lactation Tools Discussed/Used Tools: Comfort gels;Pump;Nipple Shields Nipple shield size: 20 Shell Type: Inverted Breast pump type: Double-Electric Breast Pump   Consult Status Consult Status: Follow-up Date: 08/09/14 Follow-up type: In-patient    Alyssa DancerCARVER, Alyssa Gutierrez 08/09/2014, 5:43 AM

## 2014-08-09 NOTE — Lactation Note (Addendum)
This note was copied from the chart of Alyssa Roxy CedarSarah Hagan. Lactation Consultation Note     Follow up consult with this mom of a term baby, now 8039 hours old. The baby was at 5% weight loss at 29 hours of life. Mom has excoritaed are where skin sloughed off, on her left areola, proabably from flanges that were too large. Mom is placing EBm and using comfort gels, and I decreased her to 21 flanges with a much better fit. Mom's nipples are also pink, tender, but intact.  Mom is using a 20 nipple shield, and I reviewed application with her, and she demonstrated with great technique. i also noted that mom speaks with a slight lisp, and on exam of baby's oral anatomy, I noted an uppper lip frenulum that extends to the gum line, and a high palate, and poserior short frenulum, that seems to be limiting some forward tongue movement. With tongue sucksing, the baby can get his tongue over the bottom gum line, but he does not pull my finger into his mouth. Mom and dad aware of my findings, and will point this out to their pediatrician. I explained that these findings may  or may not impact breast feeding. Since mom is sore, using a shield, and baby has some limited tongue movement, I suggested they stay at least until this afternoon, to recheck the baby's weight, and possibly have them stay an additional night, to make sure things are going well . Dr. Ezequiel EssexGable aware and agrees with above plan of care.  Patient Name: Alyssa Roxy CedarSarah Gutierrez [id number]'s Date: 08/09/2014 Reason for consult: Follow-up assessment   Maternal Data    Feeding Feeding Type: Breast Milk Length of feed:  (in progress)  LATCH Score/Interventions Latch: Repeated attempts needed to sustain latch, nipple held in mouth throughout feeding, stimulation needed to elicit sucking reflex. Intervention(s): Skin to skin;Waking techniques Intervention(s): Adjust position;Assist with latch  Audible Swallowing: A few with stimulation  Type of Nipple: Everted at  rest and after stimulation  Comfort (Breast/Nipple): Filling, red/small blisters or bruises, mild/mod discomfort Problem noted: Cracked, bleeding, blisters, bruises Intervention(s): Expressed breast milk to nipple  Problem noted: Severe discomfort Interventions (Severe discomfort): Flange size (decreased to 21 )  Hold (Positioning): Assistance needed to correctly position infant at breast and maintain latch. Intervention(s): Breastfeeding basics reviewed;Support Pillows;Position options;Skin to skin  LATCH Score: 6  Lactation Tools Discussed/Used Tools: Nipple Dorris CarnesShields;Flanges Nipple shield size: 20 Flange Size:  (decreased mom to 21 flanges with much better fit, more comfortable as per mom) Shell Type: Inverted Breast pump type: Double-Electric Breast Pump   Consult Status Consult Status: Follow-up Date: 08/09/14 Follow-up type: In-patient    Alfred LevinsLee, Alyssa Gutierrez 08/09/2014, 9:31 AM

## 2014-08-09 NOTE — Lactation Note (Signed)
This note was copied from the chart of Alyssa Roxy CedarSarah Wilk. Lactation Consultation Note    Follow up consult with this mom and baby, now 3847 hours old. Parents feekl comfortable with going home, so Dr.Gable and i spoke to them, and came up with a plan for the next 2 days, until they see their pediatrician, l 1 - feed baby at breast with nipple shield every 3 hours limit to about 15 minutes  2- Pump after for 15 minutes, followed with hand expression 3- offer supplement after breast feeding, EBM adn Alimentum, total up to 20 mls for nect 24 hours, and then up to 30 mls, at age 27 hours on.  Mom will call for o/p consult, after seeing pediatrician, with or without frenotomy being done.     Patient Name: Alyssa Roxy CedarSarah Buchbinder ZOXWR'UToday's Date: 08/09/2014 Reason for consult: Follow-up assessment   Maternal Data    Feeding    LATCH Score/Interventions                      Lactation Tools Discussed/Used     Consult Status Consult Status: Complete Follow-up type: Call as needed    Alfred LevinsLee, Tekeshia Klahr Anne 08/09/2014, 5:01 PM

## 2014-08-10 NOTE — Progress Notes (Signed)
I have seen and examined this patient and I agree with the above. Cam HaiSHAW, KIMBERLY CNM 8:42 AM 08/10/2014

## 2014-08-12 NOTE — Progress Notes (Signed)
Post discharge chart review completed.  

## 2014-08-13 NOTE — Discharge Summary (Signed)
Attestation of Attending Supervision of Advanced Practitioner (CNM/NP): Evaluation and management procedures were performed by the Advanced Practitioner under my supervision and collaboration.  I have reviewed the Advanced Practitioner's note and chart, and I agree with the management and plan.  HARRAWAY-SMITH, Yanelis Osika 10:04 AM

## 2014-08-18 NOTE — Discharge Summary (Signed)
Attestation of Attending Supervision of Advanced Practitioner (CNM/NP): Evaluation and management procedures were performed by the Advanced Practitioner under my supervision and collaboration.  I have reviewed the Advanced Practitioner's note and chart, and I agree with the management and plan.  HARRAWAY-SMITH, Peytan Andringa 9:03 AM

## 2014-09-02 ENCOUNTER — Encounter (HOSPITAL_COMMUNITY): Payer: Self-pay | Admitting: *Deleted

## 2014-09-18 ENCOUNTER — Ambulatory Visit: Payer: Managed Care, Other (non HMO) | Admitting: Obstetrics & Gynecology

## 2014-09-24 ENCOUNTER — Encounter: Payer: Self-pay | Admitting: Family Medicine

## 2014-09-24 ENCOUNTER — Ambulatory Visit (INDEPENDENT_AMBULATORY_CARE_PROVIDER_SITE_OTHER): Payer: Managed Care, Other (non HMO) | Admitting: Family Medicine

## 2014-09-24 DIAGNOSIS — K589 Irritable bowel syndrome without diarrhea: Secondary | ICD-10-CM

## 2014-09-24 NOTE — Progress Notes (Signed)
  Subjective:     Glennon HamiltonSarah B Pinzon is a 28 y.o. female who presents for a postpartum visit. She is 6 weeks postpartum following a spontaneous vaginal delivery. I have fully reviewed the prenatal and intrapartum course. The delivery was at 39 gestational weeks. Outcome: spontaneous vaginal delivery. Anesthesia: none. Postpartum course has been unremarkable. Baby's course has been normal. Baby is feeding by breast. Bleeding no bleeding. Bowel function is normal. Bladder function is normal. Patient is not sexually active. Contraception method is oral progesterone-only contraceptive. Postpartum depression screening: negative.  The following portions of the patient's history were reviewed and updated as appropriate: allergies, current medications, past family history, past medical history, past social history, past surgical history and problem list.  Review of Systems A comprehensive review of systems was negative.   Objective:    BP 129/83 mmHg  Pulse 79  Wt 152 lb (68.947 kg)  Breastfeeding? Yes  General:  alert, cooperative and appears stated age   Vulva:  normal  Vagina: continued healing of laceration with some mild erythema, early thinning associated with breast feeding.  Cervix:  no cervical motion tenderness and no lesions  Corpus: normal size, contour, position, consistency, mobility, non-tender  Adnexa:  normal adnexa        Assessment:     Normal postpartum exam. Pap smear not done at today's visit.   Plan:    1. Contraception: condoms 2. Follow up in: 1 year or as needed if desires further contraception.

## 2014-09-24 NOTE — Patient Instructions (Signed)
Natural Family Planning Natural Family Planning (NFP) is a type of birth control without using any form of contraception. Women who use NFP should not have sexual intercourse when the ovary produces an egg (ovulation) during the menstrual cycle. The NFP method is safe and can prevent pregnancy. It is 75% effective when practiced right. The man needs to also understand this method of birth control and the woman needs to be aware of how her body functions during her menstrual cycle. NFP can also be used as a method of getting pregnant.  HOW THE NFP METHOD WORKS  A woman's menstrual period usually happens every 28-30 days (it can vary from 23-35 days).  Ovulation happens 12-14 days before the start of the next menstrual period (the fertile period). The egg is fertile for 24 hours and the sperm can live for 3 days or more. If there is sexual intercourse at this time, pregnancy can occur. THERE ARE MANY TYPES OF NFP METHODS USED TO PREVENT PREGNANCY  The basal body temperature method. Often times, there is a slight increase of body temperature when a woman ovulates. Take your temperature every morning before getting out of bed. Write the temperature on a chart. An increase in the temperature shows ovulation has happened. Do not have sexual intercourse from the menstrual period up to three days after the increase in the temperature. Note that the body temperature may increase as a result of fever, restless sleep, and working schedules.  The ovulation cervical mucus method. During the menstrual cycle, the cervical mucus changes from dry and sticky to wet and slippery. Check the mucus of the vagina every day to look for these changes. Just before ovulation, the mucus becomes wet and slippery. On the last day of wetness, ovulation happens. To avoid getting pregnant, sexual intercourse is safe for about 10 days after the menstrual period and on the dry mucus days. Do not have sexual intercourse when the mucus  starts to show up and not until 4 days after the wet and slippery mucus goes away. Sexual intercourse after the 4 days have passed until the menstrual period starts is a safe time. Note that the mucus from the vagina can increase because of a vaginal or cervical infection, lubricants, some medicines, and sexual excitement.  The symptothermal method. This method uses both the temperature and the ovulation methods. Combine the two methods above to prevent pregnancy.  The calendar method. Record your menstrual periods and length of the cycles for 6 months. This is helpful when the menstrual cycle varies in the length of the cycle. The length of a menstrual cycle is from day 1 of the present menstrual period to day 1 of the next menstrual period. Then, find your fertile days of the month and do not have sexual intercourse during that time. You may need help from your health care provider to find out your fertile days. There are some signs of ovulation that may be helpful when trying to find the time of ovulation. This includes vaginal spotting or abdominal cramps during the middle of your menstrual cycle. Not all women have these symptoms. YOU SHOULD NOT USE NFP IF:  You have very irregular menstrual periods and may skip months.  You have abnormal bleeding.  You have a vaginal or cervical infection.  You are on medicines that can affect the vaginal mucus or body temperature. These medicines include antibiotics, thyroid medicines, and antihistamines (cold and allergy medicine). Document Released: 04/05/2008 Document Revised: 10/23/2013 Document Reviewed: 04/20/2013   Exit[employer] Patient Information 2015 [employer]. This information is not intended to replace advice given to you by your health care provider. Make sure you discuss any questions you have with your health care provider.  

## 2015-03-14 ENCOUNTER — Ambulatory Visit (INDEPENDENT_AMBULATORY_CARE_PROVIDER_SITE_OTHER)
Admission: RE | Admit: 2015-03-14 | Discharge: 2015-03-14 | Disposition: A | Discharge status: Home / Self Care | Payer: Self-pay | Source: Ambulatory Visit | Attending: Family Medicine | Admitting: Family Medicine

## 2015-03-14 ENCOUNTER — Encounter: Payer: Self-pay | Admitting: Family Medicine

## 2015-03-14 ENCOUNTER — Ambulatory Visit (INDEPENDENT_AMBULATORY_CARE_PROVIDER_SITE_OTHER): Payer: Self-pay | Admitting: Family Medicine

## 2015-03-14 VITALS — BP 116/78 | HR 71 | Temp 98.6°F | Ht 63.0 in | Wt 131.5 lb

## 2015-03-14 DIAGNOSIS — M25531 Pain in right wrist: Secondary | ICD-10-CM | POA: Insufficient documentation

## 2015-03-14 DIAGNOSIS — R110 Nausea: Secondary | ICD-10-CM

## 2015-03-14 DIAGNOSIS — M25571 Pain in right ankle and joints of right foot: Secondary | ICD-10-CM | POA: Insufficient documentation

## 2015-03-14 HISTORY — DX: Nausea: R11.0

## 2015-03-14 NOTE — Assessment & Plan Note (Signed)
Suspect mild flexor tendonitis from holding her baby  Discussed use of a wrist splint (which she has) No s/s of carpal tunnel Ice and relative rest when able -avoiding medication due to breastfeeding currently  Update if no improvement

## 2015-03-14 NOTE — Assessment & Plan Note (Signed)
Medial R ankle pain - no known trauma  Fairly nl exam except mild tenderness post to medial malleolus  Is favoring other foot  Recommend ice and elevation (avoiding meds since breastfeeding) Relative rest when able/supportive footwear  Xray today and update

## 2015-03-14 NOTE — Patient Instructions (Signed)
Use ice on ankle and wrist when you can - 10 minutes at a time  Elevate leg Get a wrist splint (like the kind for carpal tunnel) to use during the day when wrist bothers you  If no improvement in several weeks  For the nausea - check in with your gyn doctor, also do eat a healthy diet  If symptoms worsen let me know

## 2015-03-14 NOTE — Assessment & Plan Note (Signed)
Pt has n/v around day of ovulation every month -since baby was born 7 mo ago No missed menses (using condoms) Is breastfeeding  Wants to avoid medication  Unsure if progesterone only OC or depo provera would help- she will speak to her OBGYN about it

## 2015-03-14 NOTE — Progress Notes (Signed)
Subjective:    Patient ID: Alyssa HamiltonSarah B Bonanno, female    DOB: Apr 25, 1986, 29 y.o.   MRN: [id number]  HPI Here with R ankle and wrist pain  Also c/o of nausea during ovulation   Started last week - when on a cruise  No injury that she knows of  No athletics   A little swollen  Medial pain  Better in the am -worse as the day day goes on  Also stiff after inactivity   Has not used ice  No meds -is breastfeeding   Wrist has also been hurting R - hurt more on the cruise also No swelling   Around ovulation time -she gets very nauseated and vomits several time  It is around ovulation time  No heartburn or stomach pain  Just queasy  Once had diarrhea -not after that  7 months - happens every month   Wants to breastfeed 5 more months  Would rather not go on birth control    Patient Active Problem List   Diagnosis Date Noted  . IBS (irritable bowel syndrome) 09/24/2014  . Migraine headache 03/19/2013   Past Medical History  Diagnosis Date  . History of chicken pox     age 29  . History of headache   . History of allergy   . Family history of heart murmur     at birth  . History of migraine   . IBS (irritable bowel syndrome)    Past Surgical History  Procedure Laterality Date  . Wisdom tooth extraction      age 29  . Root canal      age 29   History  Substance Use Topics  . Smoking status: Never Smoker   . Smokeless tobacco: Never Used  . Alcohol Use: No   Family History  Problem Relation Age of Onset  . Diabetes Paternal Grandmother   . Hypertension Father   . Heart murmur Brother   . Colon cancer Neg Hx   . Hypertension Paternal Uncle   . Crohn's disease Maternal Grandmother    No Known Allergies Current Outpatient Prescriptions on File Prior to Visit  Medication Sig Dispense Refill  . Prenatal Vit-Fe Fumarate-FA (PRENATAL MULTIVITAMIN) TABS tablet Take 1 tablet by mouth daily at 12 noon.     No current facility-administered medications on file  prior to visit.    Review of Systems Review of Systems  Constitutional: Negative for fever, appetite change, fatigue and unexpected weight change.  Eyes: Negative for pain and visual disturbance.  Respiratory: Negative for cough and shortness of breath.   Cardiovascular: Negative for cp or palpitations    Gastrointestinal: Negative for , diarrhea and constipation. neg for heartburn, abd pain , blood in stool or dark stool  Genitourinary: Negative for urgency and frequency.  MSK pos for R wrist and R ankle pain , neg for joint swelling or rash  Skin: Negative for pallor or rash   Neurological: Negative for weakness, light-headedness, numbness and headaches.  Hematological: Negative for adenopathy. Does not bruise/bleed easily.  Psychiatric/Behavioral: Negative for dysphoric mood. The patient is not nervous/anxious.         Objective:   Physical Exam  Constitutional: She appears well-developed and well-nourished. No distress.  HENT:  Head: Normocephalic and atraumatic.  Eyes: Conjunctivae and EOM are normal. Pupils are equal, round, and reactive to light. No scleral icterus.  Neck: Normal range of motion. Neck supple. No thyromegaly present.  Cardiovascular: Normal rate and regular rhythm.  Pulmonary/Chest: Effort normal and breath sounds normal.  Abdominal: Soft. Bowel sounds are normal. She exhibits no distension and no mass. There is no hepatosplenomegaly. There is no tenderness. There is no rebound, no guarding and no CVA tenderness.  Musculoskeletal: She exhibits tenderness. She exhibits no edema.       Right wrist: She exhibits tenderness. She exhibits normal range of motion, no bony tenderness, no swelling, no effusion, no crepitus and no deformity.       Right ankle: She exhibits normal range of motion, no swelling, no ecchymosis, no deformity and normal pulse. Tenderness. Medial malleolus tenderness found. No head of 5th metatarsal and no proximal fibula tenderness found.  Achilles tendon normal.  Tender in soft tissue posterior to medial malleolus of R ankle  Mild discomfort with full plantar flexion  Favors L foot due to pain to bear weight  No eff or crepitus   R wrist : pain to fully flex  Tender generally over flexor tendons medially  No swelling or eff Neg Tinel and Phalen tests  Nl grip and perf/sens  Lymphadenopathy:    She has no cervical adenopathy.  Neurological: She is alert. She has normal strength and normal reflexes. She displays no atrophy. No sensory deficit. She exhibits normal muscle tone.  Skin: Skin is warm and dry. No rash noted. No erythema. No pallor.  Psychiatric: She has a normal mood and affect.          Assessment & Plan:   Problem List Items Addressed This Visit    Nausea    Pt has n/v around day of ovulation every month -since baby was born 7 mo ago No missed menses (using condoms) Is breastfeeding  Wants to avoid medication  Unsure if progesterone only OC or depo provera would help- she will speak to her OBGYN about it      Right ankle pain    Medial R ankle pain - no known trauma  Fairly nl exam except mild tenderness post to medial malleolus  Is favoring other foot  Recommend ice and elevation (avoiding meds since breastfeeding) Relative rest when able/supportive footwear  Xray today and update      Relevant Orders   DG Ankle Complete Right (Completed)   Wrist pain, right - Primary    Suspect mild flexor tendonitis from holding her baby  Discussed use of a wrist splint (which she has) No s/s of carpal tunnel Ice and relative rest when able -avoiding medication due to breastfeeding currently  Update if no improvement

## 2015-03-14 NOTE — Progress Notes (Signed)
Pre visit review using our clinic review tool, if applicable. No additional management support is needed unless otherwise documented below in the visit note. 

## 2015-06-23 ENCOUNTER — Ambulatory Visit: Payer: Self-pay | Admitting: Family Medicine

## 2015-10-06 ENCOUNTER — Encounter (HOSPITAL_COMMUNITY): Payer: Self-pay | Admitting: *Deleted

## 2015-10-06 ENCOUNTER — Inpatient Hospital Stay (HOSPITAL_COMMUNITY): Payer: Commercial Managed Care - HMO

## 2015-10-06 ENCOUNTER — Inpatient Hospital Stay (HOSPITAL_COMMUNITY)
Admission: AD | Admit: 2015-10-06 | Discharge: 2015-10-06 | Disposition: A | Discharge status: Home / Self Care | Payer: Commercial Managed Care - HMO | Source: Ambulatory Visit | Attending: Obstetrics & Gynecology | Admitting: Obstetrics & Gynecology

## 2015-10-06 DIAGNOSIS — O200 Threatened abortion: Secondary | ICD-10-CM

## 2015-10-06 DIAGNOSIS — Z3A09 9 weeks gestation of pregnancy: Secondary | ICD-10-CM | POA: Diagnosis not present

## 2015-10-06 DIAGNOSIS — O209 Hemorrhage in early pregnancy, unspecified: Secondary | ICD-10-CM

## 2015-10-06 LAB — CBC
HEMATOCRIT: 38.8 % (ref 36.0–46.0)
Hemoglobin: 12.7 g/dL (ref 12.0–15.0)
MCH: 28.2 pg (ref 26.0–34.0)
MCHC: 32.7 g/dL (ref 30.0–36.0)
MCV: 86 fL (ref 78.0–100.0)
Platelets: 256 10*3/uL (ref 150–400)
RBC: 4.51 MIL/uL (ref 3.87–5.11)
RDW: 13.5 % (ref 11.5–15.5)
WBC: 8.6 10*3/uL (ref 4.0–10.5)

## 2015-10-06 LAB — URINE MICROSCOPIC-ADD ON

## 2015-10-06 LAB — WET PREP, GENITAL
Clue Cells Wet Prep HPF POC: NONE SEEN
SPERM: NONE SEEN
Trich, Wet Prep: NONE SEEN
Yeast Wet Prep HPF POC: NONE SEEN

## 2015-10-06 LAB — URINALYSIS, ROUTINE W REFLEX MICROSCOPIC
Bilirubin Urine: NEGATIVE
GLUCOSE, UA: NEGATIVE mg/dL
KETONES UR: 40 mg/dL — AB
LEUKOCYTES UA: NEGATIVE
NITRITE: NEGATIVE
PROTEIN: NEGATIVE mg/dL
Specific Gravity, Urine: >1.03 — ABNORMAL HIGH (ref 1.005–1.030)
pH: 6 (ref 5.0–8.0)

## 2015-10-06 LAB — HCG, QUANTITATIVE, PREGNANCY: hCG, Beta Chain, Quant, S: 17026 m[IU]/mL — ABNORMAL HIGH (ref <5)

## 2015-10-06 LAB — POCT PREGNANCY, URINE: PREG TEST UR: POSITIVE — AB

## 2015-10-06 NOTE — Discharge Instructions (Signed)
Threatened Miscarriage °A threatened miscarriage occurs when you have vaginal bleeding during your first 20 weeks of pregnancy but the pregnancy has not ended. If you have vaginal bleeding during this time, your health care provider will do tests to make sure you are still pregnant. If the tests show you are still pregnant and the developing baby (fetus) inside your womb (uterus) is still growing, your condition is considered a threatened miscarriage. °A threatened miscarriage does not mean your pregnancy will end, but it does increase the risk of losing your pregnancy (complete miscarriage). °CAUSES  °The cause of a threatened miscarriage is usually not known. If you go on to have a complete miscarriage, the most common cause is an abnormal number of chromosomes in the developing baby. Chromosomes are the structures inside cells that hold all your genetic material. °Some causes of vaginal bleeding that do not result in miscarriage include: °· Having sex. °· Having an infection. °· Normal hormone changes of pregnancy. °· Bleeding that occurs when an egg implants in your uterus. °RISK FACTORS °Risk factors for bleeding in early pregnancy include: °· Obesity. °· Smoking. °· Drinking excessive amounts of alcohol or caffeine. °· Recreational drug use. °SIGNS AND SYMPTOMS °· Light vaginal bleeding. °· Mild abdominal pain or cramps. °DIAGNOSIS  °If you have bleeding with or without abdominal pain before 20 weeks of pregnancy, your health care provider will do tests to check whether you are still pregnant. One important test involves using sound waves and a computer (ultrasound) to create images of the inside of your uterus. Other tests include an internal exam of your vagina and uterus (pelvic exam) and measurement of your baby's heart rate.  °You may be diagnosed with a threatened miscarriage if: °· Ultrasound testing shows you are still pregnant. °· Your baby's heart rate is strong. °· A pelvic exam shows that the  opening between your uterus and your vagina (cervix) is closed. °· Your heart rate and blood pressure are stable. °· Blood tests confirm you are still pregnant. °TREATMENT  °No treatments have been shown to prevent a threatened miscarriage from going on to a complete miscarriage. However, the right home care is important.  °HOME CARE INSTRUCTIONS  °· Make sure you keep all your appointments for prenatal care. This is very important. °· Get plenty of rest. °· Do not have sex or use tampons if you have vaginal bleeding. °· Do not douche. °· Do not smoke or use recreational drugs. °· Do not drink alcohol. °· Avoid caffeine. °SEEK MEDICAL CARE IF: °· You have light vaginal bleeding or spotting while pregnant. °· You have abdominal pain or cramping. °· You have a fever. °SEEK IMMEDIATE MEDICAL CARE IF: °· You have heavy vaginal bleeding. °· You have blood clots coming from your vagina. °· You have severe low back pain or abdominal cramps. °· You have fever, chills, and severe abdominal pain. °MAKE SURE YOU: °· Understand these instructions. °· Will watch your condition. °· Will get help right away if you are not doing well or get worse. °  °This information is not intended to replace advice given to you by your health care provider. Make sure you discuss any questions you have with your health care provider. °  °Document Released: 10/18/2005 Document Revised: 10/23/2013 Document Reviewed: 08/14/2013 °Elsevier Interactive Patient Education ©2016 [employer] ° °Vaginal Bleeding During Pregnancy, First Trimester °A small amount of bleeding (spotting) from the vagina is relatively common in early pregnancy. It usually stops on its own.   Various things may cause bleeding or spotting in early pregnancy. Some bleeding may be related to the pregnancy, and some may not. In most cases, the bleeding is normal and is not a problem. However, bleeding can also be a sign of something serious. Be sure to tell your health care provider  about any vaginal bleeding right away. °Some possible causes of vaginal bleeding during the first trimester include: °· Infection or inflammation of the cervix. °· Growths (polyps) on the cervix. °· Miscarriage or threatened miscarriage. °· Pregnancy tissue has developed outside of the uterus and in a fallopian tube (tubal pregnancy). °· Tiny cysts have developed in the uterus instead of pregnancy tissue (molar pregnancy). °HOME CARE INSTRUCTIONS  °Watch your condition for any changes. The following actions may help to lessen any discomfort you are feeling: °· Follow your health care provider's instructions for limiting your activity. If your health care provider orders bed rest, you may need to stay in bed and only get up to use the bathroom. However, your health care provider may allow you to continue light activity. °· If needed, make plans for someone to help with your regular activities and responsibilities while you are on bed rest. °· Keep track of the number of pads you use each day, how often you change pads, and how soaked (saturated) they are. Write this down. °· Do not use tampons. Do not douche. °· Do not have sexual intercourse or orgasms until approved by your health care provider. °· If you pass any tissue from your vagina, save the tissue so you can show it to your health care provider. °· Only take over-the-counter or prescription medicines as directed by your health care provider. °· Do not take aspirin because it can make you bleed. °· Keep all follow-up appointments as directed by your health care provider. °SEEK MEDICAL CARE IF: °· You have any vaginal bleeding during any part of your pregnancy. °· You have cramps or labor pains. °· You have a fever, not controlled by medicine. °SEEK IMMEDIATE MEDICAL CARE IF:  °· You have severe cramps in your back or belly (abdomen). °· You pass large clots or tissue from your vagina. °· Your bleeding increases. °· You feel light-headed or weak, or you have  fainting episodes. °· You have chills. °· You are leaking fluid or have a gush of fluid from your vagina. °· You pass out while having a bowel movement. °MAKE SURE YOU: °· Understand these instructions. °· Will watch your condition. °· Will get help right away if you are not doing well or get worse. °  °This information is not intended to replace advice given to you by your health care provider. Make sure you discuss any questions you have with your health care provider. °  °Document Released: 07/28/2005 Document Revised: 10/23/2013 Document Reviewed: 06/25/2013 °Elsevier Interactive Patient Education ©2016 Elsevier Inc. ° °

## 2015-10-06 NOTE — MAU Note (Signed)
Pt presents to MAU with complaints of vaginal bleeding that started last week. + home pregnancy test last Wednesday.

## 2015-10-06 NOTE — MAU Provider Note (Signed)
Chief Complaint: Vaginal Bleeding   First Provider Initiated Contact with Patient 10/06/15 1307      SUBJECTIVE HPI: Alyssa Gutierrez is a 29 y.o. G2P1001 at [redacted]w[redacted]d by LMP who presents to maternity admissions reporting onset of brown vaginal bleeding last week while on a cruise with her husband, with light spotting off and on until today when she had bright red bleeding like menses.  She reports using 1 pad in last 2-3 hours.  She has not tried anything for bleeding and reports nothing improves or worsens her symptoms.  Patient's last menstrual period was 08/03/2015. She is sure of this date and has regular periods. She denies abdominal pain, vaginal itching/burning, urinary symptoms, h/a, dizziness, n/v, or fever/chills.     HPI  Past Medical History  Diagnosis Date  . History of chicken pox     age 25  . History of headache   . History of allergy   . Family history of heart murmur     at birth  . History of migraine   . IBS (irritable bowel syndrome)    Past Surgical History  Procedure Laterality Date  . Wisdom tooth extraction      age 22  . Root canal      age 29   Social History   Social History  . Marital Status: Married    Spouse Name: N/A  . Number of Children: 0  . Years of Education: N/A   Occupational History  . facilities coordinator    Social History Main Topics  . Smoking status: Never Smoker   . Smokeless tobacco: Never Used  . Alcohol Use: No  . Drug Use: No  . Sexual Activity: Yes    Birth Control/ Protection: None   Other Topics Concern  . Not on file   Social History Narrative   No current facility-administered medications on file prior to encounter.   Current Outpatient Prescriptions on File Prior to Encounter  Medication Sig Dispense Refill  . Prenatal Vit-Fe Fumarate-FA (PRENATAL MULTIVITAMIN) TABS tablet Take 1 tablet by mouth daily at 12 noon.     No Known Allergies  ROS:  Review of Systems  Constitutional: Negative for fever,  chills and fatigue.  HENT: Negative for sinus pressure.   Eyes: Negative for photophobia.  Respiratory: Negative for shortness of breath.   Cardiovascular: Negative for chest pain.  Gastrointestinal: Negative for nausea, vomiting, diarrhea and constipation.  Genitourinary: Negative for dysuria, frequency, flank pain, vaginal bleeding, vaginal discharge, difficulty urinating, vaginal pain and pelvic pain.  Musculoskeletal: Negative for neck pain.  Neurological: Negative for dizziness, weakness and headaches.  Psychiatric/Behavioral: Negative.      I have reviewed patient's Past Medical Hx, Surgical Hx, Family Hx, Social Hx, medications and allergies.   Physical Exam   Patient Vitals for the past 24 hrs:  BP Temp Pulse Resp Height Weight  10/06/15 1512 121/74 mmHg - 94 18 - -  10/06/15 1225 130/78 mmHg 98.3 F (36.8 C) 100 18  (1.6 m) 120 lb (54.432 kg)   Constitutional: Well-developed, well-nourished female in no acute distress.  Cardiovascular: normal rate Respiratory: normal effort GI: Abd soft, non-tender. Pos BS x 4 MS: Extremities nontender, no edema, normal ROM Neurologic: Alert and oriented x 4.  GU: Neg CVAT.  PELVIC EXAM: Cervix pink, visually closed, without lesion, small amount dark red bleeding, vaginal walls and external genitalia normal Bimanual exam: Cervix 0/long/high, firm, anterior, neg CMT, uterus nontender, nonenlarged, adnexa without tenderness, enlargement, or  mass   LAB RESULTS Results for orders placed or performed during the hospital encounter of 10/06/15 (from the past 24 hour(s))  Urinalysis, Routine w reflex microscopic (not at [facility])     Status: Abnormal   Collection Time: 10/06/15 12:24 PM  Result Value Ref Range   Color, Urine YELLOW YELLOW   APPearance HAZY (A) CLEAR   Specific Gravity, Urine >1.030 (H) 1.005 - 1.030   pH 6.0 5.0 - 8.0   Glucose, UA NEGATIVE NEGATIVE mg/dL   Hgb urine dipstick LARGE (A) NEGATIVE   Bilirubin Urine  NEGATIVE NEGATIVE   Ketones, ur 40 (A) NEGATIVE mg/dL   Protein, ur NEGATIVE NEGATIVE mg/dL   Nitrite NEGATIVE NEGATIVE   Leukocytes, UA NEGATIVE NEGATIVE  Urine microscopic-add on     Status: Abnormal   Collection Time: 10/06/15 12:24 PM  Result Value Ref Range   Squamous Epithelial / LPF 0-5 (A) NONE SEEN   WBC, UA 0-5 0 - 5 WBC/hpf   RBC / HPF TOO NUMEROUS TO COUNT 0 - 5 RBC/hpf   Bacteria, UA RARE (A) NONE SEEN  Pregnancy, urine POC     Status: Abnormal   Collection Time: 10/06/15 12:36 PM  Result Value Ref Range   Preg Test, Ur POSITIVE (A) NEGATIVE  Wet prep, genital     Status: Abnormal   Collection Time: 10/06/15  1:08 PM  Result Value Ref Range   Yeast Wet Prep HPF POC NONE SEEN NONE SEEN   Trich, Wet Prep NONE SEEN NONE SEEN   Clue Cells Wet Prep HPF POC NONE SEEN NONE SEEN   WBC, Wet Prep HPF POC FEW (A) NONE SEEN   Sperm NONE SEEN   CBC     Status: None   Collection Time: 10/06/15  1:36 PM  Result Value Ref Range   WBC 8.6 4.0 - 10.5 K/uL   RBC 4.51 3.87 - 5.11 MIL/uL   Hemoglobin 12.7 12.0 - 15.0 g/dL   HCT 16.1 09.6 - 04.5 %   MCV 86.0 78.0 - 100.0 fL   MCH 28.2 26.0 - 34.0 pg   MCHC 32.7 30.0 - 36.0 g/dL   RDW 40.9 81.1 - 91.4 %   Platelets 256 150 - 400 K/uL  hCG, quantitative, pregnancy     Status: Abnormal   Collection Time: 10/06/15  1:36 PM  Result Value Ref Range   hCG, Beta Chain, Quant, S 17026 (H) <5 mIU/mL   Blood type O positive  IMAGING US Ob Comp Less 14 Wks  10/06/2015  CLINICAL DATA:  Initial encounter for 1 week history of vaginal bleeding with positive pregnancy test. EXAM: OBSTETRIC <14 WK [place] AND TRANSVAGINAL OB US TECHNIQUE: Both transabdominal and transvaginal ultrasound examinations were performed for complete evaluation of the gestation as well as the maternal uterus, adnexal regions, and pelvic cul-de-sac. Transvaginal technique was performed to assess early pregnancy. COMPARISON:  None. FINDINGS: Intrauterine gestational sac: Single.  Yolk sac:  Nonvisualized. Embryo:  Probable. Cardiac Activity: Not visualized. CRL:  0.48 cm  Mm   6 w   1 d                  [place] EDC: 05/30/2016 Maternal uterus/adnexae: No evidence for adnexal mass. Tiny subchorionic hemorrhage is evident. The sonographer reports a trace amount of free fluid in the cul-de-sac. IMPRESSION: Imaging features suspicious for, but not diagnostic of nonviable pregnancy given the lack of detectable embryonic heart activity. Follow-up ultrasound in 7-10 days could be used to assess  for progression of pregnancy. Electronically Signed   By: Kennith CenterEric  Mansell M.D.   On: 10/06/2015 14:35   [place] Ob Transvaginal  10/06/2015  CLINICAL DATA:  Initial encounter for 1 week history of vaginal bleeding with positive pregnancy test. EXAM: OBSTETRIC <14 WK US AND TRANSVAGINAL OB US TECHNIQUE: Both transabdominal and transvaginal ultrasound examinations were performed for complete evaluation of the gestation as well as the maternal uterus, adnexal regions, and pelvic cul-de-sac. Transvaginal technique was performed to assess early pregnancy. COMPARISON:  None. FINDINGS: Intrauterine gestational sac: Single. Yolk sac:  Nonvisualized. Embryo:  Probable. Cardiac Activity: Not visualized. CRL:  0.48 cm  Mm   6 w   1 d                  US EDC: 05/30/2016 Maternal uterus/adnexae: No evidence for adnexal mass. Tiny subchorionic hemorrhage is evident. The sonographer reports a trace amount of free fluid in the cul-de-sac. IMPRESSION: Imaging features suspicious for, but not diagnostic of nonviable pregnancy given the lack of detectable embryonic heart activity. Follow-up ultrasound in 7-10 days could be used to assess for progression of pregnancy. Electronically Signed   By: Kennith CenterEric  Mansell M.D.   On: 10/06/2015 14:35    MAU Management/MDM: Ordered labs and reviewed results.  Likely failed pregnancy but not definitive.  Embryo present so ectopic pregnancy ruled out.  Discussed poor prognosis with pt, given  opportunity to ask questions.  Bleeding precautions given, reasons to return to hospital.  Plan for outpatient US in 10 days.  Pt stable at time of discharge.  ASSESSMENT 1. Threatened miscarriage in early pregnancy   2. Vaginal bleeding in pregnancy, first trimester     PLAN Discharge home with bleeding precautions F/U with outpatient US Return to MAU as needed for emergencies    Medication List    TAKE these medications        prenatal multivitamin Tabs tablet  Take 1 tablet by mouth daily at 12 noon.       Follow-up Information    [facility]   Specialty:  Radiology   Why:  Ultrasound will call you with appointment., Return to MAU as needed for emergencies   Contact information:   [address] [id number] [place] [id number] [phone]      Sharen CounterLisa Leftwich-Kirby Certified Nurse-Midwife 10/06/2015  7:51 PM

## 2015-10-07 ENCOUNTER — Encounter (HOSPITAL_COMMUNITY): Payer: Self-pay | Admitting: *Deleted

## 2015-10-07 ENCOUNTER — Inpatient Hospital Stay (HOSPITAL_COMMUNITY)
Admission: AD | Admit: 2015-10-07 | Discharge: 2015-10-07 | Disposition: A | Discharge status: Home / Self Care | Payer: Commercial Managed Care - HMO | Source: Ambulatory Visit | Attending: Family Medicine | Admitting: Family Medicine

## 2015-10-07 DIAGNOSIS — O039 Complete or unspecified spontaneous abortion without complication: Secondary | ICD-10-CM

## 2015-10-07 DIAGNOSIS — O209 Hemorrhage in early pregnancy, unspecified: Secondary | ICD-10-CM | POA: Diagnosis present

## 2015-10-07 LAB — URINALYSIS, ROUTINE W REFLEX MICROSCOPIC
Glucose, UA: NEGATIVE mg/dL
Ketones, ur: 40 mg/dL — AB
Leukocytes, UA: NEGATIVE
Nitrite: NEGATIVE
PROTEIN: NEGATIVE mg/dL
pH: 5.5 (ref 5.0–8.0)

## 2015-10-07 LAB — CBC
HCT: 38.3 % (ref 36.0–46.0)
Hemoglobin: 12.6 g/dL (ref 12.0–15.0)
MCH: 28.6 pg (ref 26.0–34.0)
MCHC: 32.9 g/dL (ref 30.0–36.0)
MCV: 86.8 fL (ref 78.0–100.0)
Platelets: 245 10*3/uL (ref 150–400)
RBC: 4.41 MIL/uL (ref 3.87–5.11)
RDW: 13.5 % (ref 11.5–15.5)
WBC: 15.5 10*3/uL — ABNORMAL HIGH (ref 4.0–10.5)

## 2015-10-07 LAB — HIV ANTIBODY (ROUTINE TESTING W REFLEX): HIV SCREEN 4TH GENERATION: NONREACTIVE

## 2015-10-07 LAB — URINE MICROSCOPIC-ADD ON

## 2015-10-07 LAB — GC/CHLAMYDIA PROBE AMP (~~LOC~~) NOT AT ARMC
CHLAMYDIA, DNA PROBE: NEGATIVE
NEISSERIA GONORRHEA: NEGATIVE

## 2015-10-07 LAB — HCG, QUANTITATIVE, PREGNANCY: hCG, Beta Chain, Quant, S: 6027 m[IU]/mL — ABNORMAL HIGH (ref <5)

## 2015-10-07 MED ORDER — LACTATED RINGERS IV BOLUS (SEPSIS)
1000.0000 mL | Freq: Once | INTRAVENOUS | Status: AC
  Administered 2015-10-07: 1000 mL via INTRAVENOUS

## 2015-10-07 MED ORDER — PROMETHAZINE HCL 25 MG PO TABS: 12.5000 mg | ORAL_TABLET | Freq: Four times a day (QID) | ORAL | Status: DC | PRN

## 2015-10-07 NOTE — Discharge Instructions (Signed)
Miscarriage  A miscarriage is the sudden loss of an unborn baby (fetus) before the 20th week of pregnancy. Most miscarriages happen in the first 3 months of pregnancy. Sometimes, it happens before a woman even knows she is pregnant. A miscarriage is also called a "spontaneous miscarriage" or "early pregnancy loss." Having a miscarriage can be an emotional experience. Talk with your caregiver about any questions you may have about miscarrying, the grieving process, and your future pregnancy plans.  CAUSES    Problems with the fetal chromosomes that make it impossible for the baby to develop normally. Problems with the baby's genes or chromosomes are most often the result of errors that occur, by chance, as the embryo divides and grows. The problems are not inherited from the parents.   Infection of the cervix or uterus.    Hormone problems.    Problems with the cervix, such as having an incompetent cervix. This is when the tissue in the cervix is not strong enough to hold the pregnancy.    Problems with the uterus, such as an abnormally shaped uterus, uterine fibroids, or congenital abnormalities.    Certain medical conditions.    Smoking, drinking alcohol, or taking illegal drugs.    Trauma.   Often, the cause of a miscarriage is unknown.   SYMPTOMS    Vaginal bleeding or spotting, with or without cramps or pain.   Pain or cramping in the abdomen or lower back.   Passing fluid, tissue, or blood clots from the vagina.  DIAGNOSIS   Your caregiver will perform a physical exam. You may also have an ultrasound to confirm the miscarriage. Blood or urine tests may also be ordered.  TREATMENT    Sometimes, treatment is not necessary if you naturally pass all the fetal tissue that was in the uterus. If some of the fetus or placenta remains in the body (incomplete miscarriage), tissue left behind may become infected and must be removed. Usually, a dilation and curettage (D and C) procedure is performed.  During a D and C procedure, the cervix is widened (dilated) and any remaining fetal or placental tissue is gently removed from the uterus.   Antibiotic medicines are prescribed if there is an infection. Other medicines may be given to reduce the size of the uterus (contract) if there is a lot of bleeding.   If you have Rh negative blood and your baby was Rh positive, you will need a Rh immunoglobulin shot. This shot will protect any future baby from having Rh blood problems in future pregnancies.  HOME CARE INSTRUCTIONS    Your caregiver may order bed rest or may allow you to continue light activity. Resume activity as directed by your caregiver.   Have someone help with home and family responsibilities during this time.    Keep track of the number of sanitary pads you use each day and how soaked (saturated) they are. Write down this information.    Do not use tampons. Do not douche or have sexual intercourse until approved by your caregiver.    Only take over-the-counter or prescription medicines for pain or discomfort as directed by your caregiver.    Do not take aspirin. Aspirin can cause bleeding.    Keep all follow-up appointments with your caregiver.    If you or your partner have problems with grieving, talk to your caregiver or seek counseling to help cope with the pregnancy loss. Allow enough time to grieve before trying to get pregnant again.     SEEK IMMEDIATE MEDICAL CARE IF:    You have severe cramps or pain in your back or abdomen.   You have a fever.   You pass large blood clots (walnut-sized or larger) ortissue from your vagina. Save any tissue for your caregiver to inspect.    Your bleeding increases.    You have a thick, bad-smelling vaginal discharge.   You become lightheaded, weak, or you faint.    You have chills.   MAKE SURE YOU:   Understand these instructions.   Will watch your condition.   Will get help right away if you are not doing well or get worse.     This  information is not intended to replace advice given to you by your health care provider. Make sure you discuss any questions you have with your health care provider.     Document Released: 04/13/2001 Document Revised: 02/12/2013 Document Reviewed: 12/07/2011  Elsevier Interactive Patient Education 2016 [employer]

## 2015-10-07 NOTE — MAU Provider Note (Signed)
Chief Complaint: Vaginal Bleeding   First Provider Initiated Contact with Patient 10/07/15 1233      SUBJECTIVE HPI: Alyssa Gutierrez is a 29 y.o. G2P1001 at 816w2d by LMP who presents to maternity admissions reporting onset of cramping followed by heavy vaginal bleeding last night and weakness with mild dizziness today.  Her bleeding is now light/scant today.  She was seen in MAU yesterday and diagnosed with threatened miscarriage with US suspicious for but not diagnostic for SAB.  She has not tried anything for bleeding or pain.  Nothing makes the bleeding better or worse.  She denies pain currently.   She denies vaginal itching/burning, urinary symptoms, h/a, dizziness, n/v, or fever/chills.    Quant hcg on 12/5 was 17,026  HPI  Past Medical History  Diagnosis Date  . History of chicken pox     age [age]  . History of headache   . History of allergy   . Family history of heart murmur     at birth  . History of migraine   . IBS (irritable bowel syndrome)    Past Surgical History  Procedure Laterality Date  . Wisdom tooth extraction      age 29  . Root canal      age 29   Social History   Social History  . Marital Status: Married    Spouse Name: N/A  . Number of Children: 0  . Years of Education: N/A   Occupational History  . facilities coordinator    Social History Main Topics  . Smoking status: Never Smoker   . Smokeless tobacco: Never Used  . Alcohol Use: No  . Drug Use: No  . Sexual Activity: Yes    Birth Control/ Protection: None   Other Topics Concern  . Not on file   Social History Narrative   No current facility-administered medications on file prior to encounter.   Current Outpatient Prescriptions on File Prior to Encounter  Medication Sig Dispense Refill  . Prenatal Vit-Fe Fumarate-FA (PRENATAL MULTIVITAMIN) TABS tablet Take 1 tablet by mouth daily at 12 noon.     No Known Allergies  ROS:  Review of Systems  Constitutional: Negative for fever,  chills and fatigue.  HENT: Negative for sinus pressure.   Eyes: Negative for photophobia.  Respiratory: Negative for shortness of breath.   Cardiovascular: Negative for chest pain.  Gastrointestinal: Negative for nausea, vomiting, diarrhea and constipation.  Genitourinary: Negative for dysuria, frequency, flank pain, vaginal bleeding, vaginal discharge, difficulty urinating, vaginal pain and pelvic pain.  Musculoskeletal: Negative for neck pain.  Neurological: Negative for dizziness, weakness and headaches.  Psychiatric/Behavioral: Negative.      I have reviewed patient's Past Medical Hx, Surgical Hx, Family Hx, Social Hx, medications and allergies.   Physical Exam   Patient Vitals for the past 24 hrs:  BP Temp Temp src Pulse Resp  10/07/15 1145 108/64 mmHg 98.3 F (36.8 C) Oral 102 16   Constitutional: Well-developed, well-nourished female in no acute distress.  Cardiovascular: normal rate Respiratory: normal effort GI: Abd soft, non-tender. Pos BS x 4 MS: Extremities nontender, no edema, normal ROM Neurologic: Alert and oriented x 4.  GU: Neg CVAT.  PELVIC EXAM: Deferred.    Scant bleeding on pad in MAU  LAB RESULTS Results for orders placed or performed during the hospital encounter of 10/07/15 (from the past 24 hour(s))  Urinalysis, Routine w reflex microscopic (not at [facility])     Status: Abnormal   Collection Time: 10/07/15  11:48 AM  Result Value Ref Range   Color, Urine AMBER (A) YELLOW   APPearance HAZY (A) CLEAR   Specific Gravity, Urine >1.030 (H) 1.005 - 1.030   pH 5.5 5.0 - 8.0   Glucose, UA NEGATIVE NEGATIVE mg/dL   Hgb urine dipstick LARGE (A) NEGATIVE   Bilirubin Urine SMALL (A) NEGATIVE   Ketones, ur 40 (A) NEGATIVE mg/dL   Protein, ur NEGATIVE NEGATIVE mg/dL   Nitrite NEGATIVE NEGATIVE   Leukocytes, UA NEGATIVE NEGATIVE  Urine microscopic-add on     Status: Abnormal   Collection Time: 10/07/15 11:48 AM  Result Value Ref Range   Squamous Epithelial /  LPF 6-30 (A) NONE SEEN   WBC, UA 0-5 0 - 5 WBC/hpf   RBC / HPF TOO NUMEROUS TO COUNT 0 - 5 RBC/hpf   Bacteria, UA FEW (A) NONE SEEN   Urine-Other MUCOUS PRESENT   hCG, quantitative, pregnancy     Status: Abnormal   Collection Time: 10/07/15  1:20 PM  Result Value Ref Range   hCG, Beta Chain, Quant, S 6027 (H) <5 mIU/mL  CBC     Status: Abnormal   Collection Time: 10/07/15  1:20 PM  Result Value Ref Range   WBC 15.5 (H) 4.0 - 10.5 K/uL   RBC 4.41 3.87 - 5.11 MIL/uL   Hemoglobin 12.6 12.0 - 15.0 g/dL   HCT 16.1 09.6 - 04.5 %   MCV 86.8 78.0 - 100.0 fL   MCH 28.6 26.0 - 34.0 pg   MCHC 32.9 30.0 - 36.0 g/dL   RDW 40.9 81.1 - 91.4 %   Platelets 245 150 - 400 K/uL       IMAGING US Ob Comp Less 14 Wks  10/06/2015  CLINICAL DATA:  Initial encounter for 1 week history of vaginal bleeding with positive pregnancy test. EXAM: OBSTETRIC <14 WK [place] AND TRANSVAGINAL OB US TECHNIQUE: Both transabdominal and transvaginal ultrasound examinations were performed for complete evaluation of the gestation as well as the maternal uterus, adnexal regions, and pelvic cul-de-sac. Transvaginal technique was performed to assess early pregnancy. COMPARISON:  None. FINDINGS: Intrauterine gestational sac: Single. Yolk sac:  Nonvisualized. Embryo:  Probable. Cardiac Activity: Not visualized. CRL:  0.48 cm  Mm   6 w   1 d                  [place] EDC: 05/30/2016 Maternal uterus/adnexae: No evidence for adnexal mass. Tiny subchorionic hemorrhage is evident. The sonographer reports a trace amount of free fluid in the cul-de-sac. IMPRESSION: Imaging features suspicious for, but not diagnostic of nonviable pregnancy given the lack of detectable embryonic heart activity. Follow-up ultrasound in 7-10 days could be used to assess for progression of pregnancy. Electronically Signed   By: Kennith Center M.D.   On: 10/06/2015 14:35   US Ob Transvaginal  10/06/2015  CLINICAL DATA:  Initial encounter for 1 week history of vaginal bleeding  with positive pregnancy test. EXAM: OBSTETRIC <14 WK [place] AND TRANSVAGINAL OB US TECHNIQUE: Both transabdominal and transvaginal ultrasound examinations were performed for complete evaluation of the gestation as well as the maternal uterus, adnexal regions, and pelvic cul-de-sac. Transvaginal technique was performed to assess early pregnancy. COMPARISON:  None. FINDINGS: Intrauterine gestational sac: Single. Yolk sac:  Nonvisualized. Embryo:  Probable. Cardiac Activity: Not visualized. CRL:  0.48 cm  Mm   6 w   1 d                  [place]  EDC: 05/30/2016 Maternal uterus/adnexae: No evidence for adnexal mass. Tiny subchorionic hemorrhage is evident. The sonographer reports a trace amount of free fluid in the cul-de-sac. IMPRESSION: Imaging features suspicious for, but not diagnostic of nonviable pregnancy given the lack of detectable embryonic heart activity. Follow-up ultrasound in 7-10 days could be used to assess for progression of pregnancy. Electronically Signed   By: Kennith Center M.D.   On: 10/06/2015 14:35    MAU Management/MDM: Ordered labs and reviewed results.  Significant drop in quant hcg indicates SAB, likely complete.  Rx for antiemetic medication and plan for follow up in clinic in 2 weeks.  Pt stable at time of discharge.  ASSESSMENT 1. SAB (spontaneous abortion)     PLAN Discharge home with bleeding precautions Phenergan 12.5-25 mg PO Q 6 hours PRN F/U in clinic in 2 weeks   Follow-up Information    Follow up with [facility].   Specialty:  Obstetrics and Gynecology   Why:  The clinic will call you with appointment in 2 weeks. , Return to MAU as needed for emergencies   Contact information:   [address] [place] [address] [phone]      Sharen Counter Certified Nurse-Midwife 10/07/2015  2:36 PM

## 2015-10-07 NOTE — MAU Note (Signed)
Pt seen in MAU yesterday for bleeding, had episode of heavy bleeding last night - passing clots.  Bleeding episode lasted 30-45 minutes.  Still bleeding this morning but not as much, feels very weak & nauseated, denies dizziness.

## 2015-10-07 NOTE — MAU Provider Note (Signed)
History    Chief Complaint  Patient presents with  . Vaginal Bleeding  Alyssa Gutierrez is a 29yo G2P1001 at 6448w2d who presents with heavy vaginal bleeding last night. She was seen in the MAU yesterday for bleeding likely in the setting of a threatened abortion based on U/S. She was discharged and scheduled for a follow-up U/S in 10 days. When she went home last night, she started to have abdominal cramping and noticed that her underwear felt wet. She went to the bathroom and noted that her underwear was covered in blood. She then proceeded to pass large clots followed by streams of blood. She reported that this pattern occurred over a span of 30-45 minutes. She continued to contract every couple of hours throuhgout the night with lighter bleeding. Currently she only has light streaks of blood while wiping. Last night she felt weak and nauseated at the time but did not vomit. She reports continued nausea into the morning. She did not try anything for the bleeding. She reports that this morning she felt lightheaded when standing from a lying position, which is only relived by lying down.    HPI  OB History    Gravida Para Term Preterm AB TAB SAB Ectopic Multiple Living   2 1 1  0 0 0 0 0 0 1      Past Medical History  Diagnosis Date  . History of chicken pox     age 29  . History of headache   . History of allergy   . Family history of heart murmur     at birth  . History of migraine   . IBS (irritable bowel syndrome)     Past Surgical History  Procedure Laterality Date  . Wisdom tooth extraction      age 29  . Root canal      age 29    Family History  Problem Relation Age of Onset  . Diabetes Paternal Grandmother   . Hypertension Father   . Heart murmur Brother   . Colon cancer Neg Hx   . Hypertension Paternal Uncle   . Crohn's disease Maternal Grandmother     Social History  Substance Use Topics  . Smoking status: Never Smoker   . Smokeless tobacco: Never Used  .  Alcohol Use: No    Allergies: No Known Allergies  Prescriptions prior to admission  Medication Sig Dispense Refill Last Dose  . Prenatal Vit-Fe Fumarate-FA (PRENATAL MULTIVITAMIN) TABS tablet Take 1 tablet by mouth daily at 12 noon.   10/04/2015    ROS Physical Exam Blood pressure 108/64, pulse 102, temperature 98.3 F (36.8 C), temperature source Oral, resp. rate 16, last menstrual period 08/03/2015, currently breastfeeding. Physical Exam  Constitutional:  Alert, well appearing lying in bed in no acute distress  HENT:  Head: Normocephalic and atraumatic.  Respiratory: Effort normal.  Genitourinary:  Deferred as patient reported bleeding had slowed  Neurological: She is alert.  Skin: She is not diaphoretic. No pallor.  Psychiatric: She has a normal mood and affect.    MAU Course Procedures  Results for orders placed or performed during the hospital encounter of 10/07/15 (from the past 24 hour(s))  Urinalysis, Routine w reflex microscopic (not at [facility])     Status: Abnormal   Collection Time: 10/07/15 11:48 AM  Result Value Ref Range   Color, Urine AMBER (A) YELLOW   APPearance HAZY (A) CLEAR   Specific Gravity, Urine >1.030 (H) 1.005 - 1.030   pH 5.5  5.0 - 8.0   Glucose, UA NEGATIVE NEGATIVE mg/dL   Hgb urine dipstick LARGE (A) NEGATIVE   Bilirubin Urine SMALL (A) NEGATIVE   Ketones, ur 40 (A) NEGATIVE mg/dL   Protein, ur NEGATIVE NEGATIVE mg/dL   Nitrite NEGATIVE NEGATIVE   Leukocytes, UA NEGATIVE NEGATIVE  Urine microscopic-add on     Status: Abnormal   Collection Time: 10/07/15 11:48 AM  Result Value Ref Range   Squamous Epithelial / LPF 6-30 (A) NONE SEEN   WBC, UA 0-5 0 - 5 WBC/hpf   RBC / HPF TOO NUMEROUS TO COUNT 0 - 5 RBC/hpf   Bacteria, UA FEW (A) NONE SEEN   Urine-Other MUCOUS PRESENT   CBC     Status: Abnormal   Collection Time: 10/07/15  1:20 PM  Result Value Ref Range   WBC 15.5 (H) 4.0 - 10.5 K/uL   RBC 4.41 3.87 - 5.11 MIL/uL   Hemoglobin 12.6  12.0 - 15.0 g/dL   HCT 56.2 13.0 - 86.5 %   MCV 86.8 78.0 - 100.0 fL   MCH 28.6 26.0 - 34.0 pg   MCHC 32.9 30.0 - 36.0 g/dL   RDW 78.4 69.6 - 29.5 %   Platelets 245 150 - 400 K/uL    Assessment and Plan Alyssa Gutierrez is a 29yo G2P1001 at [redacted]w[redacted]d presenting with heavy vaginal bleeding consistent with a SAB. The ultrasound results from yesterday in the MAU suggested a threatened abortion and the passage of thick clots and bright red blood last night are consistent with SAB. CBC showed a Hgb of 12.6 today, down from 12.7 yesterday which is reassuring against anemia due to significant blood loss given reported symptoms. A beat hCG was found to be 6027, down from 17602 yesterday which is consistent with miscarriage. At this point ultrasound will be deferred. Patient will follow-up in two weeks with clinic week to confirm loss of pregnancy and ensure bleeding has stopped.  Cynda Familia, MS3 10/07/2015 01:59PM

## 2015-10-08 ENCOUNTER — Telehealth: Payer: Self-pay | Admitting: General Practice

## 2015-10-08 ENCOUNTER — Ambulatory Visit: Payer: Self-pay | Admitting: Obstetrics & Gynecology

## 2015-10-08 NOTE — Telephone Encounter (Signed)
Patient's husband called and left message stating his wife was seen here recently for miscarriage and she has been having very bad contractions to the point where she is crying. They request callback as to what they can do. Called patient and she states the cramping has slowed down at the moment. Patient states she is still having bleeding when she is cramping. Recommended she try ibuprofen 800mg  every 6-8 hours if she needs it. Told patient if the pain becomes severe and motrin does not help she should go back to MAU. Patient verbalized understanding and had no other questions

## 2015-10-16 ENCOUNTER — Ambulatory Visit (HOSPITAL_COMMUNITY): Payer: Commercial Managed Care - HMO

## 2015-10-23 ENCOUNTER — Ambulatory Visit (INDEPENDENT_AMBULATORY_CARE_PROVIDER_SITE_OTHER): Payer: Commercial Managed Care - HMO | Admitting: Certified Nurse Midwife

## 2015-10-23 ENCOUNTER — Encounter: Payer: Self-pay | Admitting: Certified Nurse Midwife

## 2015-10-23 VITALS — BP 114/75 | HR 70 | Temp 98.2°F | Ht 63.0 in | Wt 119.0 lb

## 2015-10-23 DIAGNOSIS — O039 Complete or unspecified spontaneous abortion without complication: Secondary | ICD-10-CM | POA: Diagnosis not present

## 2015-10-24 LAB — HCG, QUANTITATIVE, PREGNANCY: hCG, Beta Chain, Quant, S: 4.3 m[IU]/mL

## 2015-10-31 NOTE — Progress Notes (Signed)
CC: Follow-up    None     HPI Alyssa Gutierrez is a 29 y.o. G2P1001 seen in the office today after SAB. Expereincing no problems ; no bleeding  Past Medical History  Diagnosis Date  . History of chicken pox     age 64  . History of headache   . History of allergy   . Family history of heart murmur     at birth  . History of migraine   . IBS (irritable bowel syndrome)     OB History  Gravida Para Term Preterm AB SAB TAB Ectopic Multiple Living  0 0 0 0 0 0 1    # Outcome Date GA Lbr Len/2nd Weight Sex Delivery Anes PTL Lv  2 Gravida           1 Term 08/07/14 [redacted]w[redacted]d 06:04 / 01:25 3.935 kg (8 lb 10.8 oz) M Vag-Spont Local,Pud  Y      Past Surgical History  Procedure Laterality Date  . Wisdom tooth extraction      age 44  . Root canal      age 85    Social History   Social History  . Marital Status: Married    Spouse Name: N/A  . Number of Children: 0  . Years of Education: N/A   Occupational History  . facilities coordinator    Social History Main Topics  . Smoking status: Never Smoker   . Smokeless tobacco: Never Used  . Alcohol Use: No  . Drug Use: No  . Sexual Activity: Yes    Birth Control/ Protection: None   Other Topics Concern  . Not on file   Social History Narrative    Current Outpatient Prescriptions on File Prior to Visit  Medication Sig Dispense Refill  . Prenatal Vit-Fe Fumarate-FA (PRENATAL MULTIVITAMIN) TABS tablet Take 1 tablet by mouth daily at 12 noon.    . promethazine (PHENERGAN) 25 MG tablet Take 0.5-1 tablets (12.5-25 mg total) by mouth every 6 (six) hours as needed for nausea. 30 tablet 0   No current facility-administered medications on file prior to visit.    No Known Allergies   ROS Pertinent items in HPI   PHYSICAL EXAM Filed Vitals:   10/23/15 1524  BP: 114/75  Pulse: 70  Temp: 98.2 F (36.8 C)   General: Well nourished, well developed female in no acute distress Cardiovascular: Normal  rate Respiratory: Normal effort Abdomen: Soft, nontender Back: No CVAT Extremities: No edema Neurologic:grossly intact    IMAGING US Ob Comp Less 14 Wks  10/06/2015  CLINICAL DATA:  Initial encounter for 1 week history of vaginal bleeding with positive pregnancy test. EXAM: OBSTETRIC <14 WK [place] AND TRANSVAGINAL OB US TECHNIQUE: Both transabdominal and transvaginal ultrasound examinations were performed for complete evaluation of the gestation as well as the maternal uterus, adnexal regions, and pelvic cul-de-sac. Transvaginal technique was performed to assess early pregnancy. COMPARISON:  None. FINDINGS: Intrauterine gestational sac: Single. Yolk sac:  Nonvisualized. Embryo:  Probable. Cardiac Activity: Not visualized. CRL:  0.48 cm  Mm   6 w   1 d                  [place] EDC: 05/30/2016 Maternal uterus/adnexae: No evidence for adnexal mass. Tiny subchorionic hemorrhage is evident. The sonographer reports a trace amount of free fluid in the cul-de-sac. IMPRESSION: Imaging features suspicious for, but not diagnostic of nonviable pregnancy given the lack of detectable embryonic heart activity.  Follow-up ultrasound in 7-10 days could be used to assess for progression of pregnancy. Electronically Signed   By: Kennith CenterEric  Mansell M.D.   On: 10/06/2015 14:35   [place] Ob Transvaginal  10/06/2015  CLINICAL DATA:  Initial encounter for 1 week history of vaginal bleeding with positive pregnancy test. EXAM: OBSTETRIC <14 WK US AND TRANSVAGINAL OB US TECHNIQUE: Both transabdominal and transvaginal ultrasound examinations were performed for complete evaluation of the gestation as well as the maternal uterus, adnexal regions, and pelvic cul-de-sac. Transvaginal technique was performed to assess early pregnancy. COMPARISON:  None. FINDINGS: Intrauterine gestational sac: Single. Yolk sac:  Nonvisualized. Embryo:  Probable. Cardiac Activity: Not visualized. CRL:  0.48 cm  Mm   6 w   1 d                  US EDC: 05/30/2016 Maternal  uterus/adnexae: No evidence for adnexal mass. Tiny subchorionic hemorrhage is evident. The sonographer reports a trace amount of free fluid in the cul-de-sac. IMPRESSION: Imaging features suspicious for, but not diagnostic of nonviable pregnancy given the lack of detectable embryonic heart activity. Follow-up ultrasound in 7-10 days could be used to assess for progression of pregnancy. Electronically Signed   By: Kennith CenterEric  Mansell M.D.   On: 10/06/2015 14:35        ASSESSMENT  1. SAB (spontaneous abortion)     PLAN Pt plans to try to get pregnant again. Told her to continue prenatal vitamins and folic acid    Medication List       This list is accurate as of: 10/23/15 11:59 PM.  Always use your most recent med list.               prenatal multivitamin Tabs tablet  Take 1 tablet by mouth daily at 12 noon.     promethazine 25 MG tablet  Commonly known as:  PHENERGAN  Take 0.5-1 tablets (12.5-25 mg total) by mouth every 6 (six) hours as needed for nausea.          Rhea PinkLori A Clemmons, CNM 10/23/15

## 2015-11-02 NOTE — L&D Delivery Note (Signed)
Delivery Note At 6:14 AM a viable female was delivered via Vaginal, Spontaneous Delivery (Presentation: vertex; OA).  APGAR: 9, 9; weight pending.   Placenta status: spontaneous, intact.  Cord: 3 vessel with the following complications: none.  Cord pH: not obtained  Anesthesia:  None Episiotomy: None Lacerations:  1st degree Suture Repair: 3.0 vicryl Est. Blood Loss (mL):  150  Mom to postpartum.  Baby to Couplet care / Skin to Skin.  Jinny BlossomKaty D Mayo 08/09/2016, 6:39 AM

## 2015-11-04 ENCOUNTER — Encounter: Payer: Self-pay | Admitting: Family Medicine

## 2015-11-07 ENCOUNTER — Telehealth: Payer: Self-pay | Admitting: Family Medicine

## 2015-11-07 ENCOUNTER — Ambulatory Visit (INDEPENDENT_AMBULATORY_CARE_PROVIDER_SITE_OTHER): Payer: Managed Care, Other (non HMO) | Admitting: Family Medicine

## 2015-11-07 ENCOUNTER — Encounter: Payer: Self-pay | Admitting: Family Medicine

## 2015-11-07 VITALS — BP 102/60 | HR 82 | Temp 97.7°F | Ht 63.0 in | Wt 122.0 lb

## 2015-11-07 DIAGNOSIS — J019 Acute sinusitis, unspecified: Secondary | ICD-10-CM | POA: Insufficient documentation

## 2015-11-07 DIAGNOSIS — J0100 Acute maxillary sinusitis, unspecified: Secondary | ICD-10-CM

## 2015-11-07 MED ORDER — AMOXICILLIN 500 MG PO CAPS: 500.0000 mg | ORAL_CAPSULE | Freq: Three times a day (TID) | ORAL | Status: DC

## 2015-11-07 NOTE — Telephone Encounter (Signed)
[facility] - Client TELEPHONE ADVICE RECORD [facility] Patient Name: Alyssa Gutierrez DOB: 08/01/1986 Initial Comment Caller states rx for amoxicillin was not called in, was seen at 4pm in office, dx sinus infection. Nurse Assessment Nurse: Izola PriceMyers, RN, Cala BradfordKimberly Date/Time (Eastern Time): 11/07/2015 5:49:36 PM Please select the assessment type ---RX called in but not at pharm Additional Documentation ---Caller states rx for amoxicillin was not called in, was seen at 4pm in office, dx sinus infection. Document the name of the medication. ---amoxicillin Has the office closed within the last 30 minutes? ---Yes Were you able to reach someone on the backline? ---No Does the client directives allow for assistance with medications after hours? ---Yes Is there an on-call physician for the client? ---Yes Additional Documentation ---No need for call as pt states that pharmacy made a mistake and medication is there. No further action needed. Guidelines Guideline Title Affirmed Question Affirmed Notes Final Disposition User Clinical Call Izola PriceMyers, RN Cala BradfordKimberly

## 2015-11-07 NOTE — Progress Notes (Signed)
Subjective:    Patient ID: Alyssa Gutierrez, female    DOB: Jan 11, 1986, 30 y.o.   MRN: [id number]  HPI Here for uri symptoms for 4 wk   Face and teeth are hurting  Worse on the R side  Yellow nasal d/c No otc meds   Breast feeding currently   Has used some nasal saline  Low grade temp on and off   No cough  Patient Active Problem List   Diagnosis Date Noted  . Wrist pain, right 03/14/2015  . Right ankle pain 03/14/2015  . Nausea 03/14/2015  . IBS (irritable bowel syndrome) 09/24/2014  . Migraine headache 03/19/2013   Past Medical History  Diagnosis Date  . History of chicken pox     age 79  . History of headache   . History of allergy   . Family history of heart murmur     at birth  . History of migraine   . IBS (irritable bowel syndrome)    Past Surgical History  Procedure Laterality Date  . Wisdom tooth extraction      age 63  . Root canal      age 8   Social History  Substance Use Topics  . Smoking status: Never Smoker   . Smokeless tobacco: Never Used  . Alcohol Use: No   Family History  Problem Relation Age of Onset  . Diabetes Paternal Grandmother   . Hypertension Father   . Heart murmur Brother   . Colon cancer Neg Hx   . Hypertension Paternal Uncle   . Crohn's disease Maternal Grandmother    No Known Allergies Current Outpatient Prescriptions on File Prior to Visit  Medication Sig Dispense Refill  . Prenatal Vit-Fe Fumarate-FA (PRENATAL MULTIVITAMIN) TABS tablet Take 1 tablet by mouth daily at 12 noon. Reported on 11/07/2015    . promethazine (PHENERGAN) 25 MG tablet Take 0.5-1 tablets (12.5-25 mg total) by mouth every 6 (six) hours as needed for nausea. (Patient not taking: Reported on 11/07/2015) 30 tablet 0   No current facility-administered medications on file prior to visit.     Review of Systems Review of Systems  Constitutional: Negative for fever, appetite change, and unexpected weight change.  Eyes: Negative for pain and visual  disturbance.  ENT pos for cong and rhinorrhea and ST and sinus pain  Respiratory: Negative for cough and shortness of breath.   Cardiovascular: Negative for cp or palpitations    Gastrointestinal: Negative for nausea, diarrhea and constipation.  Genitourinary: Negative for urgency and frequency.  Skin: Negative for pallor or rash   Neurological: Negative for weakness, light-headedness, numbness and headaches.  Hematological: Negative for adenopathy. Does not bruise/bleed easily.  Psychiatric/Behavioral: Negative for dysphoric mood. The patient is not nervous/anxious.         Objective:   Physical Exam  Constitutional: She appears well-developed and well-nourished. No distress.  HENT:  Head: Normocephalic and atraumatic.  Right Ear: External ear normal.  Left Ear: External ear normal.  Mouth/Throat: Oropharynx is clear and moist. No oropharyngeal exudate.  Nares are injected and congested  Bilateral maxillary sinus tenderness  Post nasal drip   Eyes: Conjunctivae and EOM are normal. Pupils are equal, round, and reactive to light. Right eye exhibits no discharge. Left eye exhibits no discharge.  Neck: Normal range of motion. Neck supple.  Cardiovascular: Normal rate and regular rhythm.   Pulmonary/Chest: Effort normal and breath sounds normal. No respiratory distress. She has no wheezes. She has no rales.  Lymphadenopathy:    She has no cervical adenopathy.  Neurological: She is alert. No cranial nerve deficit.  Skin: Skin is warm and dry. No rash noted.  Psychiatric: She has a normal mood and affect.          Assessment & Plan:   Problem List Items Addressed This Visit      Respiratory   Acute sinusitis - Primary    In breastfeeding female S/p uri Disc symptomatic care - see instructions on AVS Cover with amoxicillin  Update if not starting to improve in a week or if worsening        Relevant Medications   amoxicillin (AMOXIL) 500 MG capsule

## 2015-11-07 NOTE — Patient Instructions (Signed)
Drink lots of fluids Take amoxicillin as directed Breathe steam Use warm compresses on your face  Use nasal saline or rinse or netti pot for congestion  Update if not starting to improve in a week or if worsening

## 2015-11-07 NOTE — Progress Notes (Signed)
Pre visit review using our clinic review tool, if applicable. No additional management support is needed unless otherwise documented below in the visit note. 

## 2015-11-09 NOTE — Assessment & Plan Note (Signed)
In breastfeeding female S/p uri Disc symptomatic care - see instructions on AVS Cover with amoxicillin  Update if not starting to improve in a week or if worsening

## 2015-11-10 NOTE — Telephone Encounter (Signed)
Per note pharmacy made a mistake and medication was at pharmacy

## 2015-11-10 NOTE — Telephone Encounter (Signed)
It was sent to [facility]- did it not get there? -- please call/send it in again

## 2015-12-30 ENCOUNTER — Ambulatory Visit (INDEPENDENT_AMBULATORY_CARE_PROVIDER_SITE_OTHER): Payer: Managed Care, Other (non HMO) | Admitting: Obstetrics & Gynecology

## 2015-12-30 ENCOUNTER — Encounter: Payer: Self-pay | Admitting: Obstetrics & Gynecology

## 2015-12-30 VITALS — BP 110/72 | HR 85 | Wt 127.0 lb

## 2015-12-30 DIAGNOSIS — Z36 Encounter for antenatal screening of mother: Secondary | ICD-10-CM | POA: Diagnosis not present

## 2015-12-30 DIAGNOSIS — Z3481 Encounter for supervision of other normal pregnancy, first trimester: Secondary | ICD-10-CM

## 2015-12-30 DIAGNOSIS — Z113 Encounter for screening for infections with a predominantly sexual mode of transmission: Secondary | ICD-10-CM

## 2015-12-30 DIAGNOSIS — Z3491 Encounter for supervision of normal pregnancy, unspecified, first trimester: Secondary | ICD-10-CM

## 2015-12-30 DIAGNOSIS — Z124 Encounter for screening for malignant neoplasm of cervix: Secondary | ICD-10-CM | POA: Diagnosis not present

## 2015-12-30 DIAGNOSIS — Z3490 Encounter for supervision of normal pregnancy, unspecified, unspecified trimester: Secondary | ICD-10-CM | POA: Insufficient documentation

## 2015-12-30 NOTE — Progress Notes (Signed)
Bedside US shows single IUP measuring [redacted]w[redacted]d CRL and 162FHR

## 2015-12-30 NOTE — Progress Notes (Signed)
Subjective:    Alyssa Gutierrez is a 30 y.o. G3P1011at [redacted]w[redacted]d by LMP being seen today for her first obstetrical visit.  Her obstetrical history is significant for term SVD, recent SAB. Patient does intend to breast feed. Pregnancy history fully reviewed.  Patient reports no complaints.  Filed Vitals:   12/30/15 1521  BP: 110/72  Pulse: 85  Weight: 127 lb (57.607 kg)    HISTORY: OB History  Gravida Para Term Preterm AB SAB TAB Ectopic Multiple Living  0 1 1 0 0 0 1    # Outcome Date GA Lbr Len/2nd Weight Sex Delivery Anes PTL Lv  3 Current           2 SAB 10/07/15 [redacted]w[redacted]d    SAB     1 Term 08/07/14 [redacted]w[redacted]d 06:04 / 01:25 8 lb 10.8 oz (3.935 kg) M Vag-Spont Local,Pud  Y     Past Medical History  Diagnosis Date  . History of chicken pox     age 71  . History of headache   . History of allergy   . Family history of heart murmur     at birth  . History of migraine   . IBS (irritable bowel syndrome)    Past Surgical History  Procedure Laterality Date  . Wisdom tooth extraction      age 55  . Root canal      age 29   Family History  Problem Relation Age of Onset  . Diabetes Paternal Grandmother   . Hypertension Father   . Heart murmur Brother   . Colon cancer Neg Hx   . Hypertension Paternal Uncle   . Crohn's disease Maternal Grandmother      Exam    Uterus:     Pelvic Exam:    Perineum: No Hemorrhoids, Normal Perineum   Vulva: normal   Vagina:  normal mucosa, normal discharge   Cervix: multiparous appearance, no bleeding following Pap, no cervical motion tenderness and no lesions   Adnexa: normal adnexa and no mass, fullness, tenderness   Bony Pelvis: average  System: Breast:  normal appearance, no masses or tenderness   Skin: normal coloration and turgor, no rashes   Neurologic: oriented, normal, negative   Extremities: normal strength, tone, and muscle mass   HEENT PERRLA, extra ocular movement intact and sclera clear, anicteric   Mouth/Teeth  mucous membranes moist, pharynx normal without lesions and dental hygiene good   Neck supple and no masses   Cardiovascular: regular rate and rhythm   Respiratory:  appears well, vitals normal, no respiratory distress, acyanotic, normal RR, chest clear, no wheezing, crepitations, rhonchi, normal symmetric air entry   Abdomen: soft, non-tender; bowel sounds normal; no masses,  no organomegaly   Urinary: urethral meatus normal   Clinic u/s showed SIUP, +FH, CRL ~ [redacted]w[redacted]d   Assessment:    Pregnancy: G3P1011 Patient Active Problem List   Diagnosis Date Noted  . Supervision of normal pregnancy 12/30/2015  . Acute sinusitis 11/07/2015  . Wrist pain, right 03/14/2015  . Right ankle pain 03/14/2015  . Nausea 03/14/2015  . IBS (irritable bowel syndrome) 09/24/2014  . Migraine headache 03/19/2013     Plan:   Initial labs drawn. Prenatal vitamins. Problem list reviewed and updated. Genetic Screening discussed: wants [employer]. Will be done at next visit. She is a [employer] candidate, and she wants to enroll in this program. Routine obstetric precautions reviewed. Follow up in 4 weeks.  Tereso Newcomer, MD  12/30/2015   

## 2015-12-30 NOTE — Patient Instructions (Addendum)
 First Trimester of Pregnancy The first trimester of pregnancy is from week 1 until the end of week 12 (months 1 through 3). A week after a sperm fertilizes an egg, the egg will implant on the wall of the uterus. This embryo will begin to develop into a baby. Genes from you and your partner are forming the baby. The female genes determine whether the baby is a boy or a girl. At 6-8 weeks, the eyes and face are formed, and the heartbeat can be seen on ultrasound. At the end of 12 weeks, all the baby's organs are formed.  Now that you are pregnant, you will want to do everything you can to have a healthy baby. Two of the most important things are to get good prenatal care and to follow your health care provider's instructions. Prenatal care is all the medical care you receive before the baby's birth. This care will help prevent, find, and treat any problems during the pregnancy and childbirth. BODY CHANGES Your body goes through many changes during pregnancy. The changes vary from woman to woman.   You may gain or lose a couple of pounds at first.  You may feel sick to your stomach (nauseous) and throw up (vomit). If the vomiting is uncontrollable, call your health care provider.  You may tire easily.  You may develop headaches that can be relieved by medicines approved by your health care provider.  You may urinate more often. Painful urination may mean you have a bladder infection.  You may develop heartburn as a result of your pregnancy.  You may develop constipation because certain hormones are causing the muscles that push waste through your intestines to slow down.  You may develop hemorrhoids or swollen, bulging veins (varicose veins).  Your breasts may begin to grow larger and become tender. Your nipples may stick out more, and the tissue that surrounds them (areola) may become darker.  Your gums may bleed and may be sensitive to brushing and flossing.  Dark spots or blotches  (chloasma, mask of pregnancy) may develop on your face. This will likely fade after the baby is born.  Your menstrual periods will stop.  You may have a loss of appetite.  You may develop cravings for certain kinds of food.  You may have changes in your emotions from day to day, such as being excited to be pregnant or being concerned that something may go wrong with the pregnancy and baby.  You may have more vivid and strange dreams.  You may have changes in your hair. These can include thickening of your hair, rapid growth, and changes in texture. Some women also have hair loss during or after pregnancy, or hair that feels dry or thin. Your hair will most likely return to normal after your baby is born. WHAT TO EXPECT AT YOUR PRENATAL VISITS During a routine prenatal visit:  You will be weighed to make sure you and the baby are growing normally.  Your blood pressure will be taken.  Your abdomen will be measured to track your baby's growth.  The fetal heartbeat will be listened to starting around week 10 or 12 of your pregnancy.  Test results from any previous visits will be discussed. Your health care provider may ask you:  How you are feeling.  If you are feeling the baby move.  If you have had any abnormal symptoms, such as leaking fluid, bleeding, severe headaches, or abdominal cramping.  If you are using any tobacco   products, including cigarettes, chewing tobacco, and electronic cigarettes.  If you have any questions. Other tests that may be performed during your first trimester include:  Blood tests to find your blood type and to check for the presence of any previous infections. They will also be used to check for low iron levels (anemia) and Rh antibodies. Later in the pregnancy, blood tests for diabetes will be done along with other tests if problems develop.  Urine tests to check for infections, diabetes, or protein in the urine.  An ultrasound to confirm the  proper growth and development of the baby.  An amniocentesis to check for possible genetic problems.  Fetal screens for spina bifida and Down syndrome.  You may need other tests to make sure you and the baby are doing well.  HIV (human immunodeficiency virus) testing. Routine prenatal testing includes screening for HIV, unless you choose not to have this test. HOME CARE INSTRUCTIONS  Medicines  Follow your health care provider's instructions regarding medicine use. Specific medicines may be either safe or unsafe to take during pregnancy.  Take your prenatal vitamins as directed.  If you develop constipation, try taking a stool softener if your health care provider approves. Diet  Eat regular, well-balanced meals. Choose a variety of foods, such as meat or vegetable-based protein, fish, milk and low-fat dairy products, vegetables, fruits, and whole grain breads and cereals. Your health care provider will help you determine the amount of weight gain that is right for you.  Avoid raw meat and uncooked cheese. These carry germs that can cause birth defects in the baby.  Eating four or five small meals rather than three large meals a day may help relieve nausea and vomiting. If you start to feel nauseous, eating a few soda crackers can be helpful. Drinking liquids between meals instead of during meals also seems to help nausea and vomiting.  If you develop constipation, eat more high-fiber foods, such as fresh vegetables or fruit and whole grains. Drink enough fluids to keep your urine clear or pale yellow. Activity and Exercise  Exercise only as directed by your health care provider. Exercising will help you:  Control your weight.  Stay in shape.  Be prepared for labor and delivery.  Experiencing pain or cramping in the lower abdomen or low back is a good sign that you should stop exercising. Check with your health care provider before continuing normal exercises.  Try to avoid  standing for long periods of time. Move your legs often if you must stand in one place for a long time.  Avoid heavy lifting.  Wear low-heeled shoes, and practice good posture.  You may continue to have sex unless your health care provider directs you otherwise. Relief of Pain or Discomfort  Wear a good support bra for breast tenderness.   Take warm sitz baths to soothe any pain or discomfort caused by hemorrhoids. Use hemorrhoid cream if your health care provider approves.   Rest with your legs elevated if you have leg cramps or low back pain.  If you develop varicose veins in your legs, wear support hose. Elevate your feet for 15 minutes, 3-4 times a day. Limit salt in your diet. Prenatal Care  Schedule your prenatal visits by the twelfth week of pregnancy. They are usually scheduled monthly at first, then more often in the last 2 months before delivery.  Write down your questions. Take them to your prenatal visits.  Keep all your prenatal visits as directed by   your health care provider. Safety  Wear your seat belt at all times when driving.  Make a list of emergency phone numbers, including numbers for family, friends, the hospital, and police and fire departments. General Tips  Ask your health care provider for a referral to a local prenatal education class. Begin classes no later than at the beginning of month 6 of your pregnancy.  Ask for help if you have counseling or nutritional needs during pregnancy. Your health care provider can offer advice or refer you to specialists for help with various needs.  Do not use hot tubs, steam rooms, or saunas.  Do not douche or use tampons or scented sanitary pads.  Do not cross your legs for long periods of time.  Avoid cat litter boxes and soil used by cats. These carry germs that can cause birth defects in the baby and possibly loss of the fetus by miscarriage or stillbirth.  Avoid all smoking, herbs, alcohol, and medicines  not prescribed by your health care provider. Chemicals in these affect the formation and growth of the baby.  Do not use any tobacco products, including cigarettes, chewing tobacco, and electronic cigarettes. If you need help quitting, ask your health care provider. You may receive counseling support and other resources to help you quit.  Schedule a dentist appointment. At home, brush your teeth with a soft toothbrush and be gentle when you floss. SEEK MEDICAL CARE IF:   You have dizziness.  You have mild pelvic cramps, pelvic pressure, or nagging pain in the abdominal area.  You have persistent nausea, vomiting, or diarrhea.  You have a bad smelling vaginal discharge.  You have pain with urination.  You notice increased swelling in your face, hands, legs, or ankles. SEEK IMMEDIATE MEDICAL CARE IF:   You have a fever.  You are leaking fluid from your vagina.  You have spotting or bleeding from your vagina.  You have severe abdominal cramping or pain.  You have rapid weight gain or loss.  You vomit blood or material that looks like coffee grounds.  You are exposed to German measles and have never had them.  You are exposed to fifth disease or chickenpox.  You develop a severe headache.  You have shortness of breath.  You have any kind of trauma, such as from a fall or a car accident.   This information is not intended to replace advice given to you by your health care provider. Make sure you discuss any questions you have with your health care provider.   Document Released: 10/12/2001 Document Revised: 11/08/2014 Document Reviewed: 08/28/2013 Elsevier Interactive Patient Education 2016 [employer]   Breastfeeding Deciding to breastfeed is one of the best choices you can make for you and your baby. A change in hormones during pregnancy causes your breast tissue to grow and increases the number and size of your milk ducts. These hormones also allow proteins, sugars,  and fats from your blood supply to make breast milk in your milk-producing glands. Hormones prevent breast milk from being released before your baby is born as well as prompt milk flow after birth. Once breastfeeding has begun, thoughts of your baby, as well as his or her sucking or crying, can stimulate the release of milk from your milk-producing glands.  BENEFITS OF BREASTFEEDING For Your Baby  Your first milk (colostrum) helps your baby's digestive system function better.  There are antibodies in your milk that help your baby fight off infections.  Your baby has   a lower incidence of asthma, allergies, and sudden infant death syndrome.  The nutrients in breast milk are better for your baby than infant formulas and are designed uniquely for your baby's needs.  Breast milk improves your baby's brain development.  Your baby is less likely to develop other conditions, such as childhood obesity, asthma, or type 2 diabetes mellitus. For You  Breastfeeding helps to create a very special bond between you and your baby.  Breastfeeding is convenient. Breast milk is always available at the correct temperature and costs nothing.  Breastfeeding helps to burn calories and helps you lose the weight gained during pregnancy.  Breastfeeding makes your uterus contract to its prepregnancy size faster and slows bleeding (lochia) after you give birth.   Breastfeeding helps to lower your risk of developing type 2 diabetes mellitus, osteoporosis, and breast or ovarian cancer later in life. SIGNS THAT YOUR BABY IS HUNGRY Early Signs of Hunger  Increased alertness or activity.  Stretching.  Movement of the head from side to side.  Movement of the head and opening of the mouth when the corner of the mouth or cheek is stroked (rooting).  Increased sucking sounds, smacking lips, cooing, sighing, or squeaking.  Hand-to-mouth movements.  Increased sucking of fingers or hands. Late Signs of  Hunger  Fussing.  Intermittent crying. Extreme Signs of Hunger Signs of extreme hunger will require calming and consoling before your baby will be able to breastfeed successfully. Do not wait for the following signs of extreme hunger to occur before you initiate breastfeeding:  Restlessness.  A loud, strong cry.  Screaming. BREASTFEEDING BASICS Breastfeeding Initiation  Find a comfortable place to sit or lie down, with your neck and back well supported.  Place a pillow or rolled up blanket under your baby to bring him or her to the level of your breast (if you are seated). Nursing pillows are specially designed to help support your arms and your baby while you breastfeed.  Make sure that your baby's abdomen is facing your abdomen.  Gently massage your breast. With your fingertips, massage from your chest wall toward your nipple in a circular motion. This encourages milk flow. You may need to continue this action during the feeding if your milk flows slowly.  Support your breast with 4 fingers underneath and your thumb above your nipple. Make sure your fingers are well away from your nipple and your baby's mouth.  Stroke your baby's lips gently with your finger or nipple.  When your baby's mouth is open wide enough, quickly bring your baby to your breast, placing your entire nipple and as much of the colored area around your nipple (areola) as possible into your baby's mouth.  More areola should be visible above your baby's upper lip than below the lower lip.  Your baby's tongue should be between his or her lower gum and your breast.  Ensure that your baby's mouth is correctly positioned around your nipple (latched). Your baby's lips should create a seal on your breast and be turned out (everted).  It is common for your baby to suck about 2-3 minutes in order to start the flow of breast milk. Latching Teaching your baby how to latch on to your breast properly is very important.  An improper latch can cause nipple pain and decreased milk supply for you and poor weight gain in your baby. Also, if your baby is not latched onto your nipple properly, he or she may swallow some air during feeding. This   can make your baby fussy. Burping your baby when you switch breasts during the feeding can help to get rid of the air. However, teaching your baby to latch on properly is still the best way to prevent fussiness from swallowing air while breastfeeding. Signs that your baby has successfully latched on to your nipple:  Silent tugging or silent sucking, without causing you pain.  Swallowing heard between every 3-4 sucks.  Muscle movement above and in front of his or her ears while sucking. Signs that your baby has not successfully latched on to nipple:  Sucking sounds or smacking sounds from your baby while breastfeeding.  Nipple pain. If you think your baby has not latched on correctly, slip your finger into the corner of your baby's mouth to break the suction and place it between your baby's gums. Attempt breastfeeding initiation again. Signs of Successful Breastfeeding Signs from your baby:  A gradual decrease in the number of sucks or complete cessation of sucking.  Falling asleep.  Relaxation of his or her body.  Retention of a small amount of milk in his or her mouth.  Letting go of your breast by himself or herself. Signs from you:  Breasts that have increased in firmness, weight, and size 1-3 hours after feeding.  Breasts that are softer immediately after breastfeeding.  Increased milk volume, as well as a change in milk consistency and color by the fifth day of breastfeeding.  Nipples that are not sore, cracked, or bleeding. Signs That Your Baby is Getting Enough Milk  Wetting at least 3 diapers in a 24-hour period. The urine should be clear and pale yellow by age 5 days.  At least 3 stools in a 24-hour period by age 5 days. The stool should be soft and  yellow.  At least 3 stools in a 24-hour period by age 7 days. The stool should be seedy and yellow.  No loss of weight greater than 10% of birth weight during the first 3 days of age.  Average weight gain of 4-7 ounces (113-198 g) per week after age 4 days.  Consistent daily weight gain by age 5 days, without weight loss after the age of 2 weeks. After a feeding, your baby may spit up a small amount. This is common. BREASTFEEDING FREQUENCY AND DURATION Frequent feeding will help you make more milk and can prevent sore nipples and breast engorgement. Breastfeed when you feel the need to reduce the fullness of your breasts or when your baby shows signs of hunger. This is called "breastfeeding on demand." Avoid introducing a pacifier to your baby while you are working to establish breastfeeding (the first 4-6 weeks after your baby is born). After this time you may choose to use a pacifier. Research has shown that pacifier use during the first year of a baby's life decreases the risk of sudden infant death syndrome (SIDS). Allow your baby to feed on each breast as long as he or she wants. Breastfeed until your baby is finished feeding. When your baby unlatches or falls asleep while feeding from the first breast, offer the second breast. Because newborns are often sleepy in the first few weeks of life, you may need to awaken your baby to get him or her to feed. Breastfeeding times will vary from baby to baby. However, the following rules can serve as a guide to help you ensure that your baby is properly fed:  Newborns (babies 4 weeks of age or younger) may breastfeed every 1-3 hours.    Newborns should not go longer than 3 hours during the day or 5 hours during the night without breastfeeding.  You should breastfeed your baby a minimum of 8 times in a 24-hour period until you begin to introduce solid foods to your baby at around 6 months of age. BREAST MILK PUMPING Pumping and storing breast milk  allows you to ensure that your baby is exclusively fed your breast milk, even at times when you are unable to breastfeed. This is especially important if you are going back to work while you are still breastfeeding or when you are not able to be present during feedings. Your lactation consultant can give you guidelines on how long it is safe to store breast milk. A breast pump is a machine that allows you to pump milk from your breast into a sterile bottle. The pumped breast milk can then be stored in a refrigerator or freezer. Some breast pumps are operated by hand, while others use electricity. Ask your lactation consultant which type will work best for you. Breast pumps can be purchased, but some hospitals and breastfeeding support groups lease breast pumps on a monthly basis. A lactation consultant can teach you how to hand express breast milk, if you prefer not to use a pump. CARING FOR YOUR BREASTS WHILE YOU BREASTFEED Nipples can become dry, cracked, and sore while breastfeeding. The following recommendations can help keep your breasts moisturized and healthy:  Avoid using soap on your nipples.  Wear a supportive bra. Although not required, special nursing bras and tank tops are designed to allow access to your breasts for breastfeeding without taking off your entire bra or top. Avoid wearing underwire-style bras or extremely tight bras.  Air dry your nipples for 3-4minutes after each feeding.  Use only cotton bra pads to absorb leaked breast milk. Leaking of breast milk between feedings is normal.  Use lanolin on your nipples after breastfeeding. Lanolin helps to maintain your skin's normal moisture barrier. If you use pure lanolin, you do not need to wash it off before feeding your baby again. Pure lanolin is not toxic to your baby. You may also hand express a few drops of breast milk and gently massage that milk into your nipples and allow the milk to air dry. In the first few weeks after  giving birth, some women experience extremely full breasts (engorgement). Engorgement can make your breasts feel heavy, warm, and tender to the touch. Engorgement peaks within 3-5 days after you give birth. The following recommendations can help ease engorgement:  Completely empty your breasts while breastfeeding or pumping. You may want to start by applying warm, moist heat (in the shower or with warm water-soaked hand towels) just before feeding or pumping. This increases circulation and helps the milk flow. If your baby does not completely empty your breasts while breastfeeding, pump any extra milk after he or she is finished.  Wear a snug bra (nursing or regular) or tank top for 1-2 days to signal your body to slightly decrease milk production.  Apply ice packs to your breasts, unless this is too uncomfortable for you.  Make sure that your baby is latched on and positioned properly while breastfeeding. If engorgement persists after 48 hours of following these recommendations, contact your health care provider or a lactation consultant. OVERALL HEALTH CARE RECOMMENDATIONS WHILE BREASTFEEDING  Eat healthy foods. Alternate between meals and snacks, eating 3 of each per day. Because what you eat affects your breast milk, some of the foods   may make your baby more irritable than usual. Avoid eating these foods if you are sure that they are negatively affecting your baby.  Drink milk, fruit juice, and water to satisfy your thirst (about 10 glasses a day).  Rest often, relax, and continue to take your prenatal vitamins to prevent fatigue, stress, and anemia.  Continue breast self-awareness checks.  Avoid chewing and smoking tobacco. Chemicals from cigarettes that pass into breast milk and exposure to secondhand smoke may harm your baby.  Avoid alcohol and drug use, including marijuana. Some medicines that may be harmful to your baby can pass through breast milk. It is important to ask your health  care provider before taking any medicine, including all over-the-counter and prescription medicine as well as vitamin and herbal supplements. It is possible to become pregnant while breastfeeding. If birth control is desired, ask your health care provider about options that will be safe for your baby. SEEK MEDICAL CARE IF:  You feel like you want to stop breastfeeding or have become frustrated with breastfeeding.  You have painful breasts or nipples.  Your nipples are cracked or bleeding.  Your breasts are red, tender, or warm.  You have a swollen area on either breast.  You have a fever or chills.  You have nausea or vomiting.  You have drainage other than breast milk from your nipples.  Your breasts do not become full before feedings by the fifth day after you give birth.  You feel sad and depressed.  Your baby is too sleepy to eat well.  Your baby is having trouble sleeping.   Your baby is wetting less than 3 diapers in a 24-hour period.  Your baby has less than 3 stools in a 24-hour period.  Your baby's skin or the white part of his or her eyes becomes yellow.   Your baby is not gaining weight by 5 days of age. SEEK IMMEDIATE MEDICAL CARE IF:  Your baby is overly tired (lethargic) and does not want to wake up and feed.  Your baby develops an unexplained fever.   This information is not intended to replace advice given to you by your health care provider. Make sure you discuss any questions you have with your health care provider.   Document Released: 10/18/2005 Document Revised: 07/09/2015 Document Reviewed: 04/11/2013 Elsevier Interactive Patient Education 2016 [employer]  

## 2015-12-31 LAB — PRENATAL PROFILE (SOLSTAS)
Antibody Screen: NEGATIVE
BASOS ABS: 0 10*3/uL (ref 0.0–0.1)
BASOS PCT: 0 % (ref 0–1)
Eosinophils Absolute: 0.1 10*3/uL (ref 0.0–0.7)
Eosinophils Relative: 1 % (ref 0–5)
HEMATOCRIT: 40.6 % (ref 36.0–46.0)
HEMOGLOBIN: 13.3 g/dL (ref 12.0–15.0)
HEP B S AG: NEGATIVE
HIV: NONREACTIVE
Lymphocytes Relative: 28 % (ref 12–46)
Lymphs Abs: 2.9 10*3/uL (ref 0.7–4.0)
MCH: 28.2 pg (ref 26.0–34.0)
MCHC: 32.8 g/dL (ref 30.0–36.0)
MCV: 86.2 fL (ref 78.0–100.0)
MONOS PCT: 8 % (ref 3–12)
MPV: 9.8 fL (ref 8.6–12.4)
Monocytes Absolute: 0.8 10*3/uL (ref 0.1–1.0)
NEUTROS PCT: 63 % (ref 43–77)
Neutro Abs: 6.4 10*3/uL (ref 1.7–7.7)
Platelets: 299 10*3/uL (ref 150–400)
RBC: 4.71 MIL/uL (ref 3.87–5.11)
RDW: 13.6 % (ref 11.5–15.5)
RH TYPE: POSITIVE
Rubella: 0.62 Index (ref <0.90)
WBC: 10.2 10*3/uL (ref 4.0–10.5)

## 2016-01-01 ENCOUNTER — Encounter: Payer: Self-pay | Admitting: Obstetrics & Gynecology

## 2016-01-01 DIAGNOSIS — Z283 Underimmunization status: Secondary | ICD-10-CM | POA: Insufficient documentation

## 2016-01-01 DIAGNOSIS — O9989 Other specified diseases and conditions complicating pregnancy, childbirth and the puerperium: Secondary | ICD-10-CM

## 2016-01-01 DIAGNOSIS — Z2839 Other underimmunization status: Secondary | ICD-10-CM | POA: Insufficient documentation

## 2016-01-01 LAB — CULTURE, OB URINE

## 2016-01-01 LAB — CYTOLOGY - PAP

## 2016-01-15 ENCOUNTER — Other Ambulatory Visit (INDEPENDENT_AMBULATORY_CARE_PROVIDER_SITE_OTHER): Payer: Managed Care, Other (non HMO) | Admitting: *Deleted

## 2016-01-15 VITALS — BP 125/73 | HR 109 | Temp 98.3°F | Resp 16

## 2016-01-15 DIAGNOSIS — R300 Dysuria: Secondary | ICD-10-CM

## 2016-01-15 DIAGNOSIS — A499 Bacterial infection, unspecified: Secondary | ICD-10-CM | POA: Diagnosis not present

## 2016-01-15 DIAGNOSIS — N760 Acute vaginitis: Secondary | ICD-10-CM

## 2016-01-15 DIAGNOSIS — B373 Candidiasis of vulva and vagina: Secondary | ICD-10-CM

## 2016-01-15 DIAGNOSIS — B9689 Other specified bacterial agents as the cause of diseases classified elsewhere: Secondary | ICD-10-CM

## 2016-01-15 DIAGNOSIS — B3731 Acute candidiasis of vulva and vagina: Secondary | ICD-10-CM

## 2016-01-15 NOTE — Progress Notes (Addendum)
SUBJECTIVE:  30 y.o. female complains of vaginal burning with/without urination. Denies abnormal vaginal bleeding or significant pelvic pain or fever. No other UTI symptoms. Denies history of known exposure to STD.  Patient's last menstrual period was 11/03/2015. Currently [id number] pregnant  OBJECTIVE:  She appears well, afebrile. Urine dipstick: WDL  PLAN:  BD Affirm sent to lab. Treatment: will wait for wet prep results Return to clinic prn if symptoms persist or worsen.  Arne ClevelandMandy J Munira Polson, CMA   Attending Attestation Plan was discussed with me and I agree with the documentation above.  Jaynie CollinsUGONNA  ANYANWU, MD, FACOG Attending Obstetrician & Gynecologist, [facility]

## 2016-01-16 LAB — WET PREP BY MOLECULAR PROBE
Candida species: POSITIVE — AB
GARDNERELLA VAGINALIS: POSITIVE — AB
Trichomonas vaginosis: NEGATIVE

## 2016-01-16 MED ORDER — TERCONAZOLE 0.4 % VA CREA
1.0000 | TOPICAL_CREAM | Freq: Every day | VAGINAL | Status: DC
Start: 2016-01-16 — End: 2016-04-01

## 2016-01-16 MED ORDER — METRONIDAZOLE 500 MG PO TABS: 500.0000 mg | ORAL_TABLET | Freq: Two times a day (BID) | ORAL | Status: DC

## 2016-01-16 NOTE — Addendum Note (Signed)
Addended by: Jaynie CollinsANYANWU, Carley Glendenning A on: 01/16/2016 09:57 AM   Modules accepted: Orders

## 2016-01-28 ENCOUNTER — Ambulatory Visit (INDEPENDENT_AMBULATORY_CARE_PROVIDER_SITE_OTHER): Payer: Managed Care, Other (non HMO) | Admitting: Certified Nurse Midwife

## 2016-01-28 VITALS — BP 116/72 | HR 92 | Wt 125.0 lb

## 2016-01-28 DIAGNOSIS — Z3491 Encounter for supervision of normal pregnancy, unspecified, first trimester: Secondary | ICD-10-CM

## 2016-01-28 DIAGNOSIS — Z3481 Encounter for supervision of other normal pregnancy, first trimester: Secondary | ICD-10-CM

## 2016-01-28 NOTE — Patient Instructions (Signed)

## 2016-01-28 NOTE — Progress Notes (Signed)
Subjective:  Alyssa Gutierrez is a 30 y.o. G3P1011 at 2732w2d being seen today for ongoing prenatal care.  She is currently monitored for the following issues for this low-risk pregnancy and has Migraine headache; IBS (irritable bowel syndrome); Supervision of normal pregnancy; and Rubella non-immune status, antepartum on her problem list.  Patient reports no complaints.  Contractions: Not present. Vag. Bleeding: None.  Movement: Absent. Denies leaking of fluid.   The following portions of the patient's history were reviewed and updated as appropriate: allergies, current medications, past family history, past medical history, past social history, past surgical history and problem list. Problem list updated.  Objective:   Filed Vitals:   01/28/16 1544  BP: 116/72  Pulse: 92  Weight: 125 lb (56.7 kg)    Fetal Status:     Movement: Absent     General:  Alert, oriented and cooperative. Patient is in no acute distress.  Skin: Skin is warm and dry. No rash noted.   Cardiovascular: Normal heart rate noted  Respiratory: Normal respiratory effort, no problems with respiration noted  Abdomen: Soft, gravid, appropriate for gestational age. Pain/Pressure: Absent     Pelvic: Vag. Bleeding: None Vag D/C Character: Thin   Cervical exam deferred        Extremities: Normal range of motion.  Edema: None  Mental Status: Normal mood and affect. Normal behavior. Normal judgment and thought content.   Urinalysis: Urine Protein: Negative Urine Glucose: Negative  Assessment and Plan:  Pregnancy: G3P1011 at 3332w2d  1. Supervision of normal pregnancy, first trimester  - US MFM Fetal Nuchal Translucency; Future - Genetic Screening  Preterm labor symptoms and general obstetric precautions including but not limited to vaginal bleeding, contractions, leaking of fluid and fetal movement were reviewed in detail with the patient. Please refer to After Visit Summary for other counseling recommendations.  Return  for schedule anatomy ultrasound.   Rhea PinkLori A Coby Antrobus, CNM

## 2016-02-03 ENCOUNTER — Ambulatory Visit (HOSPITAL_COMMUNITY): Payer: Managed Care, Other (non HMO)

## 2016-03-10 ENCOUNTER — Ambulatory Visit (HOSPITAL_COMMUNITY)
Admission: RE | Admit: 2016-03-10 | Discharge: 2016-03-10 | Disposition: A | Discharge status: Home / Self Care | Payer: Managed Care, Other (non HMO) | Source: Ambulatory Visit | Attending: Certified Nurse Midwife | Admitting: Certified Nurse Midwife

## 2016-03-10 ENCOUNTER — Other Ambulatory Visit: Payer: Self-pay | Admitting: Certified Nurse Midwife

## 2016-03-10 DIAGNOSIS — Z3491 Encounter for supervision of normal pregnancy, unspecified, first trimester: Secondary | ICD-10-CM

## 2016-03-10 DIAGNOSIS — Z3A18 18 weeks gestation of pregnancy: Secondary | ICD-10-CM

## 2016-03-10 DIAGNOSIS — Z1389 Encounter for screening for other disorder: Secondary | ICD-10-CM

## 2016-03-10 DIAGNOSIS — Z36 Encounter for antenatal screening of mother: Secondary | ICD-10-CM | POA: Diagnosis present

## 2016-03-18 IMAGING — US US OB FOLLOW-UP
1 series · 12 of 28 positions shown · non-contrast
Comparison: none

[Series 1: us ob follow-up · 0.15mm/px · 12 of 31 slices shown]
[im 2/31]
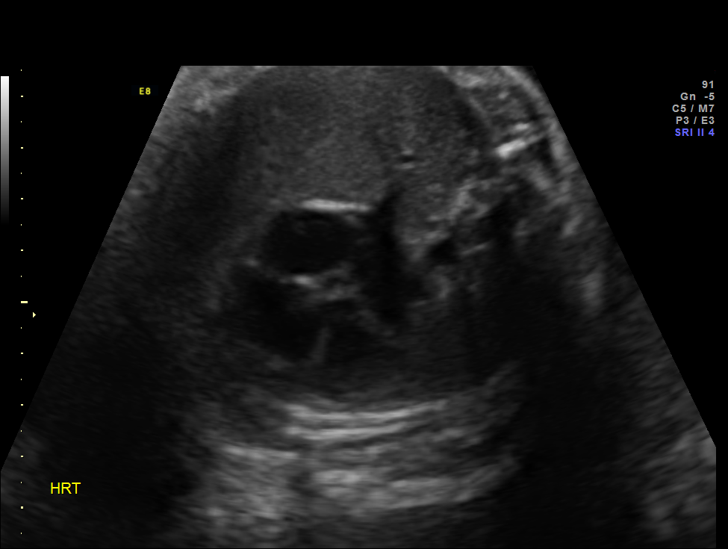
[im 4/31]
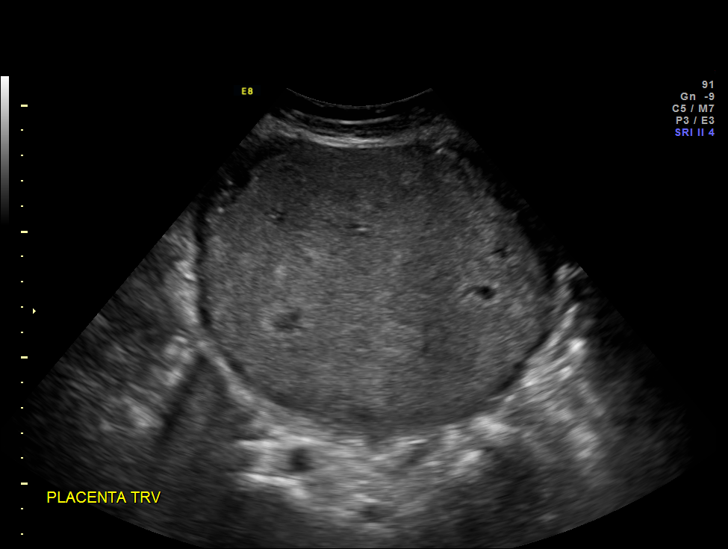
[im 6/31]
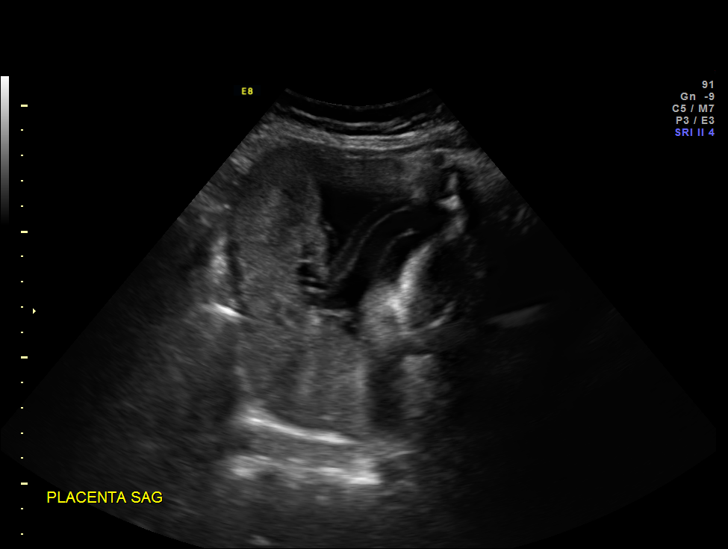
[im 9/31]
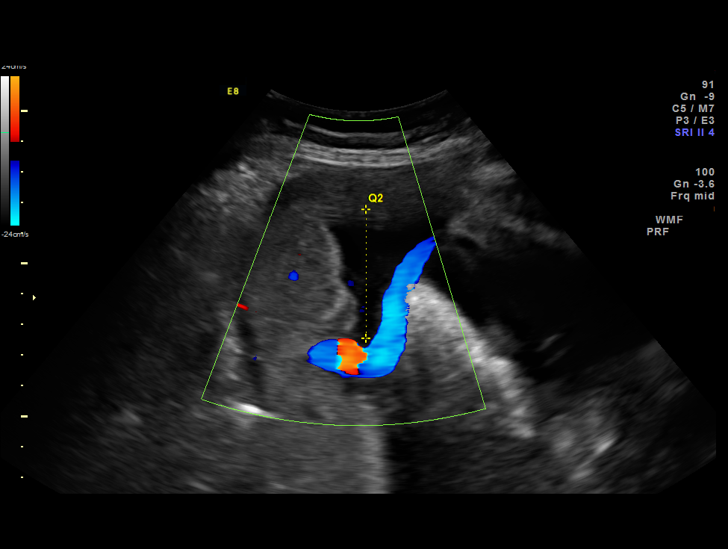
[im 12/31]
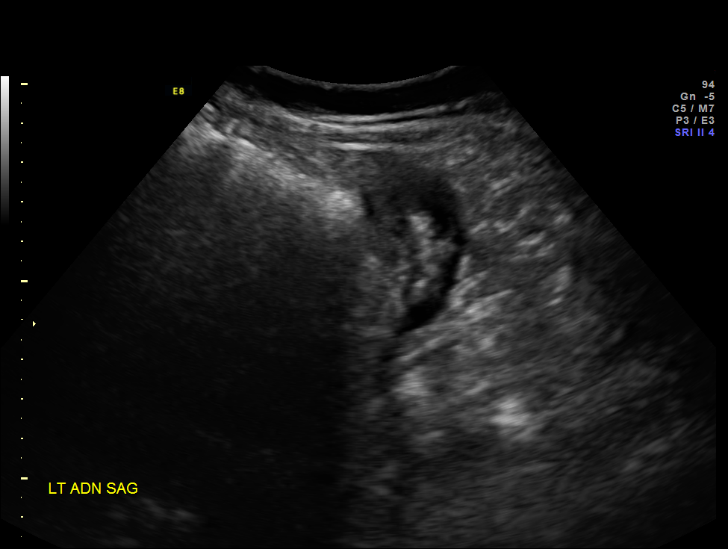
[im 14/31]
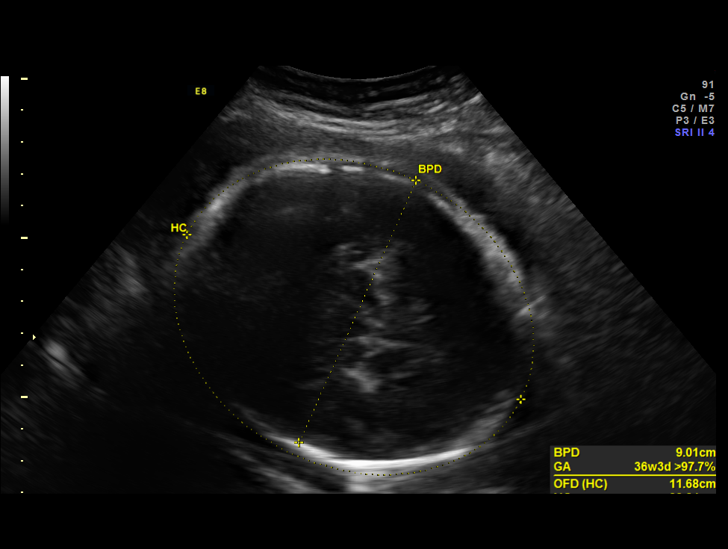
[im 17/31]
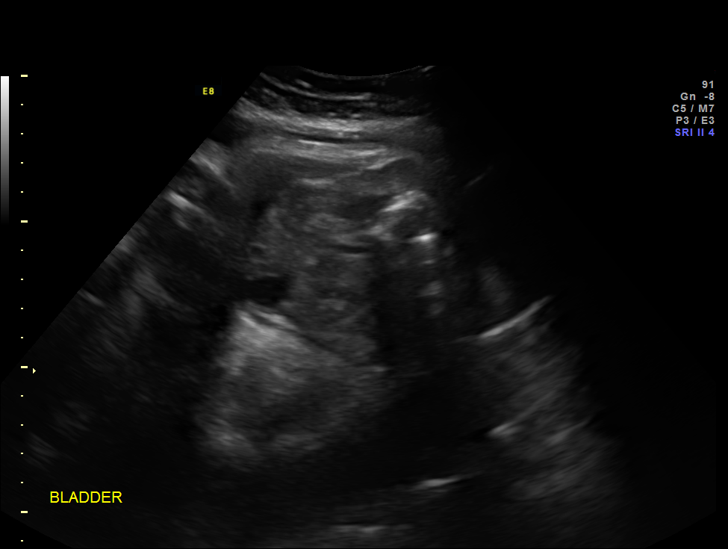
[im 19/31]
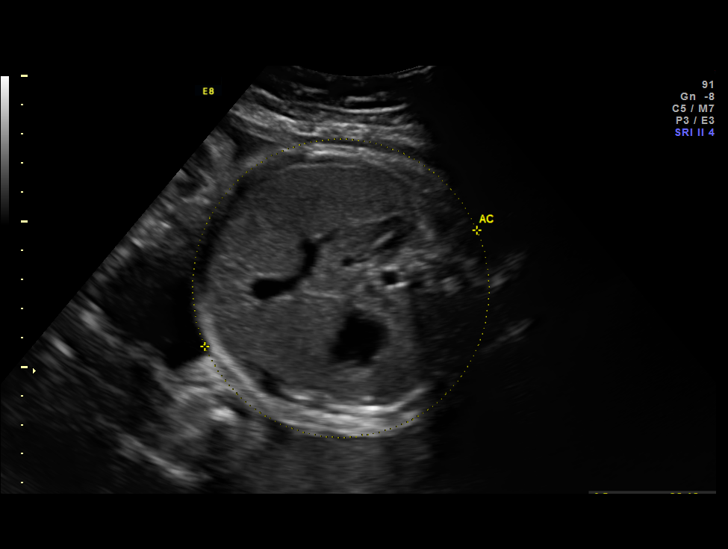
[im 22/31]
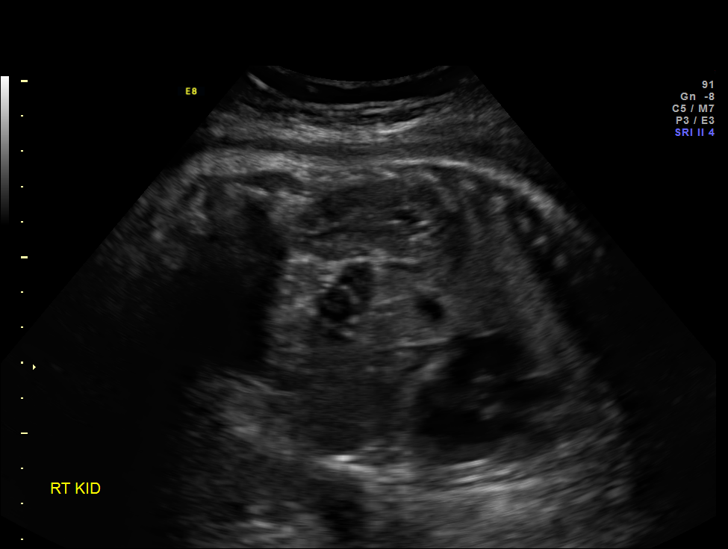
[im 25/31]
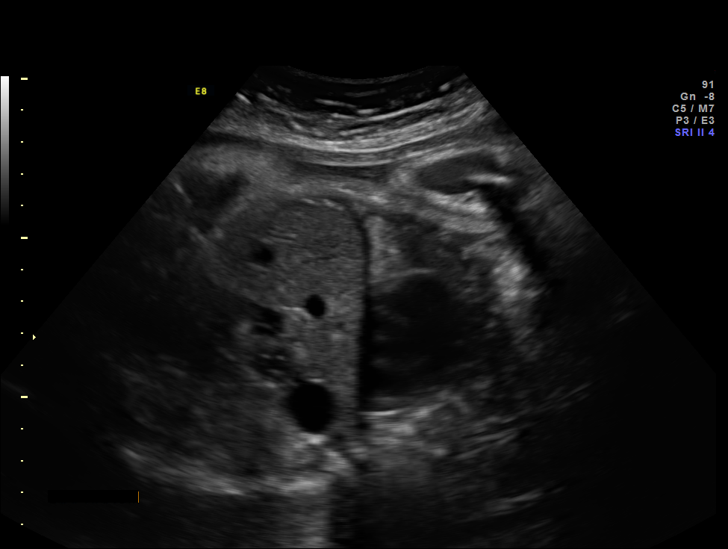
[im 27/31]
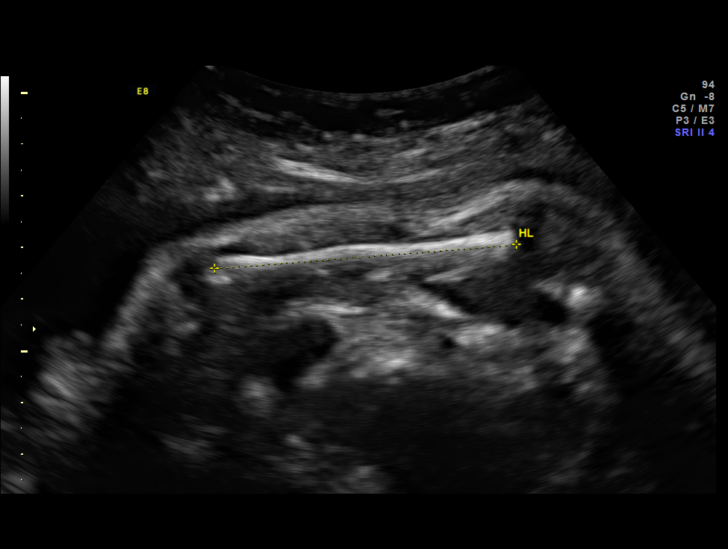
[im 29/31]
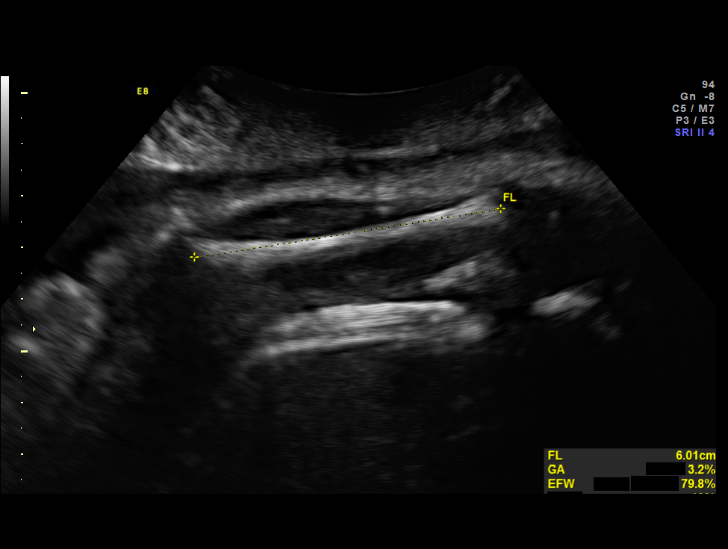

[12 of 28 positions shown; findings below may reference images not displayed]

OBSTETRICS REPORT
                      (Signed Final 06/25/2014 [DATE])

Service(s) Provided

 US OB FOLLOW UP                                       76816.1
Indications

 Echogenic bowel - Low risk NIPS, Negative
 TORCH titers and CF screen
Fetal Evaluation

 Num Of Fetuses:    1
 Fetal Heart Rate:  152                          bpm
 Cardiac Activity:  Observed
 Presentation:      Cephalic
 Placenta:          Posterior Fundal, above
                    cervical os
 P. Cord            Previously Visualized
 Insertion:

 Amniotic Fluid
 AFI FV:      Subjectively within normal limits
 AFI Sum:     11.78   cm       32  %Tile     Larg Pckt:    4.22  cm
 RUQ:   3.44    cm   RLQ:    2.39   cm    LUQ:   4.22    cm   LLQ:    1.73   cm
Biometry

 BPD:     88.8  mm     G. Age:  35w 6d                CI:         76.8   70 - 86
 OFD:    115.7  mm                                    FL/HC:      18.6   19.9 -

 HC:     329.2  mm     G. Age:  37w 3d       96  %    HC/AC:      1.03   0.96 -

 AC:     320.2  mm     G. Age:  35w 6d     > 97  %    FL/BPD:     69.0   71 - 87
 FL:      61.3  mm     G. Age:  31w 6d        8  %    FL/AC:      19.1   20 - 24
 HUM:     58.1  mm     G. Age:  33w 5d       64  %

 Est. FW:    4484  gm    5 lb 11 oz      82  %
Gestational Age

 LMP:           33w 3d        Date:  11/03/13                 EDD:   08/10/14
 U/S Today:     35w 2d                                        EDD:   07/28/14
 Best:          33w 3d     Det. By:  LMP  (11/03/13)          EDD:   08/10/14
Anatomy
 Cranium:          Appears normal         Aortic Arch:      Previously seen
 Fetal Cavum:      Previously seen        Ductal Arch:      Previously seen
 Ventricles:       Previously seen        Diaphragm:        Appears normal
 Choroid Plexus:   Previously seen        Stomach:          Appears normal, left
                                                            sided
 Cerebellum:       Previously seen        Abdomen:          Appears normal
 Posterior Fossa:  Previously seen        Abdominal Wall:   Previously seen
 Nuchal Fold:      Not applicable (>20    Cord Vessels:     Previously seen
                   wks GA)
 Face:             Orbits and profile     Kidneys:          Appear normal
                   previously seen
 Lips:             Previously seen        Bladder:          Appears normal
 Heart:            Appears normal         Spine:            Limited views prev
                   (4CH, axis, and                          seen as NL
                   situs)
 RVOT:             Previously seen        Lower             Previously seen
                                          Extremities:
 LVOT:             Previously seen        Upper             Previously seen
                                          Extremities:

 Other:  Male gender previously seen. Heels and 5Tiger previously seen. Nasal
         bone previously visualized.
Cervix Uterus Adnexa

 Cervix:       Not visualized (advanced GA >36wks)

 Adnexa:     No abnormality visualized.
Impression

 SIUP at 33+3 weeks
 Normal interval anatomy; anatomic survey complete
 Normal amniotic fluid volume
 Appropriate interval growth with EFW at the 82nd %tile; AC >
 97th %tile
Recommendations

 Follow-up as clinically indicated

## 2016-03-23 ENCOUNTER — Encounter: Payer: Managed Care, Other (non HMO) | Admitting: Obstetrics & Gynecology

## 2016-04-01 ENCOUNTER — Ambulatory Visit (INDEPENDENT_AMBULATORY_CARE_PROVIDER_SITE_OTHER): Payer: Managed Care, Other (non HMO) | Admitting: Advanced Practice Midwife

## 2016-04-01 VITALS — BP 121/72 | HR 93 | Wt 138.0 lb

## 2016-04-01 DIAGNOSIS — Z1389 Encounter for screening for other disorder: Secondary | ICD-10-CM

## 2016-04-01 DIAGNOSIS — Z363 Encounter for antenatal screening for malformations: Secondary | ICD-10-CM

## 2016-04-01 DIAGNOSIS — IMO0002 Reserved for concepts with insufficient information to code with codable children: Secondary | ICD-10-CM

## 2016-04-01 DIAGNOSIS — Z3492 Encounter for supervision of normal pregnancy, unspecified, second trimester: Secondary | ICD-10-CM

## 2016-04-01 DIAGNOSIS — Z0489 Encounter for examination and observation for other specified reasons: Secondary | ICD-10-CM

## 2016-04-01 DIAGNOSIS — Z3482 Encounter for supervision of other normal pregnancy, second trimester: Secondary | ICD-10-CM

## 2016-04-01 DIAGNOSIS — Z36 Encounter for antenatal screening of mother: Secondary | ICD-10-CM

## 2016-04-01 NOTE — Patient Instructions (Signed)

## 2016-04-02 ENCOUNTER — Encounter: Payer: Self-pay | Admitting: *Deleted

## 2016-04-03 NOTE — Progress Notes (Signed)
Subjective:  Alyssa Gutierrez is a 30 y.o. G3P1011 at 5671w3d being seen today for ongoing prenatal care.  She is currently monitored for the following issues for this low-risk pregnancy and has Migraine headache; IBS (irritable bowel syndrome); Supervision of normal pregnancy; and Rubella non-immune status, antepartum on her problem list.  Patient reports no complaints.  Contractions: Not present. Vag. Bleeding: None.  Movement: Present. Denies leaking of fluid.   The following portions of the patient's history were reviewed and updated as appropriate: allergies, current medications, past family history, past medical history, past social history, past surgical history and problem list. Problem list updated.  Objective:   Filed Vitals:   04/01/16 1555  BP: 121/72  Pulse: 93  Weight: 138 lb (62.596 kg)    Fetal Status: Fetal Heart Rate (bpm): 150 Fundal Height: 23 cm Movement: Present     General:  Alert, oriented and cooperative. Patient is in no acute distress.  Skin: Skin is warm and dry. No rash noted.   Cardiovascular: Normal heart rate noted  Respiratory: Normal respiratory effort, no problems with respiration noted  Abdomen: Soft, gravid, appropriate for gestational age. Pain/Pressure: Present     Pelvic: Vag. Bleeding: None     Cervical exam deferred        Extremities: Normal range of motion.  Edema: None  Mental Status: Normal mood and affect. Normal behavior. Normal judgment and thought content.   Urinalysis:      Assessment and Plan:  Pregnancy: G3P1011 at 7057w5d  1. Evaluate anatomy not seen on prior sonogram  - US MFM OB FOLLOW UP; Future  2. Encounter for routine screening for malformation using ultrasonics  - US MFM OB FOLLOW UP; Future  3. Supervision of normal pregnancy, second trimester  - Encourage childbirth education, breast-feeding classes  Preterm labor symptoms and general obstetric precautions including but not limited to vaginal bleeding,  contractions, leaking of fluid and fetal movement were reviewed in detail with the patient. Please refer to After Visit Summary for other counseling recommendations.  Return in about 7 weeks (around 05/20/2016) for ROB/GTT.   Dorathy KinsmanVirginia Jazmynn Pho, CNM

## 2016-04-05 ENCOUNTER — Other Ambulatory Visit: Payer: Self-pay | Admitting: Advanced Practice Midwife

## 2016-04-05 ENCOUNTER — Ambulatory Visit (HOSPITAL_COMMUNITY)
Admission: RE | Admit: 2016-04-05 | Discharge: 2016-04-05 | Disposition: A | Discharge status: Home / Self Care | Payer: Managed Care, Other (non HMO) | Source: Ambulatory Visit | Attending: Advanced Practice Midwife | Admitting: Advanced Practice Midwife

## 2016-04-05 DIAGNOSIS — Z3A22 22 weeks gestation of pregnancy: Secondary | ICD-10-CM

## 2016-04-05 DIAGNOSIS — IMO0002 Reserved for concepts with insufficient information to code with codable children: Secondary | ICD-10-CM

## 2016-04-05 DIAGNOSIS — Z0489 Encounter for examination and observation for other specified reasons: Secondary | ICD-10-CM

## 2016-04-05 DIAGNOSIS — Z36 Encounter for antenatal screening of mother: Secondary | ICD-10-CM | POA: Insufficient documentation

## 2016-04-05 DIAGNOSIS — Z1389 Encounter for screening for other disorder: Secondary | ICD-10-CM

## 2016-04-05 DIAGNOSIS — Z3492 Encounter for supervision of normal pregnancy, unspecified, second trimester: Secondary | ICD-10-CM

## 2016-04-22 ENCOUNTER — Inpatient Hospital Stay (HOSPITAL_COMMUNITY)
Admission: EM | Admit: 2016-04-22 | Discharge: 2016-04-22 | Disposition: A | Discharge status: Home / Self Care | Payer: Managed Care, Other (non HMO) | Source: Ambulatory Visit | Attending: Obstetrics & Gynecology | Admitting: Obstetrics & Gynecology

## 2016-04-22 ENCOUNTER — Encounter (HOSPITAL_COMMUNITY): Payer: Self-pay | Admitting: *Deleted

## 2016-04-22 DIAGNOSIS — K589 Irritable bowel syndrome without diarrhea: Secondary | ICD-10-CM | POA: Insufficient documentation

## 2016-04-22 DIAGNOSIS — Z3A24 24 weeks gestation of pregnancy: Secondary | ICD-10-CM | POA: Diagnosis not present

## 2016-04-22 DIAGNOSIS — O368120 Decreased fetal movements, second trimester, not applicable or unspecified: Secondary | ICD-10-CM | POA: Diagnosis present

## 2016-04-22 DIAGNOSIS — O99612 Diseases of the digestive system complicating pregnancy, second trimester: Secondary | ICD-10-CM | POA: Insufficient documentation

## 2016-04-22 DIAGNOSIS — Z3689 Encounter for other specified antenatal screening: Secondary | ICD-10-CM

## 2016-04-22 DIAGNOSIS — O368121 Decreased fetal movements, second trimester, fetus 1: Secondary | ICD-10-CM

## 2016-04-22 NOTE — MAU Provider Note (Signed)
History     CSN: [id number]  Arrival date and time: 04/22/16 1347   First Provider Initiated Contact with Patient 04/22/16 1452      Chief Complaint  Patient presents with  . Decreased Fetal Movement   HPI   Alyssa Gutierrez is a 30 y.o. female 813P1011 @ 7156w3d here in MAU with decreased fetal movements "the baby is not as active today". Since her arrival to MAU she has been feeling the baby move regularly.   She denies vaginal bleeding or leaking of fluid No other pregnancy concerns today.   OB History    Gravida Para Term Preterm AB TAB SAB Ectopic Multiple Living   3 1 1  0 1 0 1 0 0 1      Past Medical History  Diagnosis Date  . History of chicken pox     age 30  . History of headache   . History of allergy   . Family history of heart murmur     at birth  . History of migraine   . IBS (irritable bowel syndrome)   . Nausea 03/14/2015    Around day of ovulation every month      Past Surgical History  Procedure Laterality Date  . Wisdom tooth extraction      age 30  . Root canal      age 30    Family History  Problem Relation Age of Onset  . Diabetes Paternal Grandmother   . Hypertension Father   . Heart murmur Brother   . Colon cancer Neg Hx   . Hypertension Paternal Uncle   . Crohn's disease Maternal Grandmother     Social History  Substance Use Topics  . Smoking status: Never Smoker   . Smokeless tobacco: Never Used  . Alcohol Use: No    Allergies: No Known Allergies  Prescriptions prior to admission  Medication Sig Dispense Refill Last Dose  . acetaminophen (TYLENOL) 500 MG tablet Take 500 mg by mouth every 6 (six) hours as needed for moderate pain or headache.   Past Week at Unknown time  . Prenatal Vit-Fe Fumarate-FA (PRENATAL MULTIVITAMIN) TABS tablet Take 1 tablet by mouth daily at 12 noon. Reported on 11/07/2015   04/21/2016 at Unknown time   No results found for this or any previous visit (from the past 48 hour(s)).  Review of  Systems  Constitutional: Negative for fever and chills.  Gastrointestinal: Negative for nausea, vomiting and abdominal pain.  Genitourinary: Negative for dysuria and urgency.   Physical Exam   Blood pressure 111/68, pulse 92, temperature 98.4 F (36.9 C), temperature source Oral, resp. rate 18, height 5\' 3"  (1.6 m), weight 143 lb (64.864 kg), last menstrual period 11/03/2015, unknown if currently breastfeeding.  Physical Exam  Constitutional: She is oriented to person, place, and time. She appears well-developed and well-nourished. No distress.  HENT:  Head: Normocephalic.  Musculoskeletal: Normal range of motion.  Neurological: She is alert and oriented to person, place, and time.  Skin: Skin is warm. She is not diaphoretic.  Psychiatric: Her behavior is normal.   Fetal Tracing: Baseline: 135 bpm  Variability: Moderate  Accelerations: 15x15 Decelerations: None Toco: Quiet   MAU Course  Procedures  None  MDM  NST   Assessment and Plan   A:  1. Decreased fetal movement in pregnancy, second trimester, fetus 1   2. NST (non-stress test) reactive     P:  Discharge home in stable condition Discussed normal fetal movement  pattern @ 24 weeks Follow up with OB as scheduled Return to MAU if symptoms worsen    Duane LopeJennifer I Rasch, NP 04/22/2016 2:58 PM

## 2016-04-22 NOTE — MAU Note (Signed)
Urine sent to lab 

## 2016-04-22 NOTE — MAU Note (Signed)
States the baby has not been moving as much since yesterday

## 2016-04-22 NOTE — Discharge Instructions (Signed)
Second Trimester of Pregnancy  The second trimester is from week 13 through week 28, month 4 through 6. This is often the time in pregnancy that you feel your best. Often times, morning sickness has lessened or quit. You may have more energy, and you may get hungry more often. Your unborn baby (fetus) is growing rapidly. At the end of the sixth month, he or she is about 9 inches long and weighs about 1½ pounds. You will likely feel the baby move (quickening) between 18 and 20 weeks of pregnancy.  HOME CARE   · Avoid all smoking, herbs, and alcohol. Avoid drugs not approved by your doctor.  · Do not use any tobacco products, including cigarettes, chewing tobacco, and electronic cigarettes. If you need help quitting, ask your doctor. You may get counseling or other support to help you quit.  · Only take medicine as told by your doctor. Some medicines are safe and some are not during pregnancy.  · Exercise only as told by your doctor. Stop exercising if you start having cramps.  · Eat regular, healthy meals.  · Wear a good support bra if your breasts are tender.  · Do not use hot tubs, steam rooms, or saunas.  · Wear your seat belt when driving.  · Avoid raw meat, uncooked cheese, and liter boxes and soil used by cats.  · Take your prenatal vitamins.  · Take 1500-2000 milligrams of calcium daily starting at the 20th week of pregnancy until you deliver your baby.  · Try taking medicine that helps you poop (stool softener) as needed, and if your doctor approves. Eat more fiber by eating fresh fruit, vegetables, and whole grains. Drink enough fluids to keep your pee (urine) clear or pale yellow.  · Take warm water baths (sitz baths) to soothe pain or discomfort caused by hemorrhoids. Use hemorrhoid cream if your doctor approves.  · If you have puffy, bulging veins (varicose veins), wear support hose. Raise (elevate) your feet for 15 minutes, 3-4 times a day. Limit salt in your diet.  · Avoid heavy lifting, wear low heals,  and sit up straight.  · Rest with your legs raised if you have leg cramps or low back pain.  · Visit your dentist if you have not gone during your pregnancy. Use a soft toothbrush to brush your teeth. Be gentle when you floss.  · You can have sex (intercourse) unless your doctor tells you not to.  · Go to your doctor visits.  GET HELP IF:   · You feel dizzy.  · You have mild cramps or pressure in your lower belly (abdomen).  · You have a nagging pain in your belly area.  · You continue to feel sick to your stomach (nauseous), throw up (vomit), or have watery poop (diarrhea).  · You have bad smelling fluid coming from your vagina.  · You have pain with peeing (urination).  GET HELP RIGHT AWAY IF:   · You have a fever.  · You are leaking fluid from your vagina.  · You have spotting or bleeding from your vagina.  · You have severe belly cramping or pain.  · You lose or gain weight rapidly.  · You have trouble catching your breath and have chest pain.  · You notice sudden or extreme puffiness (swelling) of your face, hands, ankles, feet, or legs.  · You have not felt the baby move in over an hour.  · You have severe headaches that do   not go away with medicine.  · You have vision changes.     This information is not intended to replace advice given to you by your health care provider. Make sure you discuss any questions you have with your health care provider.     Document Released: 01/12/2010 Document Revised: 11/08/2014 Document Reviewed: 12/19/2012  Elsevier Interactive Patient Education ©2016 [employer]

## 2016-05-25 ENCOUNTER — Ambulatory Visit (INDEPENDENT_AMBULATORY_CARE_PROVIDER_SITE_OTHER): Payer: Managed Care, Other (non HMO) | Admitting: Obstetrics and Gynecology

## 2016-05-25 VITALS — BP 121/77 | HR 103 | Wt 154.0 lb

## 2016-05-25 DIAGNOSIS — O9989 Other specified diseases and conditions complicating pregnancy, childbirth and the puerperium: Secondary | ICD-10-CM

## 2016-05-25 DIAGNOSIS — Z23 Encounter for immunization: Secondary | ICD-10-CM

## 2016-05-25 DIAGNOSIS — Z36 Encounter for antenatal screening of mother: Secondary | ICD-10-CM | POA: Diagnosis not present

## 2016-05-25 DIAGNOSIS — Z2839 Other underimmunization status: Secondary | ICD-10-CM

## 2016-05-25 DIAGNOSIS — Z3493 Encounter for supervision of normal pregnancy, unspecified, third trimester: Secondary | ICD-10-CM

## 2016-05-25 DIAGNOSIS — Z283 Underimmunization status: Secondary | ICD-10-CM

## 2016-05-25 DIAGNOSIS — Z3483 Encounter for supervision of other normal pregnancy, third trimester: Secondary | ICD-10-CM

## 2016-05-25 NOTE — Progress Notes (Signed)
28 wk labs today 

## 2016-05-25 NOTE — Addendum Note (Signed)
Addended by: Arne Cleveland on: 05/25/2016 04:40 PM   Modules accepted: Orders

## 2016-05-25 NOTE — Progress Notes (Signed)
Subjective:  Alyssa Gutierrez is a 30 y.o. G3P1011 at 67w1dbeing seen today for ongoing prenatal care.  She is currently monitored for the following issues for this low-risk pregnancy and has Migraine headache; IBS (irritable bowel syndrome); Supervision of normal pregnancy; and Rubella non-immune status, antepartum on her problem list.  Patient reports no complaints.  Contractions: Irritability. Vag. Bleeding: None.  Movement: Present. Denies leaking of fluid.   The following portions of the patient's history were reviewed and updated as appropriate: allergies, current medications, past family history, past medical history, past social history, past surgical history and problem list. Problem list updated.  Objective:   Vitals:   05/25/16 1557  BP: 121/77  Pulse: (!) 103  Weight: 154 lb (69.9 kg)    Fetal Status: Fetal Heart Rate (bpm): 133 Fundal Height: 29 cm Movement: Present     General:  Alert, oriented and cooperative. Patient is in no acute distress.  Skin: Skin is warm and dry. No rash noted.   Cardiovascular: Normal heart rate noted  Respiratory: Normal respiratory effort, no problems with respiration noted  Abdomen: Soft, gravid, appropriate for gestational age. Pain/Pressure: Absent     Pelvic:  Cervical exam deferred        Extremities: Normal range of motion.  Edema: None  Mental Status: Normal mood and affect. Normal behavior. Normal judgment and thought content.   Urinalysis:      Assessment and Plan:  Pregnancy: G3P1011 at 245w1d1. Supervision of normal pregnancy, third trimester Patient is doing well Third trimester labs today and tdap - Glucose Tolerance, 1 HR (50g)  2. Rubella non-immune status, antepartum Will receive MMR pp  Preterm labor symptoms and general obstetric precautions including but not limited to vaginal bleeding, contractions, leaking of fluid and fetal movement were reviewed in detail with the patient. Please refer to After Visit Summary  for other counseling recommendations.  Return in about 4 weeks (around 06/22/2016).   PeMora BellmanMD

## 2016-05-26 LAB — RPR

## 2016-05-26 LAB — CBC
HEMATOCRIT: 36.7 % (ref 35.0–45.0)
HEMOGLOBIN: 11.9 g/dL (ref 11.7–15.5)
MCH: 29.5 pg (ref 27.0–33.0)
MCHC: 32.4 g/dL (ref 32.0–36.0)
MCV: 90.8 fL (ref 80.0–100.0)
MPV: 9.1 fL (ref 7.5–12.5)
Platelets: 246 10*3/uL (ref 140–400)
RBC: 4.04 MIL/uL (ref 3.80–5.10)
RDW: 13.5 % (ref 11.0–15.0)
WBC: 12.3 10*3/uL — AB (ref 3.8–10.8)

## 2016-05-26 LAB — GLUCOSE TOLERANCE, 1 HOUR (50G) W/O FASTING: Glucose, 1 Hr, gestational: 118 mg/dL (ref <140)

## 2016-05-26 LAB — HIV ANTIBODY (ROUTINE TESTING W REFLEX): HIV: NONREACTIVE

## 2016-06-09 ENCOUNTER — Encounter (INDEPENDENT_AMBULATORY_CARE_PROVIDER_SITE_OTHER): Payer: Managed Care, Other (non HMO) | Admitting: *Deleted

## 2016-06-09 DIAGNOSIS — Z3483 Encounter for supervision of other normal pregnancy, third trimester: Secondary | ICD-10-CM

## 2016-06-23 ENCOUNTER — Ambulatory Visit (INDEPENDENT_AMBULATORY_CARE_PROVIDER_SITE_OTHER): Payer: Managed Care, Other (non HMO) | Admitting: Family Medicine

## 2016-06-23 VITALS — BP 120/73 | HR 109 | Wt 161.0 lb

## 2016-06-23 DIAGNOSIS — O9989 Other specified diseases and conditions complicating pregnancy, childbirth and the puerperium: Secondary | ICD-10-CM

## 2016-06-23 DIAGNOSIS — Z3493 Encounter for supervision of normal pregnancy, unspecified, third trimester: Secondary | ICD-10-CM

## 2016-06-23 DIAGNOSIS — O09899 Supervision of other high risk pregnancies, unspecified trimester: Secondary | ICD-10-CM

## 2016-06-23 DIAGNOSIS — Z283 Underimmunization status: Secondary | ICD-10-CM

## 2016-06-23 NOTE — Progress Notes (Signed)
Pt noticed a thin discharge several times but has resolved since then.

## 2016-06-23 NOTE — Progress Notes (Signed)
Subjective:  Alyssa Gutierrez is a 30 y.o. G3P1011 at 57w2dbeing seen today for ongoing prenatal care.  She is currently monitored for the following issues for this low-risk pregnancy and has Migraine headache; IBS (irritable bowel syndrome); Supervision of normal pregnancy; and Rubella non-immune status, antepartum on her problem list.  Patient reports no complaints.  Contractions: Irregular. Vag. Bleeding: None.  Movement: Present. Denies leaking of fluid.   The following portions of the patient's history were reviewed and updated as appropriate: allergies, current medications, past family history, past medical history, past social history, past surgical history and problem list. Problem list updated.  Objective:   Vitals:   06/23/16 1510  BP: 120/73  Pulse: (!) 109  Weight: 161 lb (73 kg)    Fetal Status: Fetal Heart Rate (bpm): 159 Fundal Height: 32 cm Movement: Present     General:  Alert, oriented and cooperative. Patient is in no acute distress.  Skin: Skin is warm and dry. No rash noted.   Cardiovascular: Normal heart rate noted  Respiratory: Normal respiratory effort, no problems with respiration noted  Abdomen: Soft, gravid, appropriate for gestational age. Pain/Pressure: Present     Pelvic:  Cervical exam deferred        Extremities: Normal range of motion.  Edema: None  Mental Status: Normal mood and affect. Normal behavior. Normal judgment and thought content.   Urinalysis: Urine Protein: Trace Urine Glucose: Negative  Assessment and Plan:  Pregnancy: G3P1011 at 392w2d1. Supervision of normal pregnancy, third trimester - UTD - Discussed waterbirth and signed consent. Last delivery was waterbirth 2 yrs ago  2. Rubella non-immune status, antepartum Needs MMR pp  Preterm labor symptoms and general obstetric precautions including but not limited to vaginal bleeding, contractions, leaking of fluid and fetal movement were reviewed in detail with the patient.  Please  refer to After Visit Summary for other counseling recommendations.  Return in about 4 weeks (around 07/21/2016) for Routine prenatal care.   KiCaren MacadamMD

## 2016-06-23 NOTE — Patient Instructions (Signed)

## 2016-06-24 ENCOUNTER — Encounter: Payer: Self-pay | Admitting: *Deleted

## 2016-06-28 ENCOUNTER — Encounter: Payer: Self-pay | Admitting: Family Medicine

## 2016-07-13 ENCOUNTER — Ambulatory Visit (INDEPENDENT_AMBULATORY_CARE_PROVIDER_SITE_OTHER): Payer: Managed Care, Other (non HMO) | Admitting: Obstetrics & Gynecology

## 2016-07-13 VITALS — BP 134/75 | HR 106 | Wt 170.0 lb

## 2016-07-13 DIAGNOSIS — Z113 Encounter for screening for infections with a predominantly sexual mode of transmission: Secondary | ICD-10-CM | POA: Diagnosis not present

## 2016-07-13 DIAGNOSIS — Z3493 Encounter for supervision of normal pregnancy, unspecified, third trimester: Secondary | ICD-10-CM

## 2016-07-13 NOTE — Patient Instructions (Signed)
Return to clinic for any scheduled appointments or obstetric concerns, or go to MAU for evaluation  

## 2016-07-13 NOTE — Progress Notes (Signed)
   PRENATAL VISIT NOTE  Subjective:  Alyssa Gutierrez is a 30 y.o. G3P1011 at 2582w1d being seen today for ongoing prenatal care.  She is currently monitored for the following issues for this low-risk pregnancy and has Migraine headache; IBS (irritable bowel syndrome); Supervision of normal pregnancy; and Rubella non-immune status, antepartum on her problem list.  Patient reports no complaints.  Contractions: Irregular. Vag. Bleeding: None.  Movement: Present. Denies leaking of fluid.   The following portions of the patient's history were reviewed and updated as appropriate: allergies, current medications, past family history, past medical history, past social history, past surgical history and problem list. Problem list updated.  Objective:   Vitals:   07/13/16 1536  BP: 134/75  Pulse: (!) 106  Weight: 170 lb (77.1 kg)    Fetal Status: Fetal Heart Rate (bpm): 150 Fundal Height: 36 cm Movement: Present  Presentation: Vertex  General:  Alert, oriented and cooperative. Patient is in no acute distress.  Skin: Skin is warm and dry. No rash noted.   Cardiovascular: Normal heart rate noted  Respiratory: Normal respiratory effort, no problems with respiration noted  Abdomen: Soft, gravid, appropriate for gestational age. Pain/Pressure: Present     Pelvic:  Cervical exam performed Dilation: 3 Effacement (%): 70 Station: -3.  Pelvic cultures done.   Extremities: Normal range of motion.  Edema: None  Mental Status: Normal mood and affect. Normal behavior. Normal judgment and thought content.   Urinalysis: Urine Protein: Trace Urine Glucose: Negative  Assessment and Plan:  Pregnancy: G3P1011 at 6182w1d  1. Supervision of normal pregnancy, third trimester Will do waterbirth. - [facility]lam[facility] - Culture, beta strep (group b only) Preterm labor symptoms and general obstetric precautions including but not limited to vaginal bleeding, contractions, leaking of fluid  and fetal movement were reviewed in detail with the patient. Please refer to After Visit Summary for other counseling recommendations.  Return in about 2 weeks (around 07/27/2016) for OB Visit.  Tereso NewcomerUgonna A Anyanwu, MD

## 2016-07-15 LAB — GC/CHLAMYDIA PROBE AMP (~~LOC~~) NOT AT ARMC
Chlamydia: NEGATIVE
Neisseria Gonorrhea: NEGATIVE

## 2016-07-15 LAB — CULTURE, BETA STREP (GROUP B ONLY)

## 2016-07-20 NOTE — Telephone Encounter (Signed)
Do I need to put something else?

## 2016-07-27 ENCOUNTER — Ambulatory Visit (INDEPENDENT_AMBULATORY_CARE_PROVIDER_SITE_OTHER): Payer: Managed Care, Other (non HMO) | Admitting: Obstetrics & Gynecology

## 2016-07-27 VITALS — BP 110/72 | HR 102 | Wt 169.0 lb

## 2016-07-27 DIAGNOSIS — Z3493 Encounter for supervision of normal pregnancy, unspecified, third trimester: Secondary | ICD-10-CM

## 2016-07-27 NOTE — Progress Notes (Signed)
   PRENATAL VISIT NOTE  Subjective:  Alyssa Gutierrez is a 30 y.o. G3P1011 at [redacted]w[redacted]d being seen today for ongoing prenatal care.  She is currently monitored for the following issues for this low-risk pregnancy and has Migraine headache; IBS (irritable bowel syndrome); Supervision of normal pregnancy; and Rubella non-immune status, antepartum on her problem list.  Patient reports no complaints.  Contractions: Irregular. Vag. Bleeding: None.  Movement: Present. Denies leaking of fluid.   The following portions of the patient's history were reviewed and updated as appropriate: allergies, current medications, past family history, past medical history, past social history, past surgical history and problem list. Problem list updated.  Objective:   Vitals:   07/27/16 1625  BP: 110/72  Pulse: (!) 102  Weight: 169 lb (76.7 kg)    Fetal Status: Fetal Heart Rate (bpm): 154   Movement: Present     General:  Alert, oriented and cooperative. Patient is in no acute distress.  Skin: Skin is warm and dry. No rash noted.   Cardiovascular: Normal heart rate noted  Respiratory: Normal respiratory effort, no problems with respiration noted  Abdomen: Soft, gravid, appropriate for gestational age. Pain/Pressure: Present     Pelvic:  Cervical exam performed        Extremities: Normal range of motion.  Edema: None  Mental Status: Normal mood and affect. Normal behavior. Normal judgment and thought content.   Urinalysis:      Assessment and Plan:  Pregnancy: G3P1011 at 299w1d  1. Supervision of normal pregnancy, third trimester   Term labor symptoms and general obstetric precautions including but not limited to vaginal bleeding, contractions, leaking of fluid and fetal movement were reviewed in detail with the patient. Please refer to After Visit Summary for other counseling recommendations.  No Follow-up on file.  Allie BossierMyra C Nickey Kloepfer, MD

## 2016-08-03 ENCOUNTER — Encounter: Payer: Self-pay | Admitting: *Deleted

## 2016-08-04 ENCOUNTER — Ambulatory Visit (INDEPENDENT_AMBULATORY_CARE_PROVIDER_SITE_OTHER): Payer: Managed Care, Other (non HMO) | Admitting: Family Medicine

## 2016-08-04 VITALS — BP 115/71 | HR 101 | Wt 169.0 lb

## 2016-08-04 DIAGNOSIS — Z283 Underimmunization status: Secondary | ICD-10-CM

## 2016-08-04 DIAGNOSIS — Z3483 Encounter for supervision of other normal pregnancy, third trimester: Secondary | ICD-10-CM

## 2016-08-04 DIAGNOSIS — O09899 Supervision of other high risk pregnancies, unspecified trimester: Secondary | ICD-10-CM

## 2016-08-04 DIAGNOSIS — Z3480 Encounter for supervision of other normal pregnancy, unspecified trimester: Secondary | ICD-10-CM

## 2016-08-04 DIAGNOSIS — O9989 Other specified diseases and conditions complicating pregnancy, childbirth and the puerperium: Secondary | ICD-10-CM

## 2016-08-04 NOTE — Progress Notes (Signed)
   PRENATAL VISIT NOTE  Subjective:  Alyssa Gutierrez is a 30 y.o. G3P1011 at 71w2dbeing seen today for ongoing prenatal care.  She is currently monitored for the following issues for this low-risk pregnancy and has Migraine headache; IBS (irritable bowel syndrome); Supervision of normal pregnancy; and Rubella non-immune status, antepartum on her problem list.  Patient reports no complaints.  Contractions: Irregular. Vag. Bleeding: None.  Movement: Present. Denies leaking of fluid.   The following portions of the patient's history were reviewed and updated as appropriate: allergies, current medications, past family history, past medical history, past social history, past surgical history and problem list. Problem list updated.  Objective:   Vitals:   08/04/16 1622  BP: 115/71  Pulse: (!) 101  Weight: 169 lb (76.7 kg)    Fetal Status: Fetal Heart Rate (bpm): 127 Fundal Height: 39 cm Movement: Present  Presentation: Vertex  General:  Alert, oriented and cooperative. Patient is in no acute distress.  Skin: Skin is warm and dry. No rash noted.   Cardiovascular: Normal heart rate noted  Respiratory: Normal respiratory effort, no problems with respiration noted  Abdomen: Soft, gravid, appropriate for gestational age. Pain/Pressure: Present     Pelvic:  Cervical exam performed Dilation: 4.5 Effacement (%): 80 Station: -2  Extremities: Normal range of motion.  Edema: None  Mental Status: Normal mood and affect. Normal behavior. Normal judgment and thought content.   Urinalysis: Urine Protein: Trace Urine Glucose: Negative  Assessment and Plan:  Pregnancy: G3P1011 at 313w2d1. Supervision of other normal pregnancy, antepartum -Making slow cervical change -Reviewed sweeping membranes, declined today  2. Rubella non-immune status, antepartum Will need pp MMR  Term labor symptoms and general obstetric precautions including but not limited to vaginal bleeding, contractions, leaking of  fluid and fetal movement were reviewed in detail with the patient. Please refer to After Visit Summary for other counseling recommendations.   Return in about 1 week (around 08/11/2016) for Routine prenatal care- sweeping membranes.  KiCaren MacadamMD

## 2016-08-04 NOTE — Patient Instructions (Addendum)
If you get to 1 week after your due date we recommend starting your labor. We will work on getting this scheduled. You can expect to have the date of your induction at your next visit.    Labor Induction Labor induction is when steps are taken to cause a pregnant woman to begin the labor process. Most women go into labor on their own between 37 weeks and 42 weeks of the pregnancy. When this does not happen or when there is a medical need, methods may be used to induce labor. Labor induction causes a pregnant woman's uterus to contract. It also causes the cervix to soften (ripen), open (dilate), and thin out (efface). Usually, labor is not induced before 39 weeks of the pregnancy unless there is a problem with the baby or mother.  Before inducing labor, your health care provider will consider a number of factors, including the following:  The medical condition of you and the baby.   How many weeks along you are.   The status of the baby's lung maturity.   The condition of the cervix.   The position of the baby.  WHAT ARE THE REASONS FOR LABOR INDUCTION? Labor may be induced for the following reasons:  The health of the baby or mother is at risk.   The pregnancy is overdue by 1 week or more.   The water breaks but labor does not start on its own.   The mother has a health condition or serious illness, such as high blood pressure, infection, placental abruption, or diabetes.  The amniotic fluid amounts are low around the baby.   The baby is distressed.  Convenience or wanting the baby to be born on a certain date is not a reason for inducing labor. WHAT METHODS ARE USED FOR LABOR INDUCTION? Several methods of labor induction may be used, such as:   Prostaglandin medicine. This medicine causes the cervix to dilate and ripen. The medicine will also start contractions. It can be taken by mouth or by inserting a suppository into the vagina.   Inserting a thin tube (catheter)  with a balloon on the end into the vagina to dilate the cervix. Once inserted, the balloon is expanded with water, which causes the cervix to open.   Stripping the membranes. Your health care provider separates amniotic sac tissue from the cervix, causing the cervix to be stretched and causing the release of a hormone called progesterone. This may cause the uterus to contract. It is often done during an office visit. You will be sent home to wait for the contractions to begin. You will then come in for an induction.   Breaking the water. Your health care provider makes a hole in the amniotic sac using a small instrument. Once the amniotic sac breaks, contractions should begin. This may still take hours to see an effect.   Medicine to trigger or strengthen contractions. This medicine is given through an IV access tube inserted into a vein in your arm.  All of the methods of induction, besides stripping the membranes, will be done in the hospital. Induction is done in the hospital so that you and the baby can be carefully monitored.  HOW LONG DOES IT TAKE FOR LABOR TO BE INDUCED? Some inductions can take up to 2-3 days. Depending on the cervix, it usually takes less time. It takes longer when you are induced early in the pregnancy or if this is your first pregnancy. If a mother is still pregnant  and the induction has been going on for 2-3 days, either the mother will be sent home or a cesarean delivery will be needed. WHAT ARE THE RISKS ASSOCIATED WITH LABOR INDUCTION? Some of the risks of induction include:   Changes in fetal heart rate, such as too high, too low, or erratic.   Fetal distress.   Chance of infection for the mother and baby.   Increased chance of having a cesarean delivery.   Breaking off (abruption) of the placenta from the uterus (rare).   Uterine rupture (very rare).  When induction is needed for medical reasons, the benefits of induction may outweigh the  risks. WHAT ARE SOME REASONS FOR NOT INDUCING LABOR? Labor induction should not be done if:   It is shown that your baby does not tolerate labor.   You have had previous surgeries on your uterus, such as a myomectomy or the removal of fibroids.   Your placenta lies very low in the uterus and blocks the opening of the cervix (placenta previa).   Your baby is not in a head-down position.   The umbilical cord drops down into the birth canal in front of the baby. This could cut off the baby's blood and oxygen supply.   You have had a previous cesarean delivery.   There are unusual circumstances, such as the baby being extremely premature.    This information is not intended to replace advice given to you by your health care provider. Make sure you discuss any questions you have with your health care provider.   Document Released: 03/09/2007 Document Revised: 11/08/2014 Document Reviewed: 05/17/2013 Elsevier Interactive Patient Education [employer]

## 2016-08-09 ENCOUNTER — Inpatient Hospital Stay (HOSPITAL_COMMUNITY)
Admission: AD | Admit: 2016-08-09 | Discharge: 2016-08-10 | DRG: 775 | Disposition: A | Discharge status: Home / Self Care | Payer: Managed Care, Other (non HMO) | Source: Ambulatory Visit | Attending: Obstetrics & Gynecology | Admitting: Obstetrics & Gynecology

## 2016-08-09 ENCOUNTER — Encounter (HOSPITAL_COMMUNITY): Payer: Self-pay

## 2016-08-09 DIAGNOSIS — Z283 Underimmunization status: Secondary | ICD-10-CM

## 2016-08-09 DIAGNOSIS — Z8249 Family history of ischemic heart disease and other diseases of the circulatory system: Secondary | ICD-10-CM | POA: Diagnosis not present

## 2016-08-09 DIAGNOSIS — O4202 Full-term premature rupture of membranes, onset of labor within 24 hours of rupture: Secondary | ICD-10-CM

## 2016-08-09 DIAGNOSIS — Z3A40 40 weeks gestation of pregnancy: Secondary | ICD-10-CM

## 2016-08-09 DIAGNOSIS — Z3483 Encounter for supervision of other normal pregnancy, third trimester: Secondary | ICD-10-CM | POA: Diagnosis present

## 2016-08-09 DIAGNOSIS — Z833 Family history of diabetes mellitus: Secondary | ICD-10-CM | POA: Diagnosis not present

## 2016-08-09 DIAGNOSIS — O9989 Other specified diseases and conditions complicating pregnancy, childbirth and the puerperium: Secondary | ICD-10-CM

## 2016-08-09 DIAGNOSIS — Z3480 Encounter for supervision of other normal pregnancy, unspecified trimester: Secondary | ICD-10-CM

## 2016-08-09 DIAGNOSIS — O09899 Supervision of other high risk pregnancies, unspecified trimester: Secondary | ICD-10-CM

## 2016-08-09 LAB — CBC
HCT: 38.2 % (ref 36.0–46.0)
HEMOGLOBIN: 12.9 g/dL (ref 12.0–15.0)
MCH: 29.5 pg (ref 26.0–34.0)
MCHC: 33.8 g/dL (ref 30.0–36.0)
MCV: 87.4 fL (ref 78.0–100.0)
Platelets: 254 10*3/uL (ref 150–400)
RBC: 4.37 MIL/uL (ref 3.87–5.11)
RDW: 14.3 % (ref 11.5–15.5)
WBC: 13.2 10*3/uL — ABNORMAL HIGH (ref 4.0–10.5)

## 2016-08-09 LAB — TYPE AND SCREEN
ABO/RH(D): O POS
ANTIBODY SCREEN: NEGATIVE

## 2016-08-09 LAB — RPR: RPR: NONREACTIVE

## 2016-08-09 LAB — ABO/RH: ABO/RH(D): O POS

## 2016-08-09 MED ORDER — DIBUCAINE 1 % RE OINT: 1.0000 "application " | TOPICAL_OINTMENT | RECTAL | Status: DC | PRN

## 2016-08-09 MED ORDER — LACTATED RINGERS IV SOLN: INTRAVENOUS | Status: DC

## 2016-08-09 MED ORDER — ONDANSETRON HCL 4 MG PO TABS: 4.0000 mg | ORAL_TABLET | ORAL | Status: DC | PRN

## 2016-08-09 MED ORDER — BENZOCAINE-MENTHOL 20-0.5 % EX AERO
1.0000 "application " | INHALATION_SPRAY | CUTANEOUS | Status: DC | PRN
  Administered 2016-08-09: 1 via TOPICAL
  Filled 2016-08-09: qty 56

## 2016-08-09 MED ORDER — OXYTOCIN BOLUS FROM INFUSION
500.0000 mL | Freq: Once | INTRAVENOUS | Status: AC
  Administered 2016-08-09: 500 mL via INTRAVENOUS

## 2016-08-09 MED ORDER — IBUPROFEN 600 MG PO TABS
600.0000 mg | ORAL_TABLET | Freq: Four times a day (QID) | ORAL | Status: DC
  Administered 2016-08-09 – 2016-08-10 (×6): 600 mg via ORAL
  Filled 2016-08-09 (×6): qty 1

## 2016-08-09 MED ORDER — OXYCODONE-ACETAMINOPHEN 5-325 MG PO TABS: 2.0000 | ORAL_TABLET | ORAL | Status: DC | PRN

## 2016-08-09 MED ORDER — OXYTOCIN 40 UNITS IN LACTATED RINGERS INFUSION - SIMPLE MED
2.5000 [IU]/h | INTRAVENOUS | Status: DC
  Filled 2016-08-09: qty 1000

## 2016-08-09 MED ORDER — MEASLES, MUMPS & RUBELLA VAC ~~LOC~~ INJ
0.5000 mL | INJECTION | Freq: Once | SUBCUTANEOUS | Status: AC
  Administered 2016-08-10: 0.5 mL via SUBCUTANEOUS
  Filled 2016-08-09: qty 0.5

## 2016-08-09 MED ORDER — SIMETHICONE 80 MG PO CHEW: 80.0000 mg | CHEWABLE_TABLET | ORAL | Status: DC | PRN

## 2016-08-09 MED ORDER — TETANUS-DIPHTH-ACELL PERTUSSIS 5-2.5-18.5 LF-MCG/0.5 IM SUSP: 0.5000 mL | Freq: Once | INTRAMUSCULAR | Status: DC

## 2016-08-09 MED ORDER — OXYCODONE-ACETAMINOPHEN 5-325 MG PO TABS: 1.0000 | ORAL_TABLET | Freq: Four times a day (QID) | ORAL | Status: DC | PRN

## 2016-08-09 MED ORDER — ONDANSETRON HCL 4 MG/2ML IJ SOLN: 4.0000 mg | INTRAMUSCULAR | Status: DC | PRN

## 2016-08-09 MED ORDER — ZOLPIDEM TARTRATE 5 MG PO TABS: 5.0000 mg | ORAL_TABLET | Freq: Every evening | ORAL | Status: DC | PRN

## 2016-08-09 MED ORDER — WITCH HAZEL-GLYCERIN EX PADS
1.0000 | MEDICATED_PAD | CUTANEOUS | Status: DC | PRN
Start: 2016-08-09 — End: 2016-08-10

## 2016-08-09 MED ORDER — OXYCODONE-ACETAMINOPHEN 5-325 MG PO TABS
1.0000 | ORAL_TABLET | ORAL | Status: DC | PRN
  Administered 2016-08-09: 1 via ORAL
  Filled 2016-08-09: qty 1

## 2016-08-09 MED ORDER — SENNOSIDES-DOCUSATE SODIUM 8.6-50 MG PO TABS
2.0000 | ORAL_TABLET | ORAL | Status: DC
  Administered 2016-08-09: 2 via ORAL
  Filled 2016-08-09: qty 2

## 2016-08-09 MED ORDER — SOD CITRATE-CITRIC ACID 500-334 MG/5ML PO SOLN: 30.0000 mL | ORAL | Status: DC | PRN

## 2016-08-09 MED ORDER — DIPHENHYDRAMINE HCL 25 MG PO CAPS: 25.0000 mg | ORAL_CAPSULE | Freq: Four times a day (QID) | ORAL | Status: DC | PRN

## 2016-08-09 MED ORDER — FLEET ENEMA 7-19 GM/118ML RE ENEM: 1.0000 | ENEMA | RECTAL | Status: DC | PRN

## 2016-08-09 MED ORDER — ONDANSETRON HCL 4 MG/2ML IJ SOLN
4.0000 mg | Freq: Four times a day (QID) | INTRAMUSCULAR | Status: DC | PRN
  Filled 2016-08-09: qty 2

## 2016-08-09 MED ORDER — ACETAMINOPHEN 325 MG PO TABS
650.0000 mg | ORAL_TABLET | ORAL | Status: DC | PRN
  Administered 2016-08-09 – 2016-08-10 (×2): 650 mg via ORAL
  Filled 2016-08-09 (×3): qty 2

## 2016-08-09 MED ORDER — ACETAMINOPHEN 325 MG PO TABS: 650.0000 mg | ORAL_TABLET | ORAL | Status: DC | PRN

## 2016-08-09 MED ORDER — LIDOCAINE HCL (PF) 1 % IJ SOLN
30.0000 mL | INTRAMUSCULAR | Status: DC | PRN
  Administered 2016-08-09: 30 mL via SUBCUTANEOUS
  Filled 2016-08-09: qty 30

## 2016-08-09 MED ORDER — COCONUT OIL OIL
1.0000 | TOPICAL_OIL | Status: DC | PRN
Start: 2016-08-09 — End: 2016-08-10

## 2016-08-09 MED ORDER — PRENATAL MULTIVITAMIN CH
1.0000 | ORAL_TABLET | Freq: Every day | ORAL | Status: DC
  Administered 2016-08-09 – 2016-08-10 (×2): 1 via ORAL
  Filled 2016-08-09 (×2): qty 1

## 2016-08-09 MED ORDER — LACTATED RINGERS IV SOLN: 500.0000 mL | INTRAVENOUS | Status: DC | PRN

## 2016-08-09 NOTE — Lactation Note (Signed)
This note was copied from a baby's chart. Lactation Consultation Note  Mom reports that her older child had a tongue tie and needed a NS to latch. This child, [name] has latched 2x without one.  Mom would like him evaluated for tongue and lip tie.  Discussed with her that the function of the tongue was more important than the appearance.  She is to call for observation with a future feeding.  Mom was taught hand expression with colostrum visible.  Information given on support groups and outpatient services.  Patient Name: Alyssa Gutierrez UEAVW'UToday's Date: 08/09/2016 Reason for consult: Initial assessment   Maternal Data Has patient been taught Hand Expression?: Yes Does the patient have breastfeeding experience prior to this delivery?: Yes  Feeding Feeding Type: Breast Fed Length of feed: 15 min  LATCH Score/Interventions Latch:  (encouraged mother to call for next latch)                    Lactation Tools Discussed/Used     Consult Status Consult Status: Follow-up Date: 08/10/16 Follow-up type: In-patient    Soyla DryerJoseph, Abbott Jasinski 08/09/2016, 11:59 AM

## 2016-08-09 NOTE — H&P (Signed)
LABOR AND DELIVERY ADMISSION HISTORY AND PHYSICAL NOTE  Alyssa Gutierrez is a 30 y.o. female G3P1011 with IUP at [redacted]w[redacted]d by LMP presenting for SOL. She started having contractions 4 hours ago. The contractions were occurring every 5-8 minutes.   She reports positive fetal movement. She denies leakage of fluid or vaginal bleeding.  Prenatal History/Complications: None  Past Medical History: Past Medical History:  Diagnosis Date  . Family history of heart murmur    at birth  . History of allergy   . History of chicken pox    age 22  . History of headache   . History of migraine   . IBS (irritable bowel syndrome)   . Nausea 03/14/2015   Around day of ovulation every month      Past Surgical History: Past Surgical History:  Procedure Laterality Date  . ROOT CANAL     age 24  . WISDOM TOOTH EXTRACTION     age 19    Obstetrical History: OB History    Gravida Para Term Preterm AB Living   3 1 1 0 1 1   SAB TAB Ectopic Multiple Live Births   1 0 0 0 1      Social History: Social History   Social History  . Marital status: Married    Spouse name: N/A  . Number of children: 0  . Years of education: N/A   Occupational History  . facilities coordinator [employer]   Social History Main Topics  . Smoking status: Never Smoker  . Smokeless tobacco: Never Used  . Alcohol use No  . Drug use: No  . Sexual activity: Yes    Birth control/ protection: None   Other Topics Concern  . None   Social History Narrative  . None    Family History: Family History  Problem Relation Age of Onset  . Hypertension Father   . Diabetes Paternal Grandmother   . Heart murmur Brother   . Hypertension Paternal Uncle   . Crohn's disease Maternal Grandmother   . Colon cancer Neg Hx     Allergies: No Known Allergies  Prescriptions Prior to Admission  Medication Sig Dispense Refill Last Dose  . acetaminophen (TYLENOL) 500 MG tablet Take 500 mg by mouth every 6 (six) hours as needed for  moderate pain or headache.   Taking  . Prenatal Vit-Fe Fumarate-FA (PRENATAL MULTIVITAMIN) TABS tablet Take 1 tablet by mouth daily at 12 noon. Reported on 11/07/2015   Taking     Review of Systems   All systems reviewed and negative except as stated in HPI  Blood pressure 124/67, pulse 97, temperature 98.5 F (36.9 C), temperature source Oral, resp. rate 18, last menstrual period 11/03/2015, SpO2 98 %, unknown if currently breastfeeding. General appearance: alert and cooperative Lungs: clear to auscultation bilaterally Heart: regular rate and rhythm Abdomen: soft, non-tender; bowel sounds normal Extremities: No calf swelling or tenderness Presentation: cephalic by exam Fetal monitoring: Category I Uterine activity: Moderate contractions every 4-6 minutes Dilation: 7 Effacement (%): 80 Station: -1, 0 Exam by:: Danielle Simpson RN   Prenatal labs: ABO, Rh: O/POS/-- (02/28 1608) Antibody: NEG (02/28 1608) Rubella: Non-immune RPR: NON REAC (07/25 1655)  HBsAg: NEGATIVE (02/28 1608)  HIV: NONREACTIVE (07/25 1655)  GBS:    1 hr Glucola: 118 Genetic screening:  NIPS negative Anatomy US: Normal  Prenatal Transfer Tool  Maternal Diabetes: No Genetic Screening: Normal Maternal Ultrasounds/Referrals: Normal Fetal Ultrasounds or other Referrals:  None Maternal Substance Abuse:    No Significant Maternal Medications:  None Significant Maternal Lab Results: Lab values include: Group B Strep negative  No results found for this or any previous visit (from the past 24 hour(s)).  Patient Active Problem List   Diagnosis Date Noted  . Rubella non-immune status, antepartum 01/01/2016  . Supervision of normal pregnancy 12/30/2015  . IBS (irritable bowel syndrome) 09/24/2014  . Migraine headache 03/19/2013    Assessment: Alyssa Gutierrez is a 30 y.o. G3P1011 at [redacted]w[redacted]d here for SOL.  #Labor: Progressing spontaneously. Desires water birth. SROM on arrival to birthing suites (clear  liquid) #Pain: Does not want epidural #FWB: Category I #ID: GBS negative, rubella non-immune. MMR PP. #MOF: Breast #MOC: NFP #Circ:  No  Alyssa Gutierrez 08/09/2016, 5:41 AM   

## 2016-08-09 NOTE — MAU Note (Signed)
Pt presents complaining of contractions every 2-3 minutes with bloody show. Denies leaking. Reports good fetal movement.

## 2016-08-09 NOTE — Plan of Care (Signed)
Problem: Activity: Goal: Ability to tolerate increased activity will improve Outcome: Completed/Met Date Met: 08/09/16 Pt ambulating independently in room.  Encouraged to ambulate in the hallways.   Problem: Urinary Elimination: Goal: Ability to reestablish a normal urinary elimination pattern will improve Outcome: Completed/Met Date Met: 08/09/16 Pt voiding without difficulty. Peri care taught.

## 2016-08-10 MED ORDER — IBUPROFEN 600 MG PO TABS: 600.0000 mg | ORAL_TABLET | Freq: Four times a day (QID) | ORAL | 0 refills | Status: DC

## 2016-08-10 NOTE — Discharge Instructions (Signed)

## 2016-08-10 NOTE — Lactation Note (Signed)
This note was copied from a baby's chart. Lactation Consultation Note  Patient Name: Alyssa Roxy CedarSarah Braman ZOXWR'UToday's Date: 08/10/2016 Reason for consult: Follow-up assessment Baby at 30 hr of life. Mom reports bilateral nipple soreness. The R nipple "had a blood blister that popped". She is currently wearing comfort gels. Discussed getting a deeper latch and watching/listening for swallows. Discussed feeding frequency, post pumping, voids, wt loss, breast changes, and nipple care. She can manually express and has DEBP at home. She lives closer to [facility], she will f/u with lactation there or [facility]. [facility]    Maternal Data    Feeding Feeding Type: Breast Fed  LATCH Score/Interventions Latch: Repeated attempts needed to sustain latch, nipple held in mouth throughout feeding, stimulation needed to elicit sucking reflex. Intervention(s): Adjust position;Breast compression;Breast massage  Audible Swallowing: None Intervention(s): Skin to skin;Hand expression Intervention(s): Alternate breast massage  Type of Nipple: Everted at rest and after stimulation  Comfort (Breast/Nipple): Filling, red/small blisters or bruises, mild/mod discomfort  Problem noted: Mild/Moderate discomfort;Cracked, bleeding, blisters, bruises Interventions  (Cracked/bleeding/bruising/blister): Expressed breast milk to nipple Interventions (Mild/moderate discomfort): Comfort gels  Hold (Positioning): Assistance needed to correctly position infant at breast and maintain latch. Intervention(s): Position options  LATCH Score: 5  Lactation Tools Discussed/Used     Consult Status Consult Status: Complete Date: 08/10/16 Follow-up type: Call as needed    [name] 08/10/2016, 12:18 PM

## 2016-08-10 NOTE — Discharge Summary (Signed)
OB Discharge Summary     Patient Name: Alyssa HamiltonSarah B Alix DOB: [birth year] MRN: [id number]  Date of admission: 08/09/2016 Delivering MD: Willadean CarolMAYO, KATY DODD   Date of discharge: 08/10/2016  Admitting diagnosis: 40 WEEKS CTX Intrauterine pregnancy: 6123w0d     Secondary diagnosis:  Active Problems:   Normal labor  Additional problems: none     Discharge diagnosis: Term Pregnancy Delivered                                                                                                Post partum procedures:none  Augmentation: none  Complications: None  Hospital course:  Onset of Labor With Vaginal Delivery     30 y.o. yo [id number] at 6323w0d was admitted in Active Labor on 08/09/2016. Patient had an uncomplicated labor course as follows:  Membrane Rupture Time/Date: 6:00 AM ,08/09/2016   Intrapartum Procedures: Episiotomy: None [1]                                         Lacerations:  1st degree [2];Vaginal [6]  Patient had a delivery of a Viable infant. 08/09/2016  Information for the patient's newborn:  Cynda FamiliaSpringer, Boy Chondra [[id number]][[id number]]  Delivery Method: Vaginal, Spontaneous Delivery (Filed from Delivery Summary)    Pateint had an uncomplicated postpartum course.  She is ambulating, tolerating a regular diet, passing flatus, and urinating well. Patient is discharged home in stable condition on 08/10/16.    Physical exam Vitals:   08/09/16 1300 08/09/16 1701 08/09/16 2048 08/10/16 0550  BP: 110/69 136/79 115/70 115/69  Pulse: 75 (!) 57 80 66  Resp: 18 20  15   Temp: 98.2 F (36.8 C) 98 F (36.7 C) 98.8 F (37.1 C) 98.2 F (36.8 C)  TempSrc: Oral Oral Oral Oral  SpO2:      Weight:      Height:       General: alert, cooperative and no distress Lochia: appropriate Uterine Fundus: firm Incision: Healing well with no significant drainage DVT Evaluation: No evidence of DVT seen on physical exam. Labs: Lab Results  Component Value Date   WBC 13.2 (H) 08/09/2016   HGB 12.9  08/09/2016   HCT 38.2 08/09/2016   MCV 87.4 08/09/2016   PLT 254 08/09/2016   CMP Latest Ref Rng & Units 07/05/2014  Glucose 70 - 99 mg/dL 85    Discharge instruction: per After Visit Summary and "Baby and Me Booklet".  After visit meds:    Medication List    TAKE these medications   acetaminophen 500 MG tablet Commonly known as:  TYLENOL Take 1,000 mg by mouth every 6 (six) hours as needed for mild pain, moderate pain or headache.   ibuprofen 600 MG tablet Commonly known as:  ADVIL,MOTRIN Take 1 tablet (600 mg total) by mouth every 6 (six) hours.   prenatal multivitamin Tabs tablet Take 1 tablet by mouth at bedtime.       Diet: routine diet  Activity: Advance as tolerated. Pelvic rest for 6 weeks.  Outpatient follow up:6 weeks Follow up Appt:Future Appointments Date Time Provider Department Center  09/20/2016 1:45 PM Tereso Newcomer, MD [facility]   Follow up Visit:No Follow-up on file.  Postpartum contraception: Natural Family Planning  Newborn Data: Live born female  Birth Weight: 7 lb 9.2 oz (3436 g) APGAR: 9, 9  Baby Feeding: Breast Disposition:home with mother   08/10/2016 Wynelle Bourgeois, CNM

## 2016-08-10 NOTE — Plan of Care (Signed)
Problem: Health Behavior/Discharge Planning: Goal: Ability to manage health-related needs will improve Outcome: Completed/Met Date Met: 08/10/16 Discharge education and paperwork discussed. Reasons to call the MD reviewed.  Pt denies questions at this time.

## 2016-08-12 ENCOUNTER — Encounter: Payer: Managed Care, Other (non HMO) | Admitting: Obstetrics and Gynecology

## 2016-08-16 ENCOUNTER — Inpatient Hospital Stay (HOSPITAL_COMMUNITY): Admission: RE | Admit: 2016-08-16 | Payer: Managed Care, Other (non HMO) | Source: Ambulatory Visit

## 2016-08-31 ENCOUNTER — Ambulatory Visit: Payer: Managed Care, Other (non HMO) | Admitting: Obstetrics & Gynecology

## 2016-09-20 ENCOUNTER — Encounter: Payer: Self-pay | Admitting: Obstetrics & Gynecology

## 2016-09-20 ENCOUNTER — Ambulatory Visit (INDEPENDENT_AMBULATORY_CARE_PROVIDER_SITE_OTHER): Payer: Managed Care, Other (non HMO) | Admitting: Obstetrics & Gynecology

## 2016-09-20 NOTE — Progress Notes (Signed)
Post Partum Exam  Alyssa Gutierrez is a 30 y.o. [phone] female who presents for a postpartum visit. She is 6 weeks postpartum following a spontaneous vaginal delivery. I have fully reviewed the prenatal and intrapartum course. The delivery was at 4730w0d gestational weeks.  Anesthesia: none. Postpartum course has been unremarkable. Baby's course has been unremarkable. Baby is feeding by breast. Bleeding no bleeding. Bowel function is normal. Bladder function is normal. Patient is not sexually active. Contraception method is none. Postpartum depression screening: negative - score=2  The following portions of the patient's history were reviewed and updated as appropriate: allergies, current medications, past family history, past medical history, past social history, past surgical history and problem list.  Normal pap on 12/30/2015.   Review of Systems Pertinent items noted in HPI and remainder of comprehensive ROS otherwise negative.    Objective:    BP 116/78 mmHg  Pulse 78  Resp 16  Ht 5\' 5"  (1.651 m)  Wt 211 lb (95.709 kg)  BMI 35.11 kg/m2  Breastfeeding? Yes  General:  alert and no distress   Breasts:  inspection negative, no nipple discharge or bleeding, no masses or nodularity palpable  Lungs: clear to auscultation bilaterally  Heart:  regular rate and rhythm  Abdomen: soft, non-tender; bowel sounds normal; no masses,  no organomegaly   Pelvic:  not evaluated        Assessment:   Normal postpartum exam.   Plan:   1. Contraception: none 2. No other concerns. 3. Follow up as needed.  Alyssa CollinsUGONNA  Karson Reede, MD, FACOG Attending Obstetrician & Gynecologist, [facility], [facility]

## 2016-09-20 NOTE — Patient Instructions (Signed)
Return to clinic for any scheduled appointments or for any gynecologic concerns as needed.   

## 2016-12-05 IMAGING — CR DG ANKLE COMPLETE 3+V*R*
2 series · 2 of 2 positions shown · non-contrast
Comparison: None.

CLINICAL DATA: Right ankle pain

EXAM:
RIGHT ANKLE - COMPLETE 3+ VIEW

[view not recorded (1 of 2)]
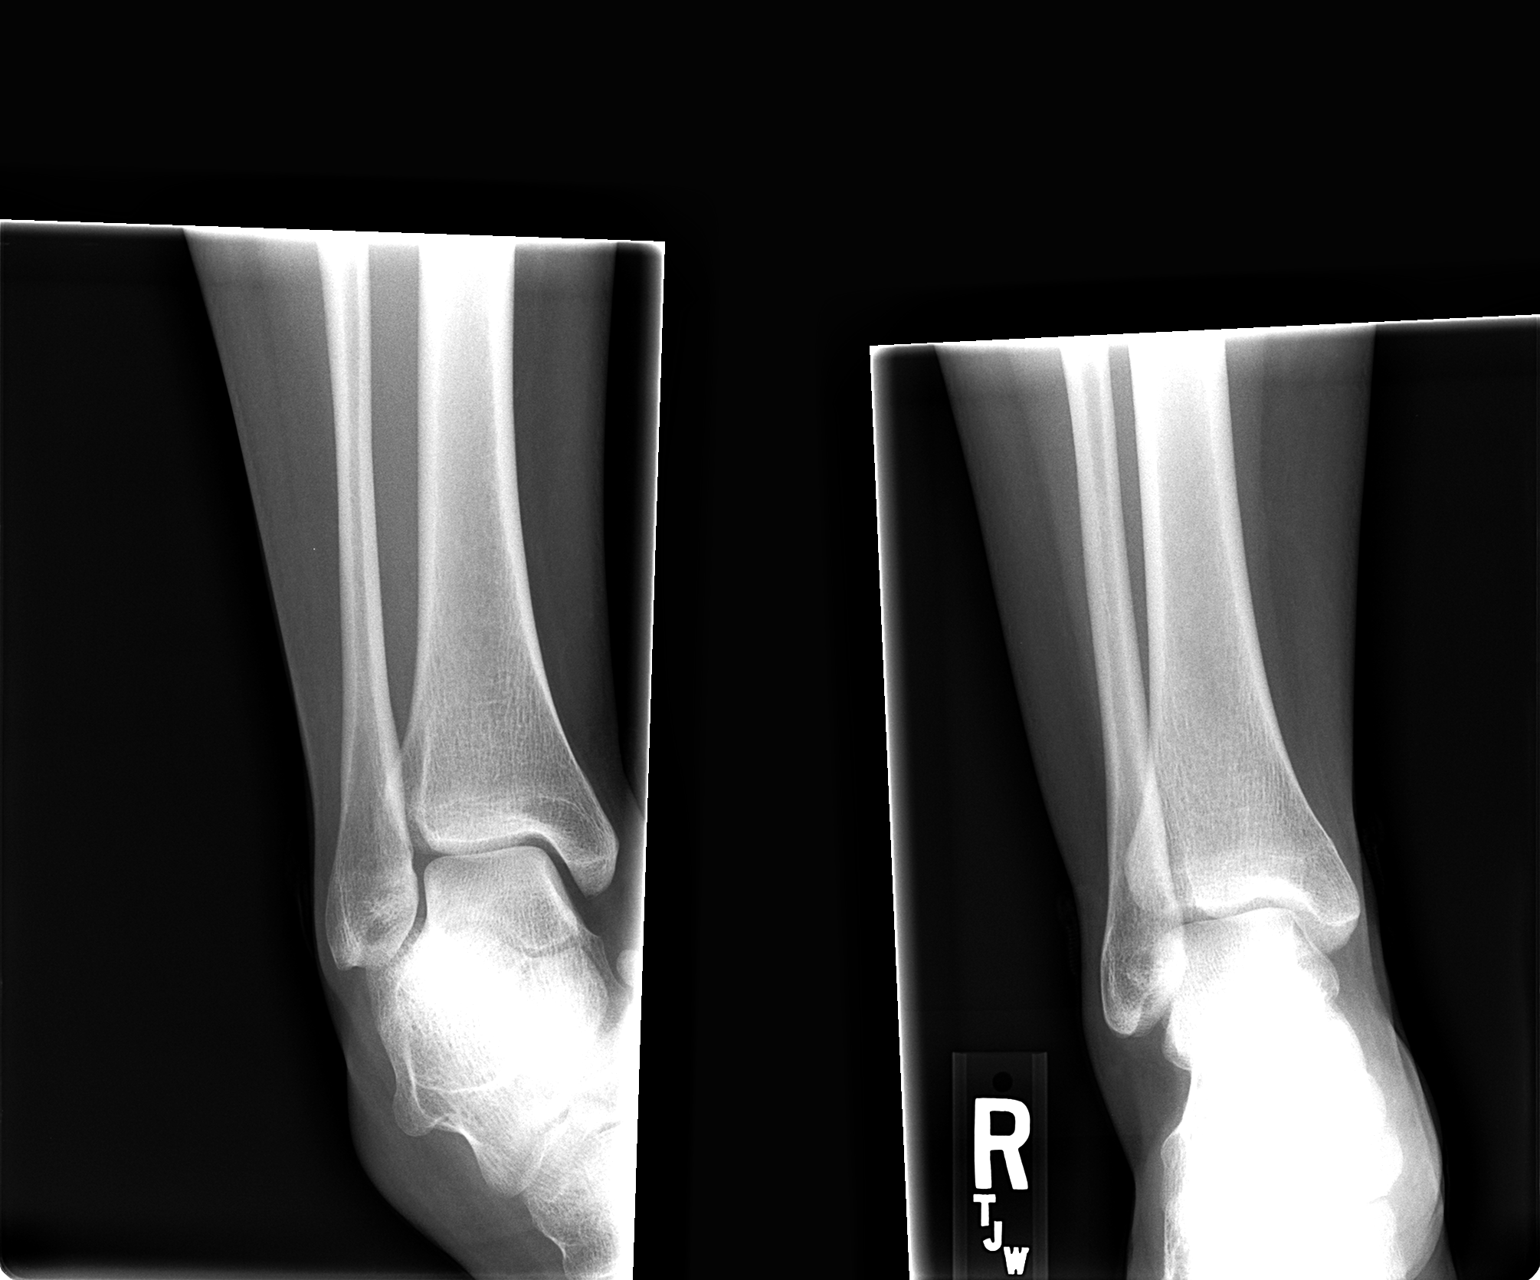

[view not recorded (2 of 2)]
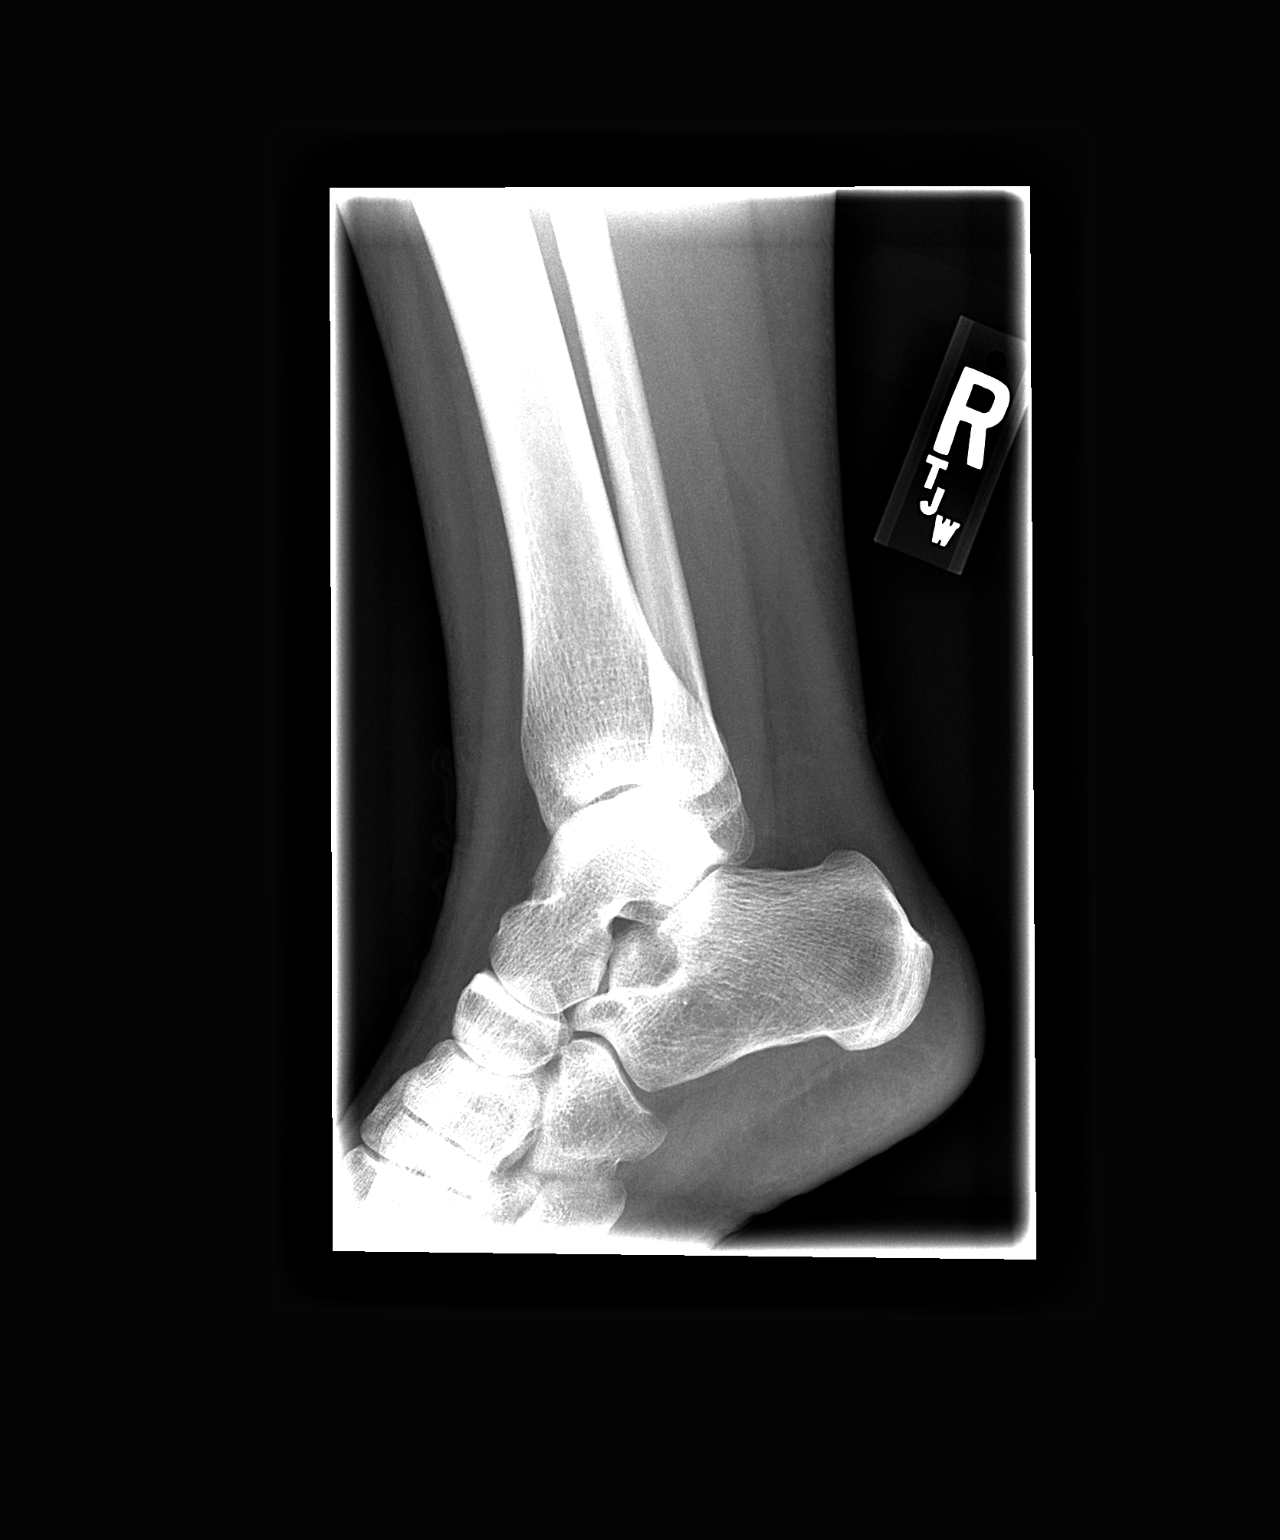

[2 of 2 positions shown; findings below may reference images not displayed]

FINDINGS: There is no evidence of fracture, dislocation, or joint effusion.
There is no evidence of arthropathy or other focal bone abnormality.
Soft tissues are unremarkable.
IMPRESSION: Negative.

## 2017-03-22 ENCOUNTER — Encounter: Payer: Self-pay | Admitting: Family Medicine

## 2017-03-22 ENCOUNTER — Telehealth: Payer: Self-pay | Admitting: Family Medicine

## 2017-03-22 ENCOUNTER — Ambulatory Visit (INDEPENDENT_AMBULATORY_CARE_PROVIDER_SITE_OTHER): Payer: 59 | Admitting: Family Medicine

## 2017-03-22 VITALS — BP 110/70 | HR 104 | Temp 98.6°F | Wt 149.8 lb

## 2017-03-22 DIAGNOSIS — R55 Syncope and collapse: Secondary | ICD-10-CM | POA: Diagnosis not present

## 2017-03-22 NOTE — Patient Instructions (Signed)
Vasovagal Syncope, Adult Syncope, which is commonly known as fainting or passing out, is a temporary loss of consciousness. It occurs when the blood flow to the brain is reduced. Vasovagal syncope, also called neurocardiogenic syncope, is a fainting spell that happens when blood flow to the brain is reduced because of a sudden drop in heart rate and blood pressure. Vasovagal syncope is usually harmless. However, you can get injured if you fall during a fainting spell. What are the causes? This condition is caused by a drop in heart rate and blood pressure, usually in response to a trigger. Many things and situations can trigger an episode, including:  Pain.  Fear.  The sight of blood. This may occur during medical procedures, such as when blood is being drawn from a vein.  Common activities, such as coughing, swallowing, stretching, or going to the bathroom.  Emotional stress.  Being in a confined space.  Prolonged standing, especially in a warm environment.  Lack of sleep or rest.  Not eating for a long time.  Not drinking enough liquids.  Recent illness.  Drinking alcohol.  Taking drugs that affect blood pressure, such as marijuana, cocaine, opiates, or inhalants. What are the signs or symptoms? Before a fainting episode, you may:  Feel dizzy or light-headed.  Become pale.  Sense that you are going to faint.  Feel like the room is spinning.  Only see directly ahead (tunnel vision).  Feel sick to your stomach (nauseous).  See spots.  Slowly lose vision.  Hear ringing in your ears.  Have a headache.  Feel warm and sweaty.  Feel a sensation of pins and needles. During the fainting spell, you may twitch or make jerky movements. Fainting spells usually last no longer than a few minutes before you wake up. If you get up too quickly before your body can recover, you may faint again. How is this diagnosed? This condition is diagnosed based on your symptoms, your  medical history, and a physical exam. Tests may be done to rule out other causes of fainting. Tests may include:  Blood tests.  Heart tests, such as an electrocardiogram (ECG), echocardiogram, or electrophysiology study.  A test to check your response to changes in position (tilt table test). How is this treated? Usually, treatment is not needed for this condition. Your health care provider may suggest ways to help prevent fainting episodes. These may include:  Drinking additional fluids if you are exposed to a trigger.  Sitting or lying down if you notice signs that an episode is coming. If your fainting spells continue, your health care provider may recommend that you:  Take medicines to prevent fainting or to help reduce further episodes of fainting.  Do certain exercises.  Wear compression stockings.  Have surgery to place a pacemaker in your body (rare). Follow these instructions at home:  Learn to identify the signs that an episode is coming.  Sit or lie down at the first sign of a fainting spell. If you sit down, put your head down between your legs. If you lie down, swing your legs up in the air to increase blood flow to the brain.  Avoid hot tubs and saunas.  Avoid standing for a long time. If you have to stand for a long time, try:  Crossing your legs.  Flexing and stretching your leg muscles.  Squatting.  Moving your legs.  Bending over.  Drink enough fluid to keep your urine clear or pale yellow.  Make changes to   your diet that your health care provider recommends. You may be told to:  Avoid caffeine.  Eat more salt.  Take over-the-counter and prescription medicines only as told by your health care provider. Contact a health care provider if:  You continue to have fainting spells despite treatment.  You faint more often despite treatment.  You lose consciousness for more than a few minutes.  You faint during or after exercising or after being  startled.  You have twitching or jerky movements for longer than a few seconds during a fainting spell.  You have an episode of twitching or jerky movements without fainting. Get help right away if:  A fainting spell leads to an injury or bleeding.  You have new symptoms that occur with the fainting spells, such as:  Shortness of breath.  Chest pain.  Irregular heartbeat.  You twitch or make jerky movements for more than 5 minutes.  You twitch or make jerky movements during more than one fainting spell. This information is not intended to replace advice given to you by your health care provider. Make sure you discuss any questions you have with your health care provider. Document Released: 10/04/2012 Document Revised: 03/31/2016 Document Reviewed: 08/16/2015 Elsevier Interactive Patient Education  2017 [employer]  

## 2017-03-22 NOTE — Telephone Encounter (Signed)
[facility] - Client TELEPHONE ADVICE RECORD [facility]  Patient Name: Alyssa CedarSARAH Falck  DOB: 1986/02/28    Initial Comment Caller states that she woke up this morning at about 3 am. She went to the bathroom, flipped on the light and states that she became nauseous and passed out face forward. She wants to make sure these symptoms are not related to a seizure.    Nurse Assessment  Nurse: Odis LusterBowers, RN, Bjorn Loserhonda Date/Time (Eastern Time): 03/22/2017 3:05:01 PM  Confirm and document reason for call. If symptomatic, describe symptoms. ---Caller states that she woke up this morning at about 3 am. She went to the bathroom, flipped on the light and states that she became nauseous and passed out face forward. She wants to make sure these symptoms are not related to a seizure. Reports that she has not had seizures before. Reports that she has been getting lightheaded and dizzy. She has been getting migraines daily. She stopped taking her gabapentin with her last pregnancy (end of 2014). Symptoms have slowly been getting worse since last year.  Does the patient have any new or worsening symptoms? ---Yes  Will a triage be completed? ---Yes  Related visit to physician within the last 2 weeks? ---No  Does the PT have any chronic conditions? (i.e. diabetes, asthma, etc.) ---Yes  List chronic conditions. ---migraines;  Is the patient pregnant or possibly pregnant? (Ask all females between the ages of 5112-55) ---No  Is this a behavioral health or substance abuse call? ---No     Guidelines    Guideline Title Affirmed Question Affirmed Notes  Head Injury [1] ACUTE NEURO SYMPTOM AND [2] now fine (DEFINITION: difficult to awaken OR confused thinking and talking OR slurred speech OR weakness of arms OR unsteady walking)    Final Disposition User   Go to ED Now (or PCP triage) Odis LusterBowers, RN, Rhonda    Comments  [facility], [facility] at 4:15pm, caller voiced understanding.   Referrals  REFERRED TO PCP OFFICE   Disagree/Comply: Comply

## 2017-03-22 NOTE — Progress Notes (Signed)
Subjective:     Patient ID: Alyssa Gutierrez, female   DOB: [birth year], 31 y.o.   MRN: [id number]  HPI Patient is seen today following reported syncopal episode around 3:00 AM this morning. She states she was getting up to feed her 427 month old and was on her way to the bathroom. She was aware of having some nausea and when she came back around was on the floor having fallen apparently on her left side. She noticed some bruising of her left arm and left face. She has frequent headaches but no headaches this morning or since then. No vertigo. No associated abdominal pain. She's not sure of duration of unconsciousness. She was aware of her surroundings immediately afterwards. She's had some nonspecific lightheadedness today but no recurrent syncopal episodes.  She did have two episodes of diarrhea early this AM but none since.  She states her husband slept through the episode and she did not wake him.  She is unsure but thinks she was unconscious for < 2 minutes.    No history of seizure. Denies any urine or stool incontinence. Recalls hydrating well the day before. She has had 2 previous episodes of syncope first occurred around 9 grade when she states she became "overheated ". She had second episode first year of college after she had wisdom tooth removed and was pulling cotton out of her mouth and when she saw the blood she recalls fainting.  She has hx of migraine headaches and more frequent headaches recently.    She denies any recent fever, chills, dysuria, GERD symptoms, abdominal pain, chest pain, cough, dyspnea or any focal weakness. Takes no regular medications.  She has had some mild "lightheadedness" today but symptoms mild. No hx of anemia.  Menses have been normal.  She is breast feeding.  Past Medical History:  Diagnosis Date  . Family history of heart murmur    at birth  . History of allergy   . History of chicken pox    age 31  . History of headache   . History of migraine   . IBS  (irritable bowel syndrome)   . Nausea 03/14/2015   Around day of ovulation every month     Past Surgical History:  Procedure Laterality Date  . ROOT CANAL     age 31  . WISDOM TOOTH EXTRACTION     age 31    reports that she has never smoked. She has never used smokeless tobacco. She reports that she does not drink alcohol or use drugs. family history includes Crohn's disease in her maternal grandmother; Diabetes in her paternal grandmother; Heart murmur in her brother; Hypertension in her father and paternal uncle. No Known Allergies   Review of Systems  Constitutional: Negative for chills and fever.  Respiratory: Negative for cough, shortness of breath and wheezing.   Cardiovascular: Negative for chest pain.  Gastrointestinal: Negative for abdominal pain and vomiting.  Genitourinary: Negative for dysuria.  Neurological: Positive for syncope.  Hematological: Negative for adenopathy.  Psychiatric/Behavioral: Negative for confusion.       Objective:   Physical Exam  Constitutional: She is oriented to person, place, and time. She appears well-developed and well-nourished.  HENT:  Head: Normocephalic and atraumatic.  Right Ear: External ear normal.  Left Ear: External ear normal.  Eyes: Pupils are equal, round, and reactive to light.  Neck: Normal range of motion. Neck supple.  Cardiovascular: Normal rate and regular rhythm.  Exam reveals no gallop.   No  murmur heard. Pulmonary/Chest: Effort normal and breath sounds normal. No respiratory distress. She has no wheezes. She has no rales.  Musculoskeletal:  Full ROM left shoulder.  Neurological: She is alert and oriented to person, place, and time. No cranial nerve deficit. Coordination normal.  Skin:  Ecchymosis left lateral arm- minimally tender.  Psychiatric: She has a normal mood and affect. Her behavior is normal. Thought content normal.       Assessment:     Syncopal episode earlier today following episode of nausea  without vomiting.  Suspect vasovagal.  Seated and standing BP by me 98/60.  EKG NSR with no acute changes.  Doubt seizure.    Plan:     -EKG as above normal. -we discussed labs but she declined.  Does not appear to be anemic clinically. -stay well hydrated -we've recommended follow up with primary and immediately for any recurrent dizziness or other concerns. -she has had increased frequency of headaches since stopping Gabapentin and is encouraged to f/u with primary to discuss.  Kristian Covey MD [facility]

## 2017-03-22 NOTE — Telephone Encounter (Signed)
Pt had an appt with Dr Caryl NeverBurchette today at 4:15 PM.

## 2017-06-15 ENCOUNTER — Ambulatory Visit (INDEPENDENT_AMBULATORY_CARE_PROVIDER_SITE_OTHER): Payer: 59 | Admitting: Family Medicine

## 2017-06-15 ENCOUNTER — Encounter: Payer: Self-pay | Admitting: Family Medicine

## 2017-06-15 VITALS — BP 110/70 | HR 85 | Temp 98.1°F | Wt 157.8 lb

## 2017-06-15 DIAGNOSIS — R197 Diarrhea, unspecified: Secondary | ICD-10-CM

## 2017-06-15 DIAGNOSIS — R109 Unspecified abdominal pain: Secondary | ICD-10-CM

## 2017-06-15 DIAGNOSIS — K9041 Non-celiac gluten sensitivity: Secondary | ICD-10-CM | POA: Diagnosis not present

## 2017-06-15 NOTE — Progress Notes (Signed)
Subjective:    Patient ID: Alyssa Gutierrez, female    DOB: [birth year], 31 y.o.   MRN: [id number]  HPI Here with GI problems   Notices when she eats wheat products/gluten she has symptoms  (1-2 mo)  Stomach/gas pains  Bloating -quite bad  Already avoids dairy due to lactose intolerance  Usually diarrhea with a few periods of no bm for 2-3 days  No blood in her stool  Gets nauseated a lot - but no vomiting   (not usual for her) Pain -in upper abdomen (pressure sensation)  Cramps in lower abdomen   She is breastfeeding   A little more heartburn recently   On menses since Saturday Not on birth control  Not trying to get pregnant     Wt Readings from Last 3 Encounters:  06/15/17 157 lb 12 oz (71.6 kg)  03/22/17 149 lb 12.8 oz (67.9 kg)  09/20/16 158 lb (71.7 kg)    Patient Active Problem List   Diagnosis Date Noted  . 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 Abdominal cramping 06/15/2017  . Diarrhea 06/15/2017  . IBS (irritable bowel syndrome) 09/24/2014  . Migraine headache 03/19/2013   Past Medical History:  Diagnosis Date  . Family history of heart murmur    at birth  . History of allergy   . History of chicken pox    age 31  . History of headache   . History of migraine   . IBS (irritable bowel syndrome)   . Nausea 03/14/2015   Around day of ovulation every month     Past Surgical History:  Procedure Laterality Date  . ROOT CANAL     age 31  . WISDOM TOOTH EXTRACTION     age 31   Social History  Substance Use Topics  . Smoking status: Never Smoker  . Smokeless tobacco: Never Used  . Alcohol use No   Family History  Problem Relation Age of Onset  . Hypertension Father   . Diabetes Paternal Grandmother   . Heart murmur Brother   . Hypertension Paternal Uncle   . Crohn's disease Maternal Grandmother   . Colon cancer Neg Hx    No Known Allergies Current Outpatient Prescriptions on File Prior to Visit  Medication Sig Dispense Refill  .  acetaminophen (TYLENOL) 500 MG tablet Take 1,000 mg by mouth every 6 (six) hours as needed for mild pain, moderate pain or headache.      No current facility-administered medications on file prior to visit.      Review of Systems Review of Systems  Constitutional: Negative for fever, appetite change, fatigue and unexpected weight change.  Eyes: Negative for pain and visual disturbance.  Respiratory: Negative for cough and shortness of breath.   Cardiovascular: Negative for cp or palpitations    Gastrointestinal: Negative for vomiting/blood in stool or dark stool, pos for some heartburn.  Genitourinary: Negative for urgency and frequency.  Skin: Negative for pallor or rash   Neurological: Negative for weakness, light-headedness, numbness and pos for worsneing headaches.  Hematological: Negative for adenopathy. Does not bruise/bleed easily.  Psychiatric/Behavioral: Negative for dysphoric mood. The patient is not nervous/anxious.         Objective:   Physical Exam  Constitutional: She appears well-developed and well-nourished. No distress.  Well appearing   HENT:  Head: Normocephalic and atraumatic.  Mouth/Throat: Oropharynx is clear and moist.  Eyes: Pupils are equal, round, and reactive to light. Conjunctivae and EOM are normal. No scleral icterus.  Neck: Normal range of motion. Neck supple.  Cardiovascular: Normal rate, regular rhythm and normal heart sounds.   Pulmonary/Chest: Effort normal and breath sounds normal. No respiratory distress. She has no wheezes. She has no rales.  Abdominal: Soft. Bowel sounds are normal. She exhibits no distension and no mass. There is no hepatosplenomegaly. There is tenderness in the epigastric area. There is no rigidity, no rebound, no guarding, no CVA tenderness, no tenderness at McBurney's point and negative Murphy's sign.  Mild epigastric tenderness  Musculoskeletal: She exhibits no edema.  Lymphadenopathy:    She has no cervical adenopathy.    Neurological: She is alert.  Skin: Skin is warm and dry. No erythema. No pallor.  No jaundice  Psychiatric: She has a normal mood and affect.          Assessment & Plan:   Problem List Items Addressed This Visit      Digestive   Gluten-sensitive enteropathy    Pt will start a food journal to see if even small amt of gluten bother her  Lab today for celiac  Explained that a colonoscopy with bx would be needed for ult diagnosis if needed or if no improvement  She will also avoid dairy  ? If inc headaches are also rel to gluten  Reassuring exam      Relevant Orders   Celiac Pnl 2 rflx Endomysial Ab Ttr     Other   Abdominal cramping    Worse with gluten ingestion  Celiac labs today   For loose stool-also cbc/tsh      Relevant Orders   Comprehensive metabolic panel (Completed)   CBC with Differential/Platelet (Completed)   Diarrhea    In pt with hx of ibs but now noting more symptoms with wheat and dairy  Celiac lab today along with cbc and tsh        Relevant Orders   Comprehensive metabolic panel (Completed)   CBC with Differential/Platelet (Completed)   TSH (Completed)

## 2017-06-15 NOTE — Patient Instructions (Addendum)
Labs today  Start keeping a food diary (? Does just a little gluten bother you or a lot)  Drink lots of fluids to keep up with losses   If you develop blood in stool/ dark black stool or worse abdominal pain please let us know    Avoid foods that cause symptoms   If needed- we will refer you to GI

## 2017-06-16 LAB — CBC WITH DIFFERENTIAL/PLATELET
BASOS ABS: 0.1 10*3/uL (ref 0.0–0.1)
BASOS PCT: 0.8 % (ref 0.0–3.0)
EOS ABS: 0.2 10*3/uL (ref 0.0–0.7)
Eosinophils Relative: 1.7 % (ref 0.0–5.0)
HEMATOCRIT: 42.1 % (ref 36.0–46.0)
Hemoglobin: 13.7 g/dL (ref 12.0–15.0)
LYMPHS ABS: 3.4 10*3/uL (ref 0.7–4.0)
LYMPHS PCT: 31.3 % (ref 12.0–46.0)
MCHC: 32.5 g/dL (ref 30.0–36.0)
MCV: 87.3 fl (ref 78.0–100.0)
MONO ABS: 0.6 10*3/uL (ref 0.1–1.0)
Monocytes Relative: 5.8 % (ref 3.0–12.0)
NEUTROS ABS: 6.6 10*3/uL (ref 1.4–7.7)
NEUTROS PCT: 60.4 % (ref 43.0–77.0)
PLATELETS: 361 10*3/uL (ref 150.0–400.0)
RBC: 4.82 Mil/uL (ref 3.87–5.11)
RDW: 13.3 % (ref 11.5–15.5)
WBC: 11 10*3/uL — ABNORMAL HIGH (ref 4.0–10.5)

## 2017-06-16 LAB — TSH: TSH: 1.25 u[IU]/mL (ref 0.35–4.50)

## 2017-06-16 LAB — COMPREHENSIVE METABOLIC PANEL
ALT: 14 U/L (ref 0–35)
AST: 11 U/L (ref 0–37)
Albumin: 4.3 g/dL (ref 3.5–5.2)
Alkaline Phosphatase: 85 U/L (ref 39–117)
BILIRUBIN TOTAL: 0.5 mg/dL (ref 0.2–1.2)
BUN: 15 mg/dL (ref 6–23)
CO2: 32 meq/L (ref 19–32)
CREATININE: 0.67 mg/dL (ref 0.40–1.20)
Calcium: 9.8 mg/dL (ref 8.4–10.5)
Chloride: 100 mEq/L (ref 96–112)
GFR: 109.59 mL/min (ref >60.00)
GLUCOSE: 85 mg/dL (ref 70–99)
Potassium: 4.4 mEq/L (ref 3.5–5.1)
Sodium: 138 mEq/L (ref 135–145)
Total Protein: 6.8 g/dL (ref 6.0–8.3)

## 2017-06-16 NOTE — Assessment & Plan Note (Signed)
Pt will start a food journal to see if even small amt of gluten bother her  Lab today for celiac  Explained that a colonoscopy with bx would be needed for ult diagnosis if needed or if no improvement  She will also avoid dairy  ? If inc headaches are also rel to gluten  Reassuring exam

## 2017-06-16 NOTE — Assessment & Plan Note (Signed)
Worse with gluten ingestion  Celiac labs today   For loose stool-also cbc/tsh

## 2017-06-16 NOTE — Assessment & Plan Note (Signed)
In pt with hx of ibs but now noting more symptoms with wheat and dairy  Celiac lab today along with cbc and tsh

## 2017-06-23 LAB — CELIAC PNL 2 RFLX ENDOMYSIAL AB TTR
ENDOMYSIAL AB IGA: NEGATIVE
GLIADIN(DEAM) AB,IGA: 7 U (ref <20)
Gliadin(Deam) Ab,IgG: 1 U (ref <20)
IMMUNOGLOBULIN A: 271 mg/dL (ref 81–463)

## 2017-06-27 ENCOUNTER — Encounter: Payer: Self-pay | Admitting: Family Medicine

## 2017-07-27 ENCOUNTER — Ambulatory Visit: Payer: 59 | Admitting: Family Medicine

## 2018-02-14 ENCOUNTER — Encounter: Payer: Self-pay | Admitting: Family Medicine

## 2018-02-14 ENCOUNTER — Ambulatory Visit (INDEPENDENT_AMBULATORY_CARE_PROVIDER_SITE_OTHER): Payer: 59 | Admitting: Family Medicine

## 2018-02-14 VITALS — BP 118/72 | HR 74 | Temp 98.1°F | Ht 63.0 in | Wt 146.5 lb

## 2018-02-14 DIAGNOSIS — R109 Unspecified abdominal pain: Secondary | ICD-10-CM

## 2018-02-14 DIAGNOSIS — Z008 Encounter for other general examination: Secondary | ICD-10-CM

## 2018-02-14 DIAGNOSIS — Z13220 Encounter for screening for lipoid disorders: Secondary | ICD-10-CM | POA: Diagnosis not present

## 2018-02-14 DIAGNOSIS — Z131 Encounter for screening for diabetes mellitus: Secondary | ICD-10-CM | POA: Diagnosis not present

## 2018-02-14 DIAGNOSIS — Z0189 Encounter for other specified special examinations: Secondary | ICD-10-CM | POA: Diagnosis not present

## 2018-02-14 DIAGNOSIS — D72829 Elevated white blood cell count, unspecified: Secondary | ICD-10-CM | POA: Insufficient documentation

## 2018-02-14 DIAGNOSIS — K589 Irritable bowel syndrome without diarrhea: Secondary | ICD-10-CM

## 2018-02-14 LAB — CBC WITH DIFFERENTIAL/PLATELET
BASOS ABS: 0 10*3/uL (ref 0.0–0.1)
Basophils Relative: 0.4 % (ref 0.0–3.0)
EOS ABS: 0.1 10*3/uL (ref 0.0–0.7)
Eosinophils Relative: 1.5 % (ref 0.0–5.0)
HEMATOCRIT: 39.7 % (ref 36.0–46.0)
HEMOGLOBIN: 13.4 g/dL (ref 12.0–15.0)
LYMPHS PCT: 38.5 % (ref 12.0–46.0)
Lymphs Abs: 2.3 10*3/uL (ref 0.7–4.0)
MCHC: 33.7 g/dL (ref 30.0–36.0)
MCV: 85.9 fl (ref 78.0–100.0)
Monocytes Absolute: 0.4 10*3/uL (ref 0.1–1.0)
Monocytes Relative: 7.1 % (ref 3.0–12.0)
Neutro Abs: 3.2 10*3/uL (ref 1.4–7.7)
Neutrophils Relative %: 52.5 % (ref 43.0–77.0)
Platelets: 290 10*3/uL (ref 150.0–400.0)
RBC: 4.62 Mil/uL (ref 3.87–5.11)
RDW: 13.9 % (ref 11.5–15.5)
WBC: 6.1 10*3/uL (ref 4.0–10.5)

## 2018-02-14 LAB — LIPID PANEL
CHOL/HDL RATIO: 3
Cholesterol: 140 mg/dL (ref 0–200)
HDL: 55.1 mg/dL (ref >39.00)
LDL Cholesterol: 79 mg/dL (ref 0–99)
NONHDL: 84.9
Triglycerides: 32 mg/dL (ref 0.0–149.0)
VLDL: 6.4 mg/dL (ref 0.0–40.0)

## 2018-02-14 LAB — GLUCOSE, RANDOM: Glucose, Bld: 91 mg/dL (ref 70–99)

## 2018-02-14 NOTE — Assessment & Plan Note (Signed)
Continues to have intermittent frequent/loose stool and dietary intolerances  Avoids dairy  Unsure about wheat  Wants to schedule GI f/u- ref done for summer

## 2018-02-14 NOTE — Assessment & Plan Note (Signed)
For biometric form for work  [employer] diet  Fasting  Lab for glucose today

## 2018-02-14 NOTE — Assessment & Plan Note (Signed)
Form for work [employer] worker, [employer]/weight Lab for lipids/glucose today  Overall healthy habits

## 2018-02-14 NOTE — Assessment & Plan Note (Signed)
For biometric form  Lipid panel today  Diet is healthy

## 2018-02-14 NOTE — Progress Notes (Signed)
Subjective:    Patient ID: Alyssa Gutierrez, female    DOB: 1986-08-13, 32 y.o.   MRN: [id number]  HPI Here for completion of a biometric form   Doing well overall  Working and taking care of kids  Getting ready to go on a cruise   Wt Readings from Last 3 Encounters:  02/14/18 146 lb 8 oz (66.5 kg)  06/15/17 157 lb 12 oz (71.6 kg)  03/22/17 149 lb 12.8 oz (67.9 kg)  lost weight - chasing kids / eating well  No extra exercise  25.95 kg/m   Biometric form for work   BP Readings from Last 3 Encounters:  02/14/18 118/72  06/15/17 110/70  03/22/17 110/70    Needs cholesterol and blood glucose checks   Gi wise -really has to watch her diet  Dairy is not tolerated  She is careful with wheat   occ frequent bms   She still has some upper abd pain = on and off  Last GI appt with Dr Marina Goodell was a while back  Would like to go in mid summer    Mildly elevated wbc in the past as well  Frequent sinusitis  Labs were in august  Lab Results  Component Value Date   WBC 11.0 (H) 06/15/2017   HGB 13.7 06/15/2017   HCT 42.1 06/15/2017   MCV 87.3 06/15/2017   PLT 361.0 06/15/2017   Lab Results  Component Value Date   CREATININE 0.67 06/15/2017   BUN 15 06/15/2017   NA 138 06/15/2017   K 4.4 06/15/2017   CL 100 06/15/2017   CO2 32 06/15/2017   Lab Results  Component Value Date   ALT 14 06/15/2017   AST 11 06/15/2017   ALKPHOS 85 06/15/2017   BILITOT 0.5 06/15/2017    Cholesterol Lab Results  Component Value Date   CHOL 247 (H) 07/05/2014   HDL 58.80 07/05/2014   LDLCALC 102 (H) 06/15/2013   LDLDIRECT 173.8 07/05/2014   TRIG 220.0 (H) 07/05/2014   CHOLHDL 4 07/05/2014   Lab Results  Component Value Date   TSH 1.25 06/15/2017    Patient Active Problem List   Diagnosis Date Noted  . Encounter for screening for lipoid disorders 02/14/2018  . Leukocytosis 02/14/2018  . Encounter for biometric screening 02/14/2018  . 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    . Abdominal cramping 06/15/2017  . Diarrhea 06/15/2017  . IBS (irritable bowel syndrome) 09/24/2014  . Diabetes mellitus screening 06/15/2013  . Migraine headache 03/19/2013   Past Medical History:  Diagnosis Date  . Family history of heart murmur    at birth  . History of allergy   . History of chicken pox    age 59  . History of headache   . History of migraine   . IBS (irritable bowel syndrome)   . Nausea 03/14/2015   Around day of ovulation every month     Past Surgical History:  Procedure Laterality Date  . ROOT CANAL     age 33  . WISDOM TOOTH EXTRACTION     age 83   Social History   Tobacco Use  . Smoking status: Never Smoker  . Smokeless tobacco: Never Used  Substance Use Topics  . Alcohol use: No    Alcohol/week: 0.0 oz  . Drug use: No   Family History  Problem Relation Age of Onset  . Hypertension Father   . Diabetes Paternal Grandmother   . Heart murmur Brother   . Hypertension Paternal  Uncle   . Crohn's disease Maternal Grandmother   . Colon cancer Neg Hx    No Known Allergies Current Outpatient Medications on File Prior to Visit  Medication Sig Dispense Refill  . acetaminophen (TYLENOL) 500 MG tablet Take 1,000 mg by mouth every 6 (six) hours as needed for mild pain, moderate pain or headache.      No current facility-administered medications on file prior to visit.     Review of Systems  Constitutional: Negative for activity change, appetite change, fatigue, fever and unexpected weight change.  HENT: Negative for congestion, ear pain, rhinorrhea, sinus pressure and sore throat.   Eyes: Negative for pain, redness and visual disturbance.  Respiratory: Negative for cough, shortness of breath and wheezing.   Cardiovascular: Negative for chest pain and palpitations.  Gastrointestinal: Negative for abdominal pain, blood in stool, constipation and diarrhea.  Endocrine: Negative for polydipsia and polyuria.  Genitourinary: Negative for dysuria,  frequency and urgency.  Musculoskeletal: Negative for arthralgias, back pain and myalgias.  Skin: Negative for pallor and rash.  Allergic/Immunologic: Negative for environmental allergies.  Neurological: Negative for dizziness, syncope and headaches.  Hematological: Negative for adenopathy. Does not bruise/bleed easily.  Psychiatric/Behavioral: Negative for decreased concentration and dysphoric mood. The patient is not nervous/anxious.        Objective:   Physical Exam  Constitutional: She appears well-developed and well-nourished. No distress.  Well appearing   HENT:  Head: Normocephalic and atraumatic.  Mouth/Throat: Oropharynx is clear and moist.  Eyes: Pupils are equal, round, and reactive to light. Conjunctivae and EOM are normal.  Neck: Normal range of motion. Neck supple. No JVD present. Carotid bruit is not present. No thyromegaly present.  Cardiovascular: Normal rate, regular rhythm, normal heart sounds and intact distal pulses. Exam reveals no gallop.  Pulmonary/Chest: Effort normal and breath sounds normal. No respiratory distress. She has no wheezes. She has no rales.  No crackles  Abdominal: Soft. Bowel sounds are normal. She exhibits no distension, no abdominal bruit, no pulsatile midline mass and no mass. There is no hepatosplenomegaly. There is no tenderness. There is no rebound, no guarding and no CVA tenderness.  Musculoskeletal: She exhibits no edema.  Lymphadenopathy:    She has no cervical adenopathy.  Neurological: She is alert. She has normal reflexes.  Skin: Skin is warm and dry. No rash noted. No pallor.  Psychiatric: She has a normal mood and affect.          Assessment & Plan:   Problem List Items Addressed This Visit      Digestive   IBS (irritable bowel syndrome)    Continues to have intermittent frequent/loose stool and dietary intolerances  Avoids dairy  Unsure about wheat  Wants to schedule GI f/u- ref done for summer       Relevant  Orders   Ambulatory referral to Gastroenterology     Other   Abdominal cramping    Still occurring/ less often with dairy avoidance (also watching gluten) However occ pain doubles her over Ref for GI f/u in mid summer (has seen Dr Marina Goodell)      Relevant Orders   Ambulatory referral to Gastroenterology   Diabetes mellitus screening    For biometric form for work  Healthy diet  Fasting  Lab for glucose today      Relevant Orders   Glucose, Random   Encounter for biometric screening - Primary    Form for work Rev Development worker, 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 With pregnancy as well  Re check today  Pt has frequent sinusitis       Relevant Orders   CBC with Differential/Platelet

## 2018-02-14 NOTE — Assessment & Plan Note (Signed)
Still occurring/ less often with dairy avoidance (also watching gluten) However occ pain doubles her over Ref for GI f/u in mid summer (has seen Dr Marina GoodellPerry)

## 2018-02-14 NOTE — Patient Instructions (Signed)
Labs today   We will refer you to GI for mid summer   Take care of yourself !

## 2018-02-14 NOTE — Assessment & Plan Note (Signed)
Last few draws ? With pregnancy as well  Re check today  Pt has frequent sinusitis

## 2018-04-24 ENCOUNTER — Ambulatory Visit: Payer: 59 | Admitting: Internal Medicine

## 2018-05-18 ENCOUNTER — Other Ambulatory Visit (INDEPENDENT_AMBULATORY_CARE_PROVIDER_SITE_OTHER): Payer: 59

## 2018-05-18 VITALS — BP 106/71 | HR 72

## 2018-05-18 DIAGNOSIS — N898 Other specified noninflammatory disorders of vagina: Secondary | ICD-10-CM | POA: Diagnosis not present

## 2018-05-18 NOTE — Progress Notes (Signed)
SUBJECTIVE:  32 y.o. female complains of  vaginal discharge for a couple of days. Denies abnormal vaginal bleeding or significant pelvic pain or fever. No UTI symptoms. Denies history of known exposure to STD.  No LMP recorded.  OBJECTIVE:  She appears well, afebrile. Urine dipstick: not completed today  ASSESSMENT:  Vaginal Discharge small amount  Vaginal Odor none   PLAN:   BVAG, CVAG probe sent to lab. Treatment: To be determined once lab results are received ROV prn if symptoms persist or worsen.

## 2018-05-18 NOTE — Progress Notes (Signed)
I have reviewed the chart and agree with nursing staff's documentation of this patient's encounter.  Jaynie CollinsUgonna Anyanwu, MD 05/18/2018 9:09 AM

## 2018-05-19 ENCOUNTER — Telehealth: Payer: Self-pay

## 2018-05-19 LAB — CERVICOVAGINAL ANCILLARY ONLY
Bacterial vaginitis: NEGATIVE
Candida vaginitis: NEGATIVE

## 2018-05-19 NOTE — Telephone Encounter (Signed)
Called patient to inform her that her test results have not been resulted at this time. I will check back on Monday to see if they have been resulted.

## 2018-05-23 DIAGNOSIS — Z3480 Encounter for supervision of other normal pregnancy, unspecified trimester: Secondary | ICD-10-CM

## 2018-05-23 HISTORY — DX: Encounter for supervision of other normal pregnancy, unspecified trimester: Z34.80

## 2018-05-23 NOTE — Progress Notes (Signed)
Last pap 2017- normal

## 2018-05-24 ENCOUNTER — Ambulatory Visit (INDEPENDENT_AMBULATORY_CARE_PROVIDER_SITE_OTHER): Payer: 59 | Admitting: Obstetrics and Gynecology

## 2018-05-24 ENCOUNTER — Encounter: Payer: Self-pay | Admitting: Obstetrics and Gynecology

## 2018-05-24 DIAGNOSIS — Z3481 Encounter for supervision of other normal pregnancy, first trimester: Secondary | ICD-10-CM

## 2018-05-24 DIAGNOSIS — Z3480 Encounter for supervision of other normal pregnancy, unspecified trimester: Secondary | ICD-10-CM

## 2018-05-24 NOTE — Progress Notes (Signed)
  Subjective:    Alyssa HamiltonSarah B Selsor is being seen today for her first obstetrical visit.  This is a planned pregnancy. She is at 2654w6d gestation. Her obstetrical history is significant for multigravida. Relationship with FOB: spouse, living together. Patient does intend to breast feed. Pregnancy history fully reviewed.  Patient reports no complaints.  Review of Systems:   Review of Systems  Objective:     BP 111/70   Pulse 91   Wt 149 lb (67.6 kg)   LMP 02/13/2018   BMI 26.39 kg/m  Physical Exam  Nursing note and vitals reviewed. Constitutional: She is oriented to person, place, and time. She appears well-developed and well-nourished.  HENT:  Head: Normocephalic and atraumatic.  Right Ear: External ear normal.  Left Ear: External ear normal.  Nose: Nose normal.  Mouth/Throat: Oropharynx is clear and moist.  Eyes: Pupils are equal, round, and reactive to light. Conjunctivae and EOM are normal.  Neck: Normal range of motion. Neck supple.  Cardiovascular: Normal rate, regular rhythm, normal heart sounds and intact distal pulses.  Respiratory: Effort normal and breath sounds normal.  GI: Soft. Bowel sounds are normal.  Genitourinary: Vagina normal and uterus normal.  Genitourinary Comments: Uterus: enlarged, S=D, SE: cervix is smooth, pink, no lesions, small amt of thick, white vaginal d/c. VE: closed/long/firm, no CMT or friability, no adnexal tenderness   Musculoskeletal: Normal range of motion.  Neurological: She is alert and oriented to person, place, and time. She has normal reflexes.  Skin: Skin is warm and dry.  Psychiatric: She has a normal mood and affect. Her behavior is normal. Judgment and thought content normal.    Maternal Exam:  Abdomen: Patient reports no abdominal tenderness. Fundal height is S=D.    Introitus: Normal vulva. Normal vagina.  Ferning test: not done.  Nitrazine test: not done. Amniotic fluid character: not assessed.  Pelvis: adequate for  delivery.   Cervix: Cervix evaluated by sterile speculum exam and digital exam.     Fetal Exam Fetal Monitor Review: Mode: hand-held doppler probe.   Baseline rate: 170 bpm.     Pt informed that the ultrasound is considered a limited OB ultrasound and is not intended to be a complete ultrasound exam.  Patient also informed that the ultrasound is not being completed with the intent of assessing for fetal or placental anomalies or any pelvic abnormalities.  Explained that the purpose of today's ultrasound is to assess for  viability - very active fetus by U/S exam.  Patient acknowledges the purpose of the exam and the limitations of the study.       Assessment:    Pregnancy: [id number] Patient Active Problem List   Diagnosis Date Noted  . Supervision of other normal pregnancy, antepartum 05/23/2018  . Encounter for screening for lipoid disorders 02/14/2018  . Leukocytosis 02/14/2018  . Encounter for biometric screening 02/14/2018  . 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 Abdominal cramping 06/15/2017  . Diarrhea 06/15/2017  . IBS (irritable bowel syndrome) 09/24/2014  . Diabetes mellitus screening 06/15/2013  . Migraine headache 03/19/2013       Plan:     Initial labs drawn. Prenatal vitamins. Problem list reviewed and updated. AFP3 discussed: unsure. Role of ultrasound in pregnancy discussed; fetal survey: requested. Amniocentesis discussed: not indicated. Follow up in 4 weeks. 100% of 45 min visit spent on counseling and coordination of care.     Raelyn Moraolitta Livana Yerian, MSN, CNM 05/24/2018

## 2018-05-24 NOTE — Patient Instructions (Signed)
First Trimester of Pregnancy The first trimester of pregnancy is from week 1 until the end of week 13 (months 1 through 3). During this time, your baby will begin to develop inside you. At 6-8 weeks, the eyes and face are formed, and the heartbeat can be seen on ultrasound. At the end of 12 weeks, all the baby's organs are formed. Prenatal care is all the medical care you receive before the birth of your baby. Make sure you get good prenatal care and follow all of your doctor's instructions. Follow these instructions at home: Medicines  Take over-the-counter and prescription medicines only as told by your doctor. Some medicines are safe and some medicines are not safe during pregnancy.  Take a prenatal vitamin that contains at least 600 micrograms (mcg) of folic acid.  If you have trouble pooping (constipation), take medicine that will make your stool soft (stool softener) if your doctor approves. Eating and drinking  Eat regular, healthy meals.  Your doctor will tell you the amount of weight gain that is right for you.  Avoid raw meat and uncooked cheese.  If you feel sick to your stomach (nauseous) or throw up (vomit): ? Eat 4 or 5 small meals a day instead of 3 large meals. ? Try eating a few soda crackers. ? Drink liquids between meals instead of during meals.  To prevent constipation: ? Eat foods that are high in fiber, like fresh fruits and vegetables, whole grains, and beans. ? Drink enough fluids to keep your pee (urine) clear or pale yellow. Activity  Exercise only as told by your doctor. Stop exercising if you have cramps or pain in your lower belly (abdomen) or low back.  Do not exercise if it is too hot, too humid, or if you are in a place of great height (high altitude).  Try to avoid standing for long periods of time. Move your legs often if you must stand in one place for a long time.  Avoid heavy lifting.  Wear low-heeled shoes. Sit and stand up straight.  You  can have sex unless your doctor tells you not to. Relieving pain and discomfort  Wear a good support bra if your breasts are sore.  Take warm water baths (sitz baths) to soothe pain or discomfort caused by hemorrhoids. Use hemorrhoid cream if your doctor says it is okay.  Rest with your legs raised if you have leg cramps or low back pain.  If you have puffy, bulging veins (varicose veins) in your legs: ? Wear support hose or compression stockings as told by your doctor. ? Raise (elevate) your feet for 15 minutes, 3-4 times a day. ? Limit salt in your food. Prenatal care  Schedule your prenatal visits by the twelfth week of pregnancy.  Write down your questions. Take them to your prenatal visits.  Keep all your prenatal visits as told by your doctor. This is important. Safety  Wear your seat belt at all times when driving.  Make a list of emergency phone numbers. The list should include numbers for family, friends, the hospital, and police and fire departments. General instructions  Ask your doctor for a referral to a local prenatal class. Begin classes no later than at the start of month 6 of your pregnancy.  Ask for help if you need counseling or if you need help with nutrition. Your doctor can give you advice or tell you where to go for help.  Do not use hot tubs, steam rooms, or   saunas.  Do not douche or use tampons or scented sanitary pads.  Do not cross your legs for long periods of time.  Avoid all herbs and alcohol. Avoid drugs that are not approved by your doctor.  Do not use any tobacco products, including cigarettes, chewing tobacco, and electronic cigarettes. If you need help quitting, ask your doctor. You may get counseling or other support to help you quit.  Avoid cat litter boxes and soil used by cats. These carry germs that can cause birth defects in the baby and can cause a loss of your baby (miscarriage) or stillbirth.  Visit your dentist. At home, brush  your teeth with a soft toothbrush. Be gentle when you floss. Contact a doctor if:  You are dizzy.  You have mild cramps or pressure in your lower belly.  You have a nagging pain in your belly area.  You continue to feel sick to your stomach, you throw up, or you have watery poop (diarrhea).  You have a bad smelling fluid coming from your vagina.  You have pain when you pee (urinate).  You have increased puffiness (swelling) in your face, hands, legs, or ankles. Get help right away if:  You have a fever.  You are leaking fluid from your vagina.  You have spotting or bleeding from your vagina.  You have very bad belly cramping or pain.  You gain or lose weight rapidly.  You throw up blood. It may look like coffee grounds.  You are around people who have German measles, fifth disease, or chickenpox.  You have a very bad headache.  You have shortness of breath.  You have any kind of trauma, such as from a fall or a car accident. Summary  The first trimester of pregnancy is from week 1 until the end of week 13 (months 1 through 3).  To take care of yourself and your unborn baby, you will need to eat healthy meals, take medicines only if your doctor tells you to do so, and do activities that are safe for you and your baby.  Keep all follow-up visits as told by your doctor. This is important as your doctor will have to ensure that your baby is healthy and growing well. This information is not intended to replace advice given to you by your health care provider. Make sure you discuss any questions you have with your health care provider. Document Released: 04/05/2008 Document Revised: 10/26/2016 Document Reviewed: 10/26/2016 Elsevier Interactive Patient Education  2017 [employer]  

## 2018-05-25 LAB — OBSTETRIC PANEL, INCLUDING HIV
Antibody Screen: NEGATIVE
BASOS ABS: 0 10*3/uL (ref 0.0–0.2)
Basos: 0 %
EOS (ABSOLUTE): 0.1 10*3/uL (ref 0.0–0.4)
Eos: 1 %
HIV Screen 4th Generation wRfx: NONREACTIVE
Hematocrit: 38.8 % (ref 34.0–46.6)
Hemoglobin: 12.9 g/dL (ref 11.1–15.9)
Hepatitis B Surface Ag: NEGATIVE
Immature Grans (Abs): 0 10*3/uL (ref 0.0–0.1)
Immature Granulocytes: 0 %
Lymphocytes Absolute: 3.6 10*3/uL — ABNORMAL HIGH (ref 0.7–3.1)
Lymphs: 33 %
MCH: 28.7 pg (ref 26.6–33.0)
MCHC: 33.2 g/dL (ref 31.5–35.7)
MCV: 86 fL (ref 79–97)
Monocytes Absolute: 0.7 10*3/uL (ref 0.1–0.9)
Monocytes: 6 %
Neutrophils Absolute: 6.5 10*3/uL (ref 1.4–7.0)
Neutrophils: 60 %
PLATELETS: 323 10*3/uL (ref 150–450)
RBC: 4.49 x10E6/uL (ref 3.77–5.28)
RDW: 12.1 % — AB (ref 12.3–15.4)
RPR Ser Ql: NONREACTIVE
Rh Factor: POSITIVE
Rubella Antibodies, IGG: 1.32 index (ref >0.99)
WBC: 10.9 10*3/uL — ABNORMAL HIGH (ref 3.4–10.8)

## 2018-05-26 ENCOUNTER — Ambulatory Visit: Payer: 59 | Admitting: Internal Medicine

## 2018-05-26 LAB — URINE CULTURE, OB REFLEX

## 2018-05-26 LAB — CULTURE, OB URINE

## 2018-05-31 ENCOUNTER — Ambulatory Visit (HOSPITAL_COMMUNITY)
Admission: RE | Admit: 2018-05-31 | Discharge: 2018-05-31 | Disposition: A | Discharge status: Home / Self Care | Payer: 59 | Source: Ambulatory Visit | Attending: Obstetrics and Gynecology | Admitting: Obstetrics and Gynecology

## 2018-05-31 DIAGNOSIS — Z3A11 11 weeks gestation of pregnancy: Secondary | ICD-10-CM | POA: Insufficient documentation

## 2018-05-31 DIAGNOSIS — Z3480 Encounter for supervision of other normal pregnancy, unspecified trimester: Secondary | ICD-10-CM

## 2018-05-31 DIAGNOSIS — Z3481 Encounter for supervision of other normal pregnancy, first trimester: Secondary | ICD-10-CM | POA: Diagnosis not present

## 2018-05-31 LAB — SMN1 COPY NUMBER ANALYSIS (SMA CARRIER SCREENING)

## 2018-05-31 LAB — CYSTIC FIBROSIS MUTATION 97: Interpretation: NOT DETECTED

## 2018-06-01 ENCOUNTER — Telehealth: Payer: Self-pay

## 2018-06-01 NOTE — Telephone Encounter (Signed)
Patient inform of test results. Will follow up at next ROB.

## 2018-06-14 ENCOUNTER — Telehealth: Payer: Self-pay

## 2018-06-14 NOTE — Telephone Encounter (Signed)
-----   Message from Lindell SparHeather L Bacon, [place] sent at 06/13/2018  3:19 PM EDT ----- Regarding: rx request for nausea Patient requesting a rx for nausea ([redacted] wks pregnant)  [facility] [place]

## 2018-06-14 NOTE — Telephone Encounter (Signed)
Called patient  - Patient reports that she's still breastfeeding and wanted to make sure the nausea medication prescribed was safe.  Advised patient I would review with the provider and call her back.  Per provider - prefer patient try Bonjesta samples for her nausea.  Called patient back - advised of provider recommendations - emphasized Alyssa Gutierrez is safe for her to take while breastfeeding. Patient stated if she does not come by before our office close, she'll have her husband, Alyssa Gutierrez, come by tomorrow, Thursday, 06/15/18, to pick up the samples. Patient had no there concerns or questions.

## 2018-06-20 ENCOUNTER — Ambulatory Visit (INDEPENDENT_AMBULATORY_CARE_PROVIDER_SITE_OTHER): Payer: 59 | Admitting: Family Medicine

## 2018-06-20 VITALS — BP 115/79 | HR 80 | Wt 151.2 lb

## 2018-06-20 DIAGNOSIS — G43009 Migraine without aura, not intractable, without status migrainosus: Secondary | ICD-10-CM

## 2018-06-20 DIAGNOSIS — Z3480 Encounter for supervision of other normal pregnancy, unspecified trimester: Secondary | ICD-10-CM

## 2018-06-20 MED ORDER — CYCLOBENZAPRINE HCL 10 MG PO TABS: 10.0000 mg | ORAL_TABLET | Freq: Three times a day (TID) | ORAL | 1 refills | Status: DC | PRN

## 2018-06-20 NOTE — Progress Notes (Signed)
Patient reports her migraines are coming back. She reports getting at least one migraine a week now.

## 2018-06-20 NOTE — Patient Instructions (Signed)

## 2018-06-20 NOTE — Progress Notes (Signed)
   PRENATAL VISIT NOTE  Subjective:  Alyssa HamiltonSarah B Gutierrez is a 32 y.o. [phone] at 3768w5d being seen today for ongoing prenatal care.  She is currently monitored for the following issues for this low-risk pregnancy and has Migraine headache; IBS (irritable bowel syndrome); Gluten-sensitive enteropathy; and Supervision of other normal pregnancy, antepartum on their problem list.  Patient reports headache.  Contractions: Not present. Vag. Bleeding: None.  Movement: Present. Denies leaking of fluid.   The following portions of the patient's history were reviewed and updated as appropriate: allergies, current medications, past family history, past medical history, past social history, past surgical history and problem list. Problem list updated.  Objective:   Vitals:   06/20/18 1611  BP: 115/79  Pulse: 80  Weight: 151 lb 3.2 oz (68.6 kg)    Fetal Status: Fetal Heart Rate (bpm): 150   Movement: Present     General:  Alert, oriented and cooperative. Patient is in no acute distress.  Skin: Skin is warm and dry. No rash noted.   Cardiovascular: Normal heart rate noted  Respiratory: Normal respiratory effort, no problems with respiration noted  Abdomen: Soft, gravid, appropriate for gestational age.  Pain/Pressure: Absent     Pelvic: Cervical exam deferred        Extremities: Normal range of motion.  Edema: None  Mental Status: Normal mood and affect. Normal behavior. Normal judgment and thought content.   Assessment and Plan:  Pregnancy: [id number] at 4168w5d  1. Supervision of other normal pregnancy, antepartum Order anatomy Declines genetics - US MFM OB DETAIL +14 WK; Future  2. Migraine without aura and without status migrainosus, not intractable Trial of Flexeril - cyclobenzaprine (FLEXERIL) 10 MG tablet; Take 1 tablet (10 mg total) by mouth every 8 (eight) hours as needed for muscle spasms.  Dispense: 30 tablet; Refill: 1  General obstetric precautions including but not limited to vaginal  bleeding, contractions, leaking of fluid and fetal movement were reviewed in detail with the patient. Please refer to After Visit Summary for other counseling recommendations.  Return in 4 weeks (on 07/18/2018) for see karen for headaches.  Future Appointments  Date Time Provider Department Center  07/19/2018  4:00 PM Armando ReichertHogan, Heather D, CNM [facility]  07/21/2018  1:30 PM [facility] US 1 [facility]  07/28/2018  8:30 AM Teague Edwena Blowlark, Karen E, PA-C [facility]    Alyssa Boresanya S Mazal Ebey, MD

## 2018-07-14 ENCOUNTER — Encounter (HOSPITAL_COMMUNITY): Payer: Self-pay

## 2018-07-19 ENCOUNTER — Encounter: Payer: Self-pay | Admitting: Advanced Practice Midwife

## 2018-07-19 ENCOUNTER — Ambulatory Visit (INDEPENDENT_AMBULATORY_CARE_PROVIDER_SITE_OTHER): Payer: 59 | Admitting: Advanced Practice Midwife

## 2018-07-19 VITALS — BP 109/72 | HR 91 | Wt 154.0 lb

## 2018-07-19 DIAGNOSIS — Z3480 Encounter for supervision of other normal pregnancy, unspecified trimester: Secondary | ICD-10-CM

## 2018-07-19 NOTE — Patient Instructions (Addendum)
BENEFITS OF BREASTFEEDING Many women wonder if they should breastfeed. Research shows that breast milk contains the perfect balance of vitamins, protein and fat that your baby needs to grow. It also contains antibodies that help your baby's immune system to fight off viruses and bacteria and can reduce the risk of sudden infant death syndrome (SIDS). In addition, the colostrum (a fluid secreted from the breast in the first few days after delivery) helps your newborn's digestive system to grow and function well. Breast milk is easier to digest than formula. Also, if your baby is born preterm, breast milk can help to reduce both short- and long-term health problems. BENEFITS OF BREASTFEEDING FOR MOM . Breastfeeding causes a hormone to be released that helps the uterus to contract and return to its normal size more quickly. . It aids in postpartum weight loss, reduces risk of breast and ovarian cancer, heart disease and rheumatoid arthritis. . It decreases the amount of bleeding after the baby is born. benefits of breastfeeding for baby . Provides comfort and nutrition . Protects baby against - Obesity - Diabetes - Asthma - Childhood cancers - Heart disease - Ear infections - Diarrhea - Pneumonia - Stomach problems - Serious allergies - Skin rashes . Promotes growth and development . Reduces the risk of baby having Sudden Infant Death Syndrome (SIDS) only breastmilk for the first 6 months . Protects baby against diseases/allergies . It's the perfect amount for tiny bellies . It restores baby's energy . Provides the best nutrition for baby . Giving water or formula can make baby more likely to get sick, decrease Mom's milk supply, make baby less content with breastfeeding Skin to Skin After delivery, the staff will place your baby on your chest. This helps with the following: . Regulates baby's temperature, breathing, heart rate and blood sugar . Increases Mom's milk supply . Promotes  bonding . Keeps baby and Mom calm and decreases baby's crying Rooming In Your baby will stay in your room with you for the entire time you are in the hospital. This helps with the following: . Allows Mom to learn baby's feeding cues - Fluttering eyes - Sucking on tongue or hand - Rooting (opens mouth and turns head) - Nuzzling into the breast - Bringing hand to mouth . Allows breastfeeding on demand (when your baby is ready) . Helps baby to be calm and content . Ensures a good milk supply . Prevents complications with breastfeeding . Allows parents to learn to care for baby . Allows you to request assistance with breastfeeding Importance of a good latch . Increases milk transfer to baby - baby gets enough milk . Ensures you have enough milk for your baby . Decreases nipple soreness . Don't use pacifiers and bottles - these cause baby to suck differently than breastfeeding . Promotes continuation of breastfeeding Risks of Formula Supplementation with Breastfeeding Giving your infant formula in addition to your breast-milk EXCEPT when medically necessary can lead to: Marland Kitchen Decreases your milk supply  . Loss of confidence in yourself for providing baby's nutrition  . Engorgement and possibly mastitis  . Asthma & allergies in the baby BREASTFEEDING FAQS How long should I breastfeed my baby? It is recommended that you provide your baby with breast milk only for the first 6 months and then continue for the first year and longer as desired. During the first few weeks after birth, your baby will need to feed 8-12 times every 24 hours, or every 2-3 hours. They will likely feed  for 15-30 minutes. How can I help my baby begin breastfeeding? Babies are born with an instinct to breastfeed. A healthy baby can begin breastfeeding right away without specific help. At the hospital, a nurse (or lactation consultant) will help you begin the process and will give you tips on good positioning. It may be  helpful to take a breastfeeding class before you deliver in order to know what to expect. How can I help my baby latch on? In order to assist your baby in latching-on, cup your breast in your hand and stroke your baby's lower lip with your nipple to stimulate your baby's rooting reflex. Your baby will look like he or she is yawning, at which point you should bring the baby towards your breast, while aiming the nipple at the roof of his or her mouth. Remember to bring the baby towards you and not your breast towards the baby. How can I tell if my baby is latched-on? Your baby will have all of your nipple and part of the dark area around the nipple in his or her mouth and your baby's nose will be touching your breast. You should see or hear the baby swallowing. If the baby is not latched-on properly, start the process over. To remove the suction, insert a clean finger between your breast and the baby's mouth. Should I switch breasts during feeding? After feeding on one side, switch the baby to your other breast. If he or she does not continue feeding - that is OK. Your baby will not necessarily need to feed from both breasts in a single feeding. On the next feeding, start with the other breast for efficiency and comfort. How can I tell if my baby is hungry? When your baby is hungry, they will nuzzle against your breast, make sucking noises and tongue motions and may put their hands near their mouth. Crying is a late sign of hunger, so you should not wait until this point. When they have received enough milk, they will unlatch from the breast. Is it okay to use a pacifier? Until your baby gets the hang of breastfeeding, experts recommend limiting pacifier usage. If you have questions about this, please contact your pediatrician. What can I do to ensure proper nutrition while breastfeeding? . Make sure that you support your own health and your baby's by eating a healthy, well-balanced diet . Your provider  may recommend that you continue to take your prenatal vitamin . Drink plenty of fluids. It is a good rule to drink one glass of water before or after feeding . Alcohol will remain in the breast milk for as long as it will remain in the blood stream. If you choose to have a drink, it is recommended that you wait at least 2 hours before feeding . Moderate amounts of caffeine are OK . Some over-the-counter or prescription medications are not recommended during breastfeeding. Check with your provider if you have questions What types of birth control methods are safe while breastfeeding? Progestin-only methods, including a daily pill, an IUD, the implant and the injection are safe while breastfeeding. Methods that contain estrogen (such as combination birth control pills, the vaginal ring and the patch) should not be used during the first month of breastfeeding as these can decrease your milk supply.  Considering Waterbirth? Guide for patients at [facility] [employer]  Why consider waterbirth?  . Gentle birth for babies . Less pain medicine used in labor . May allow for passive descent/less pushing .  May reduce perineal tears  . More mobility and instinctive maternal position changes . Increased maternal relaxation . Reduced blood pressure in labor  Is waterbirth safe? What are the risks of infection, drowning or other complications?  . Infection: o Very low risk (3.7 % for tub vs 4.8% for bed) o 7 in 8000 waterbirths with documented infection o Poorly cleaned equipment most common cause o Slightly lower group B strep transmission rate  . Drowning o Maternal:  - Very low risk   - Related to seizures or fainting o Newborn:  - Very low risk. No evidence of increased risk of respiratory problems in multiple large studies - Physiological protection from breathing under water - Avoid underwater birth if there are any fetal complications - Once baby's head is out of the water, keep  it out.  . Birth complication o Some reports of cord trauma, but risk decreased by bringing baby to surface gradually o No evidence of increased risk of shoulder dystocia. Mothers can usually change positions faster in water than in a bed, possibly aiding the maneuvers to free the shoulder.   You must attend a Doren Custard class at [facility]  3rd Wednesday of every month from 7-9pm  [employer] by calling [phone] or online at [email or web address]  Bring [place] the certificate from the class to your prenatal appointment  Meet with a midwife at 36 weeks to see if you can still plan a waterbirth and to sign the consent.   Purchase or rent the following supplies: You are responsible for providing all supplies listed above. **If you do not have all necessary supplies you cannot have a waterbirth.**   Water Birth Pool (Birth Pool in a Box or [place] for instance)  (Tubs start ~$125)  Single-use disposable tub liner designed for your brand of tub  Electric drain pump to remove water (We recommend 792 gallon per hour or greater pump.)   New garden hose labeled "lead-free", "suitable for drinking water",  Separate garden hose to remove the dirty water  Fish net  Bathing suit top (optional)  Long-handled mirror (optional)  Places to purchase or rent supplies:   [employer] for tub purchases and supplies  [employer]com for tub purchases and supplies  [employer] (www.thelaborladies.com) $275 for tub rental/set-up & take down/kit   [employer] (http://www.fleming.com/.htm) Information regarding doulas (labor support) who provide pool rentals  Things that would prevent you from having a waterbirth:  Premature, <37wks  Previous cesarean birth  Presence of thick meconium-stained fluid  Multiple gestation (Twins, triplets, etc.)  Uncontrolled diabetes or gestational diabetes requiring medication  Hypertension  requiring medication or diagnosis of pre-eclampsia  Heavy vaginal bleeding  Non-reassuring fetal heart rate  Active infection (MRSA, etc.). Group B Strep is NOT a contraindication for waterbirth.  If your labor has to be induced and induction method requires continuous monitoring of the baby's heart rate  Other risks/issues identified by your obstetrical provider  Please remember that birth is unpredictable. Under certain unforeseeable circumstances your provider may advise against giving birth in the tub. These decisions will be made on a case-by-case basis and with the safety of you and your baby as our highest priority.

## 2018-07-19 NOTE — Progress Notes (Signed)
   PRENATAL VISIT NOTE  Subjective:  Alyssa Gutierrez is a 32 y.o. [phone] at 6184w6d being seen today for ongoing prenatal care.  She is currently monitored for the following issues for this low-risk pregnancy and has Migraine headache; IBS (irritable bowel syndrome); Gluten-sensitive enteropathy; and Supervision of other normal pregnancy, antepartum on their problem list.  Patient reports no complaints.  Contractions: Not present. Vag. Bleeding: None.  Movement: Present. Denies leaking of fluid.   The following portions of the patient's history were reviewed and updated as appropriate: allergies, current medications, past family history, past medical history, past social history, past surgical history and problem list. Problem list updated.  Objective:   Vitals:   07/19/18 1611  BP: 109/72  Pulse: 91  Weight: 154 lb (69.9 kg)    Fetal Status: Fetal Heart Rate (bpm): 152 Fundal Height: 17 cm Movement: Present     General:  Alert, oriented and cooperative. Patient is in no acute distress.  Skin: Skin is warm and dry. No rash noted.   Cardiovascular: Normal heart rate noted  Respiratory: Normal respiratory effort, no problems with respiration noted  Abdomen: Soft, gravid, appropriate for gestational age.  Pain/Pressure: Absent     Pelvic: Cervical exam deferred        Extremities: Normal range of motion.  Edema: None  Mental Status: Normal mood and affect. Normal behavior. Normal judgment and thought content.   Assessment and Plan:  Pregnancy: [id number] at 7984w6d  1. Supervision of other normal pregnancy, antepartum - Routine care - Desires water birth. Planning to retake class  Preterm labor symptoms and general obstetric precautions including but not limited to vaginal bleeding, contractions, leaking of fluid and fetal movement were reviewed in detail with the patient. Please refer to After Visit Summary for other counseling recommendations.  Return in about 4 weeks (around  08/16/2018).  Future Appointments  Date Time Provider Department Center  07/21/2018  1:30 PM [facility] US 1 [facility]  07/28/2018  8:30 AM Teague Edwena Blowlark, Karen E, PA-C [facility]  08/16/2018  3:45 PM Calvert CantorWeinhold, Samantha C, CNM [facility]    Thressa ShellerHeather Lorielle Boehning, CNM

## 2018-07-21 ENCOUNTER — Ambulatory Visit (HOSPITAL_COMMUNITY)
Admission: RE | Admit: 2018-07-21 | Discharge: 2018-07-21 | Disposition: A | Discharge status: Home / Self Care | Payer: 59 | Source: Ambulatory Visit | Attending: Family Medicine | Admitting: Family Medicine

## 2018-07-21 DIAGNOSIS — Z3482 Encounter for supervision of other normal pregnancy, second trimester: Secondary | ICD-10-CM | POA: Insufficient documentation

## 2018-07-21 DIAGNOSIS — Z3480 Encounter for supervision of other normal pregnancy, unspecified trimester: Secondary | ICD-10-CM

## 2018-07-21 DIAGNOSIS — Z363 Encounter for antenatal screening for malformations: Secondary | ICD-10-CM

## 2018-07-21 DIAGNOSIS — O2692 Pregnancy related conditions, unspecified, second trimester: Secondary | ICD-10-CM | POA: Diagnosis not present

## 2018-07-21 DIAGNOSIS — Z3A18 18 weeks gestation of pregnancy: Secondary | ICD-10-CM | POA: Insufficient documentation

## 2018-07-24 ENCOUNTER — Other Ambulatory Visit (HOSPITAL_COMMUNITY): Payer: Self-pay | Admitting: *Deleted

## 2018-07-24 DIAGNOSIS — O4443 Low lying placenta NOS or without hemorrhage, third trimester: Secondary | ICD-10-CM

## 2018-07-28 ENCOUNTER — Ambulatory Visit (INDEPENDENT_AMBULATORY_CARE_PROVIDER_SITE_OTHER): Payer: 59 | Admitting: Physician Assistant

## 2018-07-28 ENCOUNTER — Encounter: Payer: Self-pay | Admitting: Physician Assistant

## 2018-07-28 DIAGNOSIS — O26899 Other specified pregnancy related conditions, unspecified trimester: Secondary | ICD-10-CM

## 2018-07-28 DIAGNOSIS — R51 Headache: Secondary | ICD-10-CM | POA: Diagnosis not present

## 2018-07-28 DIAGNOSIS — M62838 Other muscle spasm: Secondary | ICD-10-CM | POA: Diagnosis not present

## 2018-07-28 DIAGNOSIS — R519 Headache, unspecified: Secondary | ICD-10-CM | POA: Insufficient documentation

## 2018-07-28 DIAGNOSIS — G43009 Migraine without aura, not intractable, without status migrainosus: Secondary | ICD-10-CM | POA: Diagnosis not present

## 2018-07-28 MED ORDER — CYCLOBENZAPRINE HCL 10 MG PO TABS: 10.0000 mg | ORAL_TABLET | Freq: Three times a day (TID) | ORAL | 2 refills | Status: DC | PRN

## 2018-07-28 MED ORDER — PROMETHAZINE HCL 12.5 MG PO TABS: 12.5000 mg | ORAL_TABLET | Freq: Four times a day (QID) | ORAL | 0 refills | Status: DC | PRN

## 2018-07-28 NOTE — Patient Instructions (Signed)

## 2018-07-28 NOTE — Progress Notes (Signed)
History:  Alyssa Gutierrez is a 32 y.o. [phone] who presents to clinic today for headaches.  She was on gabapentin to control headaches. She has been getting headaches since her childhood but migraines started in her 30s.  Her headaches can be severe, lasting all day potentially, movement makes it worse, throbbing mostly across her forehead and temples occasionally with pain in the neck/back of head.  She has photophobia, phonophobia, osmophobia.   Hair hurts.  No aura.  Nausea and dry heaves but no vomiting.  Stomach pain.   HAs worse during this pregnancy - this time she is having a girl and before she has had 2 boys.   Food triggers include strong spices and garlic.   Recently prescribed cyclobenzaprine but it causes sleepiness.  Sleep helps.   Tylenol dulls the pain until she can get to bed. Other meds: gabapentin and excedrin  HIT6:68 Number of days in the last 4 weeks with:  Severe headache: 4 Moderate headache: 2 Mild headache: 3  No headache: 11   Past Medical History:  Diagnosis Date  . Family history of heart murmur    at birth  . History of allergy   . History of chicken pox    age 89  . History of headache   . History of migraine   . IBS (irritable bowel syndrome)   . Nausea 03/14/2015   Around day of ovulation every month      Social History   Socioeconomic History  . Marital status: Married    Spouse name: Not on file  . Number of children: 0  . Years of education: Not on file  . Highest education level: Not on file  Occupational History  . Occupation: Emergency planning/management officer: [employer]  Social Needs  . Financial resource strain: Not on file  . Food insecurity:    Worry: Not on file    Inability: Not on file  . Transportation needs:    Medical: Not on file    Non-medical: Not on file  Tobacco Use  . Smoking status: Never Smoker  . Smokeless tobacco: Never Used  Substance and Sexual Activity  . Alcohol use: No    Alcohol/week: 0.0 standard drinks   . Drug use: No  . Sexual activity: Yes    Partners: Male    Birth control/protection: None  Lifestyle  . Physical activity:    Days per week: Not on file    Minutes per session: Not on file  . Stress: Not on file  Relationships  . Social connections:    Talks on phone: Not on file    Gets together: Not on file    Attends religious service: Not on file    Active member of club or organization: Not on file    Attends meetings of clubs or organizations: Not on file    Relationship status: Not on file  . Intimate partner violence:    Fear of current or ex partner: Not on file    Emotionally abused: Not on file    Physically abused: Not on file    Forced sexual activity: Not on file  Other Topics Concern  . Not on file  Social History Narrative  . Not on file    Family History  Problem Relation Age of Onset  . Hypertension Father   . Diabetes Paternal Grandmother   . Heart murmur Brother   . Hypertension Paternal Uncle   . Crohn's disease Maternal Grandmother   .  Colon cancer Neg Hx     No Known Allergies  Current Outpatient Medications on File Prior to Visit  Medication Sig Dispense Refill  . acetaminophen (TYLENOL) 500 MG tablet Take 1,000 mg by mouth every 6 (six) hours as needed for mild pain, moderate pain or headache.     . cyclobenzaprine (FLEXERIL) 10 MG tablet Take 1 tablet (10 mg total) by mouth every 8 (eight) hours as needed for muscle spasms. 30 tablet 1  . Prenatal MV-Min-Fe Fum-FA-DHA (PRENATAL 1 PO) Take by mouth.     No current facility-administered medications on file prior to visit.      Review of Systems:  All pertinent positive/negative included in HPI, all other review of systems are negative   Objective:  Physical Exam BP 112/73   Pulse 87   Wt 155 lb 12.8 oz (70.7 kg)   LMP 03/16/2018   BMI 27.60 kg/m  CONSTITUTIONAL: Well-developed, well-nourished female in no acute distress.  EYES: EOM intact ENT: Normocephalic CARDIOVASCULAR:  Regular rate and rhythm with no adventitious sounds.  RESPIRATORY: Normal rate. Clear to auscultation bilaterally. MUSCULOSKELETAL: Normal ROM SKIN: Warm, dry without erythema  NEUROLOGICAL: Alert, oriented, CN II-XII grossly intact, Appropriate balance, No dysmetria, Sensation equal bilaterally, Romberg negative.   PSYCH: Normal behavior, mood   Assessment & Plan:  Assessment: 1. Migraine without aura and without status migrainosus, not intractable   2. Pregnancy headache, antepartum   3. Muscle spasm      Plan: Flexeril for HA/muscle spasm - okay to break tab in half or use in the evening Tylenol - okay PRN up to 2 days/week Biofreeze =use liberally Essential oil - peppermint may be helpful - use as tolerated May try acupuncture/acutac Phenergan - use for HA rescue - may use 2 caps if 1 is not enough Drink plenty of water  May consider fioricet if needed  Follow-up in 3 months or sooner PRN  Bertram Denver, PA-C 07/28/2018 8:57 AM

## 2018-08-04 ENCOUNTER — Telehealth: Payer: Self-pay | Admitting: *Deleted

## 2018-08-04 NOTE — Telephone Encounter (Signed)
Pt called in asking if she had any restrictions due to her low lying placenta as she is going out of town for a few days. Informed pt there are no restrictions, but if she does have any vaginal bleeding to call MD, go to [facility], or nearest hospital. PT verbalizes and understands. Scheryl Marten, RN

## 2018-08-16 ENCOUNTER — Ambulatory Visit (INDEPENDENT_AMBULATORY_CARE_PROVIDER_SITE_OTHER): Payer: 59 | Admitting: Advanced Practice Midwife

## 2018-08-16 DIAGNOSIS — Z3480 Encounter for supervision of other normal pregnancy, unspecified trimester: Secondary | ICD-10-CM

## 2018-08-16 DIAGNOSIS — O4440 Low lying placenta NOS or without hemorrhage, unspecified trimester: Secondary | ICD-10-CM

## 2018-08-16 NOTE — Progress Notes (Signed)
   PRENATAL VISIT NOTE  Subjective:  Alyssa Gutierrez is a 32 y.o. [phone] at [redacted]w[redacted]d being seen today for ongoing prenatal care.  She is currently monitored for the following issues for this low-risk pregnancy and has Migraine headache; IBS (irritable bowel syndrome); Gluten-sensitive enteropathy; Supervision of other normal pregnancy, antepartum; Muscle spasm; Pregnancy headache, antepartum; and Low-lying placenta on their problem list.  Patient reports no complaints.  Contractions: Irritability. Vag. Bleeding: None.  Movement: Present. Denies leaking of fluid.   The following portions of the patient's history were reviewed and updated as appropriate: allergies, current medications, past family history, past medical history, past social history, past surgical history and problem list. Problem list updated.  Objective:   Vitals:   08/16/18 1546  BP: 117/74  Pulse: 88  Weight: 161 lb (73 kg)    Fetal Status: Fetal Heart Rate (bpm): 156 Fundal Height: 21 cm Movement: Present     General:  Alert, oriented and cooperative. Patient is in no acute distress.  Skin: Skin is warm and dry. No rash noted.   Cardiovascular: Normal heart rate noted  Respiratory: Normal respiratory effort, no problems with respiration noted  Abdomen: Soft, gravid, appropriate for gestational age.  Pain/Pressure: Absent     Pelvic: Cervical exam deferred        Extremities: Normal range of motion.  Edema: None  Mental Status: Normal mood and affect. Normal behavior. Normal judgment and thought content.   Assessment and Plan:  Pregnancy: [id number] at [redacted]w[redacted]d  1. Supervision of other normal pregnancy, antepartum --No complaints or concerns, continue routine care --Discussed ob complications that would merit triage at [facility], [facility] or closest ED. Reassured patient each site is equipped to provide care and consult as necessary  2. Low-lying placenta --Reassurance provided.  --Repeat US scheduled for 09/29/2018  Preterm  labor symptoms and general obstetric precautions including but not limited to vaginal bleeding, contractions, leaking of fluid and fetal movement were reviewed in detail with the patient. Please refer to After Visit Summary for other counseling recommendations.  Return in about 4 weeks (around 09/13/2018).  Future Appointments  Date Time Provider Department Center  09/29/2018  1:15 PM [facility] [place] 4 [facility]    Calvert Cantor, CNM 08/16/18  4:04 PM

## 2018-08-16 NOTE — Patient Instructions (Signed)
Second Trimester of Pregnancy The second trimester is from week 13 through week 28, month 4 through 6. This is often the time in pregnancy that you feel your best. Often times, morning sickness has lessened or quit. You may have more energy, and you may get hungry more often. Your unborn baby (fetus) is growing rapidly. At the end of the sixth month, he or she is about 9 inches long and weighs about 1 pounds. You will likely feel the baby move (quickening) between 18 and 20 weeks of pregnancy.  Research childbirth classes and hospital preregistration at [email or web address]  Follow these instructions at home:  Avoid all smoking, herbs, and alcohol. Avoid drugs not approved by your doctor.  Do not use any tobacco products, including cigarettes, chewing tobacco, and electronic cigarettes. If you need help quitting, ask your doctor. You may get counseling or other support to help you quit.  Only take medicine as told by your doctor. Some medicines are safe and some are not during pregnancy.  Exercise only as told by your doctor. Stop exercising if you start having cramps.  Eat regular, healthy meals.  Wear a good support bra if your breasts are tender.  Do not use hot tubs, steam rooms, or saunas.  Wear your seat belt when driving.  Avoid raw meat, uncooked cheese, and liter boxes and soil used by cats.  Take your prenatal vitamins.  Take 1500-2000 milligrams of calcium daily starting at the 20th week of pregnancy until you deliver your baby.  Try taking medicine that helps you poop (stool softener) as needed, and if your doctor approves. Eat more fiber by eating fresh fruit, vegetables, and whole grains. Drink enough fluids to keep your pee (urine) clear or pale yellow.  Take warm water baths (sitz baths) to soothe pain or discomfort caused by hemorrhoids. Use hemorrhoid cream if your doctor approves.  If you have puffy, bulging veins (varicose veins), wear support hose. Raise  (elevate) your feet for 15 minutes, 3-4 times a day. Limit salt in your diet.  Avoid heavy lifting, wear low heals, and sit up straight.  Rest with your legs raised if you have leg cramps or low back pain.  Visit your dentist if you have not gone during your pregnancy. Use a soft toothbrush to brush your teeth. Be gentle when you floss.  You can have sex (intercourse) unless your doctor tells you not to.  Go to your doctor visits.  Get help if:  You feel dizzy.  You have mild cramps or pressure in your lower belly (abdomen).  You have a nagging pain in your belly area.  You continue to feel sick to your stomach (nauseous), throw up (vomit), or have watery poop (diarrhea).  You have bad smelling fluid coming from your vagina.  You have pain with peeing (urination). Get help right away if:  You have a fever.  You are leaking fluid from your vagina.  You have spotting or bleeding from your vagina.  You have severe belly cramping or pain.  You lose or gain weight rapidly.  You have trouble catching your breath and have chest pain.  You notice sudden or extreme puffiness (swelling) of your face, hands, ankles, feet, or legs.  You have not felt the baby move in over an hour.  You have severe headaches that do not go away with medicine.  You have vision changes. This information is not intended to replace advice given to you by your health care provider. Make   sure you discuss any questions you have with your health care provider. Document Released: 01/12/2010 Document Revised: 03/25/2016 Document Reviewed: 12/19/2012 Elsevier Interactive Patient Education  2017 [employer]    

## 2018-08-22 ENCOUNTER — Ambulatory Visit (HOSPITAL_COMMUNITY)
Admission: RE | Admit: 2018-08-22 | Discharge: 2018-08-22 | Disposition: A | Discharge status: Home / Self Care | Payer: 59 | Source: Ambulatory Visit | Attending: Maternal and Fetal Medicine | Admitting: Maternal and Fetal Medicine

## 2018-08-22 DIAGNOSIS — Z3A22 22 weeks gestation of pregnancy: Secondary | ICD-10-CM | POA: Insufficient documentation

## 2018-08-22 DIAGNOSIS — O4443 Low lying placenta NOS or without hemorrhage, third trimester: Secondary | ICD-10-CM | POA: Diagnosis not present

## 2018-08-22 DIAGNOSIS — O321XX0 Maternal care for breech presentation, not applicable or unspecified: Secondary | ICD-10-CM | POA: Diagnosis not present

## 2018-09-13 ENCOUNTER — Ambulatory Visit (INDEPENDENT_AMBULATORY_CARE_PROVIDER_SITE_OTHER): Payer: 59 | Admitting: Advanced Practice Midwife

## 2018-09-13 VITALS — BP 116/74 | HR 72 | Wt 164.8 lb

## 2018-09-13 DIAGNOSIS — Z3480 Encounter for supervision of other normal pregnancy, unspecified trimester: Secondary | ICD-10-CM

## 2018-09-13 DIAGNOSIS — Z3482 Encounter for supervision of other normal pregnancy, second trimester: Secondary | ICD-10-CM

## 2018-09-13 DIAGNOSIS — O4440 Low lying placenta NOS or without hemorrhage, unspecified trimester: Secondary | ICD-10-CM

## 2018-09-13 NOTE — Patient Instructions (Signed)

## 2018-09-13 NOTE — Progress Notes (Signed)
   PRENATAL VISIT NOTE  Subjective:  Alyssa Gutierrez is a 32 y.o. [phone] at 6653w6d being seen today for ongoing prenatal care.  She is currently monitored for the following issues for this low-risk pregnancy and has Migraine headache; IBS (irritable bowel syndrome); Gluten-sensitive enteropathy; Supervision of other normal pregnancy, antepartum; Muscle spasm; Pregnancy headache, antepartum; and Low-lying placenta on their problem list.  Patient reports no complaints.  Contractions: Irritability. Vag. Bleeding: None.  Movement: Present. Denies leaking of fluid.   The following portions of the patient's history were reviewed and updated as appropriate: allergies, current medications, past family history, past medical history, past social history, past surgical history and problem list. Problem list updated.  Objective:   Vitals:   09/13/18 1542  BP: 116/74  Pulse: 72  Weight: 164 lb 12.8 oz (74.8 kg)    Fetal Status: Fetal Heart Rate (bpm): 160 Fundal Height: 27 cm Movement: Present     General:  Alert, oriented and cooperative. Patient is in no acute distress.  Skin: Skin is warm and dry. No rash noted.   Cardiovascular: Normal heart rate noted  Respiratory: Normal respiratory effort, no problems with respiration noted  Abdomen: Soft, gravid, appropriate for gestational age.  Pain/Pressure: Present     Pelvic: Cervical exam deferred        Extremities: Normal range of motion.  Edema: None  Mental Status: Normal mood and affect. Normal behavior. Normal judgment and thought content.   Assessment and Plan:  Pregnancy: [id number] at 7853w6d  1. Supervision of other normal pregnancy, antepartum --No complaints or concerns, continue routine care. Discussed milestones for future visits --Anticipatory guidance for third trimester (see AVS) --History of fast Stage 1 of labor: 7 hours with P1 with AROM, 5 hours with P2 and SROM --Continues to desire waterbirth, taking December class --Declined  Flu shot today  2. Low-lying placenta --Resolved, per MFM 08/22/18  Preterm labor symptoms and general obstetric precautions including but not limited to vaginal bleeding, contractions, leaking of fluid and fetal movement were reviewed in detail with the patient. Please refer to After Visit Summary for other counseling recommendations.  Return in about 3 weeks (around 10/04/2018) for fasting for 2 hour GTT, third trimester labs.  Future Appointments  Date Time Provider Department Center  10/04/2018  8:15 AM Federico FlakeNewton, Kimberly Niles, MD [facility]  10/06/2018  9:30 AM Bruna Pottereague Edwena Blowlark, Karen E, PA-C [facility]    Calvert CantorSamantha C Weinhold, CNM

## 2018-09-29 ENCOUNTER — Ambulatory Visit (HOSPITAL_COMMUNITY): Payer: 59

## 2018-10-04 ENCOUNTER — Ambulatory Visit (INDEPENDENT_AMBULATORY_CARE_PROVIDER_SITE_OTHER): Payer: 59 | Admitting: Family Medicine

## 2018-10-04 ENCOUNTER — Encounter: Payer: Self-pay | Admitting: Family Medicine

## 2018-10-04 VITALS — BP 113/77 | HR 72 | Wt 166.8 lb

## 2018-10-04 DIAGNOSIS — Z3403 Encounter for supervision of normal first pregnancy, third trimester: Secondary | ICD-10-CM

## 2018-10-04 DIAGNOSIS — O4440 Low lying placenta NOS or without hemorrhage, unspecified trimester: Secondary | ICD-10-CM

## 2018-10-04 DIAGNOSIS — Z3480 Encounter for supervision of other normal pregnancy, unspecified trimester: Secondary | ICD-10-CM

## 2018-10-04 DIAGNOSIS — Z3A28 28 weeks gestation of pregnancy: Secondary | ICD-10-CM

## 2018-10-04 DIAGNOSIS — Z3402 Encounter for supervision of normal first pregnancy, second trimester: Secondary | ICD-10-CM

## 2018-10-04 DIAGNOSIS — Z3483 Encounter for supervision of other normal pregnancy, third trimester: Secondary | ICD-10-CM

## 2018-10-04 NOTE — Progress Notes (Signed)
Declined tdap today

## 2018-10-04 NOTE — Progress Notes (Signed)
   PRENATAL VISIT NOTE  Subjective:  Alyssa Gutierrez is a 32 y.o. [phone] at 2079w6d being seen today for ongoing prenatal care.  She is currently monitored for the following issues for this low-risk pregnancy and has Migraine headache; IBS (irritable bowel syndrome); Gluten-sensitive enteropathy; Supervision of other normal pregnancy, antepartum; Muscle spasm; Pregnancy headache, antepartum; and Low-lying placenta on their problem list.  Patient reports no complaints.  Contractions: Not present.  .  Movement: Present. Denies leaking of fluid.   The following portions of the patient's history were reviewed and updated as appropriate: allergies, current medications, past family history, past medical history, past social history, past surgical history and problem list. Problem list updated.  Objective:   Vitals:   10/04/18 0827  BP: 113/77  Pulse: 72  Weight: 166 lb 12.8 oz (75.7 kg)    Fetal Status: Fetal Heart Rate (bpm): 151   Movement: Present     General:  Alert, oriented and cooperative. Patient is in no acute distress.  Skin: Skin is warm and dry. No rash noted.   Cardiovascular: Normal heart rate noted  Respiratory: Normal respiratory effort, no problems with respiration noted  Abdomen: Soft, gravid, appropriate for gestational age.  Pain/Pressure: Absent     Pelvic: Cervical exam deferred        Extremities: Normal range of motion.  Edema: None  Mental Status: Normal mood and affect. Normal behavior. Normal judgment and thought content.   Assessment and Plan:  Pregnancy: [id number] at 2879w6d  1. Encounter for supervision of normal first pregnancy in second trimester - Glucose Tolerance, 2 Hours w/1 Hour - RPR - HIV Antibody (routine testing w rflx) - CBC  2. Supervision of other normal pregnancy, antepartum UTD, declines Tdap today but plans to get at next visit Plans to take water birth class Discussed IUD vs BTL  3. Low-lying placenta RESOLVED  Preterm labor  symptoms and general obstetric precautions including but not limited to vaginal bleeding, contractions, leaking of fluid and fetal movement were reviewed in detail with the patient. Please refer to After Visit Summary for other counseling recommendations.  Return in about 2 weeks (around 10/18/2018) for Routine prenatal care.  Future Appointments  Date Time Provider Department Center  10/18/2018  4:15 PM Federico FlakeNewton, Kimberly Niles, MD [facility]  11/02/2018  8:30 AM  BingPickens, Charlie, MD [facility]  11/16/2018  4:15 PM Reva BoresPratt, Tanya S, MD [facility]  11/29/2018  4:15 PM Federico FlakeNewton, Kimberly Niles, MD [facility]    Federico FlakeKimberly Niles Newton, MD

## 2018-10-05 LAB — CBC
Hematocrit: 36.7 % (ref 34.0–46.6)
Hemoglobin: 12.1 g/dL (ref 11.1–15.9)
MCH: 28.9 pg (ref 26.6–33.0)
MCHC: 33 g/dL (ref 31.5–35.7)
MCV: 88 fL (ref 79–97)
Platelets: 275 10*3/uL (ref 150–450)
RBC: 4.19 x10E6/uL (ref 3.77–5.28)
RDW: 12.3 % (ref 12.3–15.4)
WBC: 10.3 10*3/uL (ref 3.4–10.8)

## 2018-10-05 LAB — GLUCOSE TOLERANCE, 2 HOURS W/ 1HR
GLUCOSE, 2 HOUR: 136 mg/dL (ref 65–152)
GLUCOSE, FASTING: 88 mg/dL (ref 65–91)
Glucose, 1 hour: 156 mg/dL (ref 65–179)

## 2018-10-05 LAB — RPR: RPR Ser Ql: NONREACTIVE

## 2018-10-05 LAB — HIV ANTIBODY (ROUTINE TESTING W REFLEX): HIV Screen 4th Generation wRfx: NONREACTIVE

## 2018-10-06 ENCOUNTER — Encounter: Payer: 59 | Admitting: Physician Assistant

## 2018-10-18 ENCOUNTER — Encounter: Payer: Self-pay | Admitting: Family Medicine

## 2018-10-18 ENCOUNTER — Ambulatory Visit (INDEPENDENT_AMBULATORY_CARE_PROVIDER_SITE_OTHER): Payer: 59 | Admitting: Family Medicine

## 2018-10-18 ENCOUNTER — Encounter: Payer: 59 | Admitting: Family Medicine

## 2018-10-18 VITALS — BP 116/74 | HR 99 | Wt 172.0 lb

## 2018-10-18 DIAGNOSIS — Z3480 Encounter for supervision of other normal pregnancy, unspecified trimester: Secondary | ICD-10-CM

## 2018-10-18 DIAGNOSIS — Z23 Encounter for immunization: Secondary | ICD-10-CM | POA: Diagnosis not present

## 2018-10-18 DIAGNOSIS — O26899 Other specified pregnancy related conditions, unspecified trimester: Secondary | ICD-10-CM

## 2018-10-18 DIAGNOSIS — O4440 Low lying placenta NOS or without hemorrhage, unspecified trimester: Secondary | ICD-10-CM

## 2018-10-18 DIAGNOSIS — R519 Headache, unspecified: Secondary | ICD-10-CM

## 2018-10-18 DIAGNOSIS — R51 Headache: Secondary | ICD-10-CM

## 2018-10-18 DIAGNOSIS — Z3483 Encounter for supervision of other normal pregnancy, third trimester: Secondary | ICD-10-CM

## 2018-10-18 DIAGNOSIS — O26893 Other specified pregnancy related conditions, third trimester: Secondary | ICD-10-CM

## 2018-10-18 NOTE — Progress Notes (Signed)
   PRENATAL VISIT NOTE  Subjective:  Alyssa Gutierrez is a 32 y.o. [phone] at 5441w6d being seen today for ongoing prenatal care.  She is currently monitored for the following issues for this low-risk pregnancy and has Migraine headache; IBS (irritable bowel syndrome); Gluten-sensitive enteropathy; Supervision of other normal pregnancy, antepartum; Muscle spasm; Pregnancy headache, antepartum; and Low-lying placenta on their problem list.  Patient reports no complaints.  Contractions: Not present. Vag. Bleeding: None.  Movement: Present. Denies leaking of fluid.   The following portions of the patient's history were reviewed and updated as appropriate: allergies, current medications, past family history, past medical history, past social history, past surgical history and problem list. Problem list updated.  Objective:   Vitals:   10/18/18 1626  BP: 116/74  Pulse: 99  Weight: 172 lb (78 kg)    Fetal Status: Fetal Heart Rate (bpm): 156   Movement: Present     General:  Alert, oriented and cooperative. Patient is in no acute distress.  Skin: Skin is warm and dry. No rash noted.   Cardiovascular: Normal heart rate noted  Respiratory: Normal respiratory effort, no problems with respiration noted  Abdomen: Soft, gravid, appropriate for gestational age.  Pain/Pressure: Present     Pelvic: Cervical exam deferred        Extremities: Normal range of motion.  Edema: None  Mental Status: Normal mood and affect. Normal behavior. Normal judgment and thought content.   Assessment and Plan:  Pregnancy: [id number] at 7341w6d  1. Supervision of other normal pregnancy, antepartum Up to date Planning on waterbirth  2. Pregnancy headache, antepartum None currently  3. Low-lying placenta resolved  Preterm labor symptoms and general obstetric precautions including but not limited to vaginal bleeding, contractions, leaking of fluid and fetal movement were reviewed in detail with the patient. Please  refer to After Visit Summary for other counseling recommendations.  Return in about 2 weeks (around 11/01/2018) for Routine prenatal care.  Future Appointments  Date Time Provider Department Center  11/02/2018  8:30 AM Retsof BingPickens, Charlie, MD [facility]  11/16/2018  4:15 PM Reva BoresPratt, Tanya S, MD [facility]  11/29/2018  4:15 PM Federico FlakeNewton, Maybel Dambrosio Niles, MD [facility]    Federico FlakeKimberly Niles Emmilynn Marut, MD

## 2018-10-18 NOTE — Patient Instructions (Signed)
Come to the MAU (maternity admission unit) for 1) Strong contractions every 2-3 minutes for at least 1 hour that do not go away when you drink water or take a warm shower. These contractions will be so strong all you can do is breath through them 2) Vaginal bleeding- anything more than spotting 3) Loss of fluid like you broke your water 4) Decreased movement of your baby  

## 2018-11-01 NOTE — L&D Delivery Note (Signed)
   Delivery Note AROM at 0655, and at 7:00 AM a viable female was delivered via Vaginal, Spontaneous (Presentation: ROA, nuchal cord reduced;  ).  APGAR: 9, 9; weight pending.  After 1 minute, the cord was clamped and cut. 40 units of pitocin diluted in 1000cc LR was infused rapidly IV.  The placenta separated spontaneously and delivered via CCT and maternal pushing effort.  It was inspected and appears to be intact with a 3 VC.  Marland Kitchen     Anesthesia:local   Episiotomy: None Lacerations:  1st degree perineal Suture Repair: 2.0 vicryl Est. Blood Loss (mL):    Mom to postpartum.  Baby to Couplet care / Skin to Skin.  Jacklyn Shell 12/12/2018, 7:34 AM

## 2018-11-02 ENCOUNTER — Encounter: Payer: Self-pay | Admitting: Obstetrics and Gynecology

## 2018-11-02 ENCOUNTER — Ambulatory Visit (INDEPENDENT_AMBULATORY_CARE_PROVIDER_SITE_OTHER): Payer: 59 | Admitting: Obstetrics and Gynecology

## 2018-11-02 ENCOUNTER — Encounter: Payer: Self-pay | Admitting: Radiology

## 2018-11-02 VITALS — BP 113/76 | HR 98 | Wt 173.0 lb

## 2018-11-02 DIAGNOSIS — Z3483 Encounter for supervision of other normal pregnancy, third trimester: Secondary | ICD-10-CM

## 2018-11-02 DIAGNOSIS — Z3480 Encounter for supervision of other normal pregnancy, unspecified trimester: Secondary | ICD-10-CM

## 2018-11-02 NOTE — Progress Notes (Signed)
Prenatal Visit Note Date: 11/02/2018 Clinic: [facility]  Subjective:  Alyssa Gutierrez is a 33 y.o. [phone] at [redacted]w[redacted]d being seen today for ongoing prenatal care.  She is currently monitored for the following issues for this low-risk pregnancy and has Migraine headache; IBS (irritable bowel syndrome); Gluten-sensitive enteropathy; Supervision of other normal pregnancy, antepartum; Muscle spasm; and Pregnancy headache, antepartum on their problem list.  Patient reports no complaints.   Contractions: Not present. Vag. Bleeding: None.  Movement: Present. Denies leaking of fluid.   The following portions of the patient's history were reviewed and updated as appropriate: allergies, current medications, past family history, past medical history, past social history, past surgical history and problem list. Problem list updated.  Objective:   Vitals:   11/02/18 0836  BP: 113/76  Pulse: 98  Weight: 173 lb (78.5 kg)    Fetal Status: Fetal Heart Rate (bpm): 145 Fundal Height: 33 cm Movement: Present     General:  Alert, oriented and cooperative. Patient is in no acute distress.  Skin: Skin is warm and dry. No rash noted.   Cardiovascular: Normal heart rate noted  Respiratory: Normal respiratory effort, no problems with respiration noted  Abdomen: Soft, gravid, appropriate for gestational age. Pain/Pressure: Present     Pelvic:  Cervical exam deferred        Extremities: Normal range of motion.  Edema: None  Mental Status: Normal mood and affect. Normal behavior. Normal judgment and thought content.   Urinalysis:      Assessment and Plan:  Pregnancy: [id number] at [redacted]w[redacted]d  1. Supervision of other normal pregnancy, antepartum Routine care. Likely vasectomy. Breast (did for 65yrs last baby). I d/w her re: lactational am method.   Preterm labor symptoms and general obstetric precautions including but not limited to vaginal bleeding, contractions, leaking of fluid and fetal  movement were reviewed in detail with the patient. Please refer to After Visit Summary for other counseling recommendations.  Return in about 2 weeks (around 11/16/2018) for rob. preferably with midwife.   Barlow Bing, MD

## 2018-11-16 ENCOUNTER — Ambulatory Visit (INDEPENDENT_AMBULATORY_CARE_PROVIDER_SITE_OTHER): Payer: 59 | Admitting: Family Medicine

## 2018-11-16 VITALS — BP 121/81 | HR 108 | Wt 179.0 lb

## 2018-11-16 DIAGNOSIS — Z3480 Encounter for supervision of other normal pregnancy, unspecified trimester: Secondary | ICD-10-CM

## 2018-11-16 NOTE — Patient Instructions (Signed)

## 2018-11-16 NOTE — Progress Notes (Signed)
Pt has been getting dizzy spells

## 2018-11-17 ENCOUNTER — Encounter: Payer: Self-pay | Admitting: *Deleted

## 2018-11-17 NOTE — Progress Notes (Signed)
    PRENATAL VISIT NOTE  Subjective:  Alyssa Gutierrez is a 33 y.o. [phone] at [redacted]w[redacted]d being seen today for ongoing prenatal care.  She is currently monitored for the following issues for this low-risk pregnancy and has Migraine headache; IBS (irritable bowel syndrome); Gluten-sensitive enteropathy; Supervision of other normal pregnancy, antepartum; Muscle spasm; and Pregnancy headache, antepartum on their problem list.  Patient reports room spinning at times..  Contractions: Irregular. Vag. Bleeding: None.  Movement: Present. Denies leaking of fluid.   The following portions of the patient's history were reviewed and updated as appropriate: allergies, current medications, past family history, past medical history, past social history, past surgical history and problem list. Problem list updated.  Objective:   Vitals:   11/16/18 1619  BP: 121/81  Pulse: (!) 108  Weight: 179 lb (81.2 kg)    Fetal Status: Fetal Heart Rate (bpm): 155 Fundal Height: 32 cm Movement: Present     General:  Alert, oriented and cooperative. Patient is in no acute distress.  Skin: Skin is warm and dry. No rash noted.   Cardiovascular: Normal heart rate noted  Respiratory: Normal respiratory effort, no problems with respiration noted  Abdomen: Soft, gravid, appropriate for gestational age.  Pain/Pressure: Present     Pelvic: Cervical exam deferred        Extremities: Normal range of motion.  Edema: None  Mental Status: Normal mood and affect. Normal behavior. Normal judgment and thought content.   Assessment and Plan:  Pregnancy: [id number] at [redacted]w[redacted]d  1. Supervision of other normal pregnancy, antepartum Continue routine prenatal care. Sit before standing, stand before walking, avoid sudden head movements. Has some mild vertigo at times.  Preterm labor symptoms and general obstetric precautions including but not limited to vaginal bleeding, contractions, leaking of fluid and fetal movement were reviewed in detail  with the patient. Please refer to After Visit Summary for other counseling recommendations.  Return in 1 week (on 11/23/2018).  Future Appointments  Date Time Provider Department Center  11/22/2018  4:15 PM Calvert Cantor, [place] [facility]  11/29/2018  4:15 PM Federico Flake, MD [facility]    Reva Bores, MD

## 2018-11-22 ENCOUNTER — Ambulatory Visit (INDEPENDENT_AMBULATORY_CARE_PROVIDER_SITE_OTHER): Payer: 59 | Admitting: Advanced Practice Midwife

## 2018-11-22 VITALS — BP 126/82 | HR 97 | Wt 179.0 lb

## 2018-11-22 DIAGNOSIS — Z3483 Encounter for supervision of other normal pregnancy, third trimester: Secondary | ICD-10-CM

## 2018-11-22 DIAGNOSIS — Z3A35 35 weeks gestation of pregnancy: Secondary | ICD-10-CM

## 2018-11-22 DIAGNOSIS — Z3480 Encounter for supervision of other normal pregnancy, unspecified trimester: Secondary | ICD-10-CM

## 2018-11-22 NOTE — Progress Notes (Signed)
   PRENATAL VISIT NOTE  Subjective:  Alyssa Gutierrez is a 33 y.o. [phone] at [redacted]w[redacted]d being seen today for ongoing prenatal care.  She is currently monitored for the following issues for this low-risk pregnancy and has Migraine headache; IBS (irritable bowel syndrome); Gluten-sensitive enteropathy; Supervision of other normal pregnancy, antepartum; Muscle spasm; and Pregnancy headache, antepartum on their problem list.  Patient reports no complaints but has a slightly itchy rash within the stretch marks on her abdomen. She endorses PUPPPs with both of her previous pregnancies. She denies itching on her palms or soles of her feet.  Contractions: Irregular. Vag. Bleeding: None.  Movement: Present. Denies leaking of fluid.   The following portions of the patient's history were reviewed and updated as appropriate: allergies, current medications, past family history, past medical history, past social history, past surgical history and problem list. Problem list updated.  Objective:   Vitals:   11/22/18 1618  BP: 126/82  Pulse: 97  Weight: 81.2 kg    Fetal Status: Fetal Heart Rate (bpm): 143 Fundal Height: 37 cm Movement: Present  Presentation: Vertex  General:  Alert, oriented and cooperative. Patient is in no acute distress.  Skin: Skin is warm and dry. No rash noted.   Cardiovascular: Normal heart rate noted  Respiratory: Normal respiratory effort, no problems with respiration noted  Abdomen: Soft, gravid, appropriate for gestational age.  Pain/Pressure: Present     Pelvic: Cervical exam deferred        Extremities: Normal range of motion.  Edema: None  Mental Status: Normal mood and affect. Normal behavior. Normal judgment and thought content.   Assessment and Plan:  Pregnancy: [id number] at [redacted]w[redacted]d  1. Supervision of other normal pregnancy, antepartum --Continue routine care --GBS and GCC next visit. Discussed abx prophylaxis in event of positive GBS  --Reviewed symptom management for  PUPPPs. Patient to call clinic in event of new onset itching on her hands or feet  Preterm labor symptoms and general obstetric precautions including but not limited to vaginal bleeding, contractions, leaking of fluid and fetal movement were reviewed in detail with the patient. Please refer to After Visit Summary for other counseling recommendations.    Future Appointments  Date Time Provider Department Center  11/29/2018  4:15 PM Federico Flake, MD [facility]    Calvert Cantor, [place]

## 2018-11-29 ENCOUNTER — Encounter: Payer: Self-pay | Admitting: Family Medicine

## 2018-11-29 ENCOUNTER — Ambulatory Visit (INDEPENDENT_AMBULATORY_CARE_PROVIDER_SITE_OTHER): Payer: 59 | Admitting: Family Medicine

## 2018-11-29 VITALS — BP 129/83 | HR 98 | Wt 180.0 lb

## 2018-11-29 DIAGNOSIS — Z3A36 36 weeks gestation of pregnancy: Secondary | ICD-10-CM

## 2018-11-29 DIAGNOSIS — Z3480 Encounter for supervision of other normal pregnancy, unspecified trimester: Secondary | ICD-10-CM

## 2018-11-29 DIAGNOSIS — Z113 Encounter for screening for infections with a predominantly sexual mode of transmission: Secondary | ICD-10-CM | POA: Diagnosis not present

## 2018-11-29 DIAGNOSIS — Z3483 Encounter for supervision of other normal pregnancy, third trimester: Secondary | ICD-10-CM

## 2018-11-29 NOTE — Progress Notes (Signed)
   PRENATAL VISIT NOTE  Subjective:  Alyssa Gutierrez is a 33 y.o. [phone] at [redacted]w[redacted]d being seen today for ongoing prenatal care.  She is currently monitored for the following issues for this low-risk pregnancy and has Migraine headache; IBS (irritable bowel syndrome); Gluten-sensitive enteropathy; Supervision of other normal pregnancy, antepartum; Muscle spasm; and Pregnancy headache, antepartum on their problem list.  Patient reports nasal congestion- given resources for OTC options.  Contractions: Irregular. Vag. Bleeding: None.  Movement: Present. Denies leaking of fluid.   The following portions of the patient's history were reviewed and updated as appropriate: allergies, current medications, past family history, past medical history, past social history, past surgical history and problem list. Problem list updated.  Objective:   Vitals:   11/29/18 1621  BP: 129/83  Pulse: 98  Weight: 180 lb (81.6 kg)    Fetal Status: Fetal Heart Rate (bpm): 151 Fundal Height: 37 cm Movement: Present  Presentation: Vertex  General:  Alert, oriented and cooperative. Patient is in no acute distress.  Skin: Skin is warm and dry. No rash noted.   Cardiovascular: Normal heart rate noted  Respiratory: Normal respiratory effort, no problems with respiration noted  Abdomen: Soft, gravid, appropriate for gestational age.  Pain/Pressure: Present     Pelvic: Cervical exam performed Dilation: Fingertip Effacement (%): 50 Station: -3  Extremities: Normal range of motion.  Edema: None  Mental Status: Normal mood and affect. Normal behavior. Normal judgment and thought content.   Assessment and Plan:  Pregnancy: [id number] at [redacted]w[redacted]d  1. Supervision of other normal pregnancy, antepartum - Up to date - Attended waterbirth class, is currently looking for waterbirth tub options. She barely had time to set up tub last birth and is worried about Labor Ladies cost.  Has liner and pump. Messaged [place] to  determine [facility] tub availability - Strep Gp B NAA - GC/Chlamydia probe amp ([place])not at [facility]  Preterm labor symptoms and general obstetric precautions including but not limited to vaginal bleeding, contractions, leaking of fluid and fetal movement were reviewed in detail with the patient. Please refer to After Visit Summary for other counseling recommendations.  Return in about 1 week (around 12/06/2018).  No future appointments.  Federico Flake, MD

## 2018-12-01 LAB — STREP GP B NAA: Strep Gp B NAA: NEGATIVE

## 2018-12-01 LAB — GC/CHLAMYDIA PROBE AMP (~~LOC~~) NOT AT ARMC
Chlamydia: NEGATIVE
Neisseria Gonorrhea: NEGATIVE

## 2018-12-06 ENCOUNTER — Ambulatory Visit (INDEPENDENT_AMBULATORY_CARE_PROVIDER_SITE_OTHER): Payer: 59 | Admitting: Advanced Practice Midwife

## 2018-12-06 ENCOUNTER — Encounter: Payer: Self-pay | Admitting: *Deleted

## 2018-12-06 VITALS — BP 132/86 | HR 99 | Wt 180.0 lb

## 2018-12-06 DIAGNOSIS — Z3480 Encounter for supervision of other normal pregnancy, unspecified trimester: Secondary | ICD-10-CM

## 2018-12-06 DIAGNOSIS — Z3483 Encounter for supervision of other normal pregnancy, third trimester: Secondary | ICD-10-CM

## 2018-12-06 NOTE — Progress Notes (Signed)
   PRENATAL VISIT NOTE  Subjective:  Alyssa Gutierrez is a 33 y.o. [phone] at [redacted]w[redacted]d being seen today for ongoing prenatal care.  She is currently monitored for the following issues for this low-risk pregnancy and has Migraine headache; IBS (irritable bowel syndrome); Gluten-sensitive enteropathy; Supervision of other normal pregnancy, antepartum; Muscle spasm; and Pregnancy headache, antepartum on their problem list.  Patient reports no complaints.  Contractions: Irregular. Vag. Bleeding: None.  Movement: Present. Denies leaking of fluid.   The following portions of the patient's history were reviewed and updated as appropriate: allergies, current medications, past family history, past medical history, past social history, past surgical history and problem list. Problem list updated.  Objective:   Vitals:   12/06/18 1603  BP: 132/86  Pulse: 99  Weight: 180 lb (81.6 kg)    Fetal Status: Fetal Heart Rate (bpm): 138 Fundal Height: 38 cm Movement: Present  Presentation: Vertex  General:  Alert, oriented and cooperative. Patient is in no acute distress.  Skin: Skin is warm and dry. No rash noted.   Cardiovascular: Normal heart rate noted  Respiratory: Normal respiratory effort, no problems with respiration noted  Abdomen: Soft, gravid, appropriate for gestational age.  Pain/Pressure: Present     Pelvic: Cervical exam performed Dilation: 2 Effacement (%): 50, 60 Station: 0  Extremities: Normal range of motion.  Edema: None  Mental Status: Normal mood and affect. Normal behavior. Normal judgment and thought content.   Assessment and Plan:  Pregnancy: [id number] at [redacted]w[redacted]d  1. Supervision of other normal pregnancy, antepartum --No complaints or concerns --Reviewed signs of labor vs Deberah Pelton  --Discussed timing of Birthing Suites and MAU with move to new [facility] on 12/24/2018  Term labor symptoms and general obstetric precautions including but not limited to vaginal bleeding,  contractions, leaking of fluid and fetal movement were reviewed in detail with the patient. Please refer to After Visit Summary for other counseling recommendations.  Return in about 1 week (around 12/13/2018).  Future Appointments  Date Time Provider Department Center  12/13/2018  4:15 PM Federico Flake, MD [facility]    Calvert Cantor, [place]

## 2018-12-06 NOTE — Patient Instructions (Signed)

## 2018-12-12 ENCOUNTER — Other Ambulatory Visit: Payer: Self-pay

## 2018-12-12 ENCOUNTER — Inpatient Hospital Stay (HOSPITAL_COMMUNITY)
Admission: AD | Admit: 2018-12-12 | Discharge: 2018-12-13 | DRG: 807 | Disposition: A | Discharge status: Home / Self Care | Payer: 59 | Attending: Obstetrics and Gynecology | Admitting: Obstetrics and Gynecology

## 2018-12-12 ENCOUNTER — Encounter (HOSPITAL_COMMUNITY): Payer: Self-pay | Admitting: *Deleted

## 2018-12-12 DIAGNOSIS — Z3A38 38 weeks gestation of pregnancy: Secondary | ICD-10-CM | POA: Diagnosis not present

## 2018-12-12 DIAGNOSIS — Z3483 Encounter for supervision of other normal pregnancy, third trimester: Secondary | ICD-10-CM | POA: Diagnosis present

## 2018-12-12 DIAGNOSIS — O165 Unspecified maternal hypertension, complicating the puerperium: Secondary | ICD-10-CM

## 2018-12-12 LAB — TYPE AND SCREEN
ABO/RH(D): O POS
ANTIBODY SCREEN: NEGATIVE

## 2018-12-12 LAB — COMPREHENSIVE METABOLIC PANEL
ALK PHOS: 183 U/L — AB (ref 38–126)
ALT: 20 U/L (ref 0–44)
AST: 25 U/L (ref 15–41)
Albumin: 2.5 g/dL — ABNORMAL LOW (ref 3.5–5.0)
Anion gap: 8 (ref 5–15)
BUN: 8 mg/dL (ref 6–20)
CO2: 22 mmol/L (ref 22–32)
Calcium: 9 mg/dL (ref 8.9–10.3)
Chloride: 103 mmol/L (ref 98–111)
Creatinine, Ser: 0.69 mg/dL (ref 0.44–1.00)
GFR calc Af Amer: >60 mL/min (ref >60)
GFR calc non Af Amer: >60 mL/min (ref >60)
Glucose, Bld: 88 mg/dL (ref 70–99)
Potassium: 4.2 mmol/L (ref 3.5–5.1)
Sodium: 133 mmol/L — ABNORMAL LOW (ref 135–145)
Total Bilirubin: 1.1 mg/dL (ref 0.3–1.2)
Total Protein: 5.8 g/dL — ABNORMAL LOW (ref 6.5–8.1)

## 2018-12-12 LAB — PROTEIN / CREATININE RATIO, URINE
CREATININE, URINE: 108 mg/dL
Protein Creatinine Ratio: 0.44 mg/mg{Cre} — ABNORMAL HIGH (ref 0.00–0.15)
Total Protein, Urine: 47 mg/dL

## 2018-12-12 LAB — CBC
HCT: 41.2 % (ref 36.0–46.0)
Hemoglobin: 13.6 g/dL (ref 12.0–15.0)
MCH: 29.2 pg (ref 26.0–34.0)
MCHC: 33 g/dL (ref 30.0–36.0)
MCV: 88.6 fL (ref 80.0–100.0)
Platelets: 239 10*3/uL (ref 150–400)
RBC: 4.65 MIL/uL (ref 3.87–5.11)
RDW: 14.1 % (ref 11.5–15.5)
WBC: 11.8 10*3/uL — AB (ref 4.0–10.5)
nRBC: 0 % (ref 0.0–0.2)

## 2018-12-12 LAB — RPR: RPR Ser Ql: NONREACTIVE

## 2018-12-12 MED ORDER — ONDANSETRON HCL 4 MG PO TABS: 4.0000 mg | ORAL_TABLET | ORAL | Status: DC | PRN

## 2018-12-12 MED ORDER — OXYTOCIN 10 UNIT/ML IJ SOLN: 10.0000 [IU] | Freq: Once | INTRAMUSCULAR | Status: DC

## 2018-12-12 MED ORDER — OXYTOCIN BOLUS FROM INFUSION: 500.0000 mL | Freq: Once | INTRAVENOUS | Status: DC

## 2018-12-12 MED ORDER — BENZOCAINE-MENTHOL 20-0.5 % EX AERO
1.0000 "application " | INHALATION_SPRAY | CUTANEOUS | Status: DC | PRN
  Administered 2018-12-12: 1 via TOPICAL
  Filled 2018-12-12: qty 56

## 2018-12-12 MED ORDER — LACTATED RINGERS IV SOLN: INTRAVENOUS | Status: DC

## 2018-12-12 MED ORDER — AMLODIPINE BESYLATE 5 MG PO TABS
5.0000 mg | ORAL_TABLET | Freq: Every day | ORAL | Status: DC
  Administered 2018-12-12 – 2018-12-13 (×2): 5 mg via ORAL
  Filled 2018-12-12 (×3): qty 1

## 2018-12-12 MED ORDER — LACTATED RINGERS IV SOLN: 500.0000 mL | INTRAVENOUS | Status: DC | PRN

## 2018-12-12 MED ORDER — METHYLERGONOVINE MALEATE 0.2 MG PO TABS: 0.2000 mg | ORAL_TABLET | ORAL | Status: DC | PRN

## 2018-12-12 MED ORDER — TETANUS-DIPHTH-ACELL PERTUSSIS 5-2.5-18.5 LF-MCG/0.5 IM SUSP: 0.5000 mL | Freq: Once | INTRAMUSCULAR | Status: DC

## 2018-12-12 MED ORDER — SIMETHICONE 80 MG PO CHEW: 80.0000 mg | CHEWABLE_TABLET | ORAL | Status: DC | PRN

## 2018-12-12 MED ORDER — ACETAMINOPHEN 325 MG PO TABS
650.0000 mg | ORAL_TABLET | ORAL | Status: DC | PRN
  Administered 2018-12-12 (×2): 650 mg via ORAL
  Filled 2018-12-12 (×2): qty 2

## 2018-12-12 MED ORDER — ONDANSETRON HCL 4 MG/2ML IJ SOLN: 4.0000 mg | INTRAMUSCULAR | Status: DC | PRN

## 2018-12-12 MED ORDER — FENTANYL CITRATE (PF) 100 MCG/2ML IJ SOLN: 50.0000 ug | INTRAMUSCULAR | Status: DC | PRN

## 2018-12-12 MED ORDER — DOCUSATE SODIUM 100 MG PO CAPS
100.0000 mg | ORAL_CAPSULE | Freq: Two times a day (BID) | ORAL | Status: DC
  Administered 2018-12-12 – 2018-12-13 (×2): 100 mg via ORAL
  Filled 2018-12-12 (×2): qty 1

## 2018-12-12 MED ORDER — FERROUS SULFATE 325 (65 FE) MG PO TABS
325.0000 mg | ORAL_TABLET | Freq: Two times a day (BID) | ORAL | Status: DC
  Administered 2018-12-12 – 2018-12-13 (×2): 325 mg via ORAL
  Filled 2018-12-12 (×2): qty 1

## 2018-12-12 MED ORDER — OXYCODONE-ACETAMINOPHEN 5-325 MG PO TABS: 1.0000 | ORAL_TABLET | ORAL | Status: DC | PRN

## 2018-12-12 MED ORDER — ONDANSETRON HCL 4 MG/2ML IJ SOLN: 4.0000 mg | Freq: Four times a day (QID) | INTRAMUSCULAR | Status: DC | PRN

## 2018-12-12 MED ORDER — OXYTOCIN 10 UNIT/ML IJ SOLN
INTRAMUSCULAR | Status: AC
  Administered 2018-12-12: 10 [IU]
  Filled 2018-12-12: qty 1

## 2018-12-12 MED ORDER — OXYCODONE-ACETAMINOPHEN 5-325 MG PO TABS
2.0000 | ORAL_TABLET | ORAL | Status: DC | PRN
  Administered 2018-12-12: 2 via ORAL
  Filled 2018-12-12: qty 2

## 2018-12-12 MED ORDER — ZOLPIDEM TARTRATE 5 MG PO TABS: 5.0000 mg | ORAL_TABLET | Freq: Every evening | ORAL | Status: DC | PRN

## 2018-12-12 MED ORDER — FLEET ENEMA 7-19 GM/118ML RE ENEM: 1.0000 | ENEMA | RECTAL | Status: DC | PRN

## 2018-12-12 MED ORDER — PRENATAL MULTIVITAMIN CH
1.0000 | ORAL_TABLET | Freq: Every day | ORAL | Status: DC
  Administered 2018-12-12 – 2018-12-13 (×2): 1 via ORAL
  Filled 2018-12-12 (×2): qty 1

## 2018-12-12 MED ORDER — ACETAMINOPHEN 325 MG PO TABS: 650.0000 mg | ORAL_TABLET | ORAL | Status: DC | PRN

## 2018-12-12 MED ORDER — DIPHENHYDRAMINE HCL 25 MG PO CAPS: 25.0000 mg | ORAL_CAPSULE | Freq: Four times a day (QID) | ORAL | Status: DC | PRN

## 2018-12-12 MED ORDER — OXYCODONE HCL 5 MG PO TABS
5.0000 mg | ORAL_TABLET | ORAL | Status: DC | PRN
  Administered 2018-12-12: 5 mg via ORAL

## 2018-12-12 MED ORDER — COCONUT OIL OIL: 1.0000 "application " | TOPICAL_OIL | Status: DC | PRN

## 2018-12-12 MED ORDER — SOD CITRATE-CITRIC ACID 500-334 MG/5ML PO SOLN: 30.0000 mL | ORAL | Status: DC | PRN

## 2018-12-12 MED ORDER — BISACODYL 10 MG RE SUPP: 10.0000 mg | Freq: Every day | RECTAL | Status: DC | PRN

## 2018-12-12 MED ORDER — MEASLES, MUMPS & RUBELLA VAC IJ SOLR
0.5000 mL | Freq: Once | INTRAMUSCULAR | Status: DC
  Filled 2018-12-12: qty 0.5

## 2018-12-12 MED ORDER — WITCH HAZEL-GLYCERIN EX PADS: 1.0000 "application " | MEDICATED_PAD | CUTANEOUS | Status: DC | PRN

## 2018-12-12 MED ORDER — FLEET ENEMA 7-19 GM/118ML RE ENEM: 1.0000 | ENEMA | Freq: Every day | RECTAL | Status: DC | PRN

## 2018-12-12 MED ORDER — IBUPROFEN 600 MG PO TABS
600.0000 mg | ORAL_TABLET | Freq: Four times a day (QID) | ORAL | Status: DC
  Administered 2018-12-12 – 2018-12-13 (×5): 600 mg via ORAL
  Filled 2018-12-12 (×5): qty 1

## 2018-12-12 MED ORDER — OXYTOCIN 40 UNITS IN NORMAL SALINE INFUSION - SIMPLE MED
2.5000 [IU]/h | INTRAVENOUS | Status: DC
  Filled 2018-12-12: qty 1000

## 2018-12-12 MED ORDER — METHYLERGONOVINE MALEATE 0.2 MG/ML IJ SOLN
0.2000 mg | INTRAMUSCULAR | Status: DC | PRN
Start: 2018-12-12 — End: 2018-12-13

## 2018-12-12 MED ORDER — LIDOCAINE HCL (PF) 1 % IJ SOLN
30.0000 mL | INTRAMUSCULAR | Status: DC | PRN
  Administered 2018-12-12: 30 mL via SUBCUTANEOUS
  Filled 2018-12-12: qty 30

## 2018-12-12 MED ORDER — HYDROXYZINE HCL 50 MG PO TABS
50.0000 mg | ORAL_TABLET | Freq: Four times a day (QID) | ORAL | Status: DC | PRN
  Filled 2018-12-12: qty 1

## 2018-12-12 MED ORDER — DIBUCAINE 1 % RE OINT: 1.0000 | TOPICAL_OINTMENT | RECTAL | Status: DC | PRN

## 2018-12-12 NOTE — MAU Note (Signed)
Pain started at 9 pm 12/11/18

## 2018-12-12 NOTE — H&P (Signed)
Alyssa Gutierrez is a 33 y.o. female [phone] with IUP at 6671w5d presenting for contractions. Pt states she has been having regular, every 2-3 minutes contractions, associated with none vaginal bleeding for 8 hours..  Membranes are intact, with active fetal movement.   [facility]  Prenatal History/Complications:  2 term SVDs w/o problems  Past Medical History: Past Medical History:  Diagnosis Date  . Family history of heart murmur    at birth  . History of allergy   . History of chicken pox    age 33  . History of headache   . History of migraine   . IBS (irritable bowel syndrome)   . Nausea 03/14/2015   Around day of ovulation every month      Past Surgical History: Past Surgical History:  Procedure Laterality Date  . ROOT CANAL     age 33  . WISDOM TOOTH EXTRACTION     age 33    Obstetrical History: OB History    Gravida  4   Para  2   Term  2   Preterm  0   AB  1   Living  2     SAB  1   TAB  0   Ectopic  0   Multiple  0   Live Births  2            Social History: Social History   Socioeconomic History  . Marital status: Married    Spouse name: Not on file  . Number of children: 0  . Years of education: Not on file  . Highest education level: Not on file  Occupational History  . Occupation: Emergency planning/management officerfacilities coordinator    Employer: [employer]  Social Needs  . Financial resource strain: Not on file  . Food insecurity:    Worry: Not on file    Inability: Not on file  . Transportation needs:    Medical: Not on file    Non-medical: Not on file  Tobacco Use  . Smoking status: Never Smoker  . Smokeless tobacco: Never Used  Substance and Sexual Activity  . Alcohol use: No    Alcohol/week: 0.0 standard drinks  . Drug use: No  . Sexual activity: Yes    Partners: Male    Birth control/protection: None  Lifestyle  . Physical activity:    Days per week: Not on file    Minutes per session: Not on file  . Stress: Not on file  Relationships  .  Social connections:    Talks on phone: Not on file    Gets together: Not on file    Attends religious service: Not on file    Active member of club or organization: Not on file    Attends meetings of clubs or organizations: Not on file    Relationship status: Not on file  Other Topics Concern  . Not on file  Social History Narrative  . Not on file    Family History: Family History  Problem Relation Age of Onset  . Hypertension Father   . Diabetes Paternal Grandmother   . Heart murmur Brother   . Hypertension Paternal Uncle   . Crohn's disease Maternal Grandmother   . Colon cancer Neg Hx     Allergies: No Known Allergies  Medications Prior to Admission  Medication Sig Dispense Refill Last Dose  . acetaminophen (TYLENOL) 500 MG tablet Take 1,000 mg by mouth every 6 (six) hours as needed for mild pain, moderate  pain or headache.    Taking  . cyclobenzaprine (FLEXERIL) 10 MG tablet Take 1 tablet (10 mg total) by mouth every 8 (eight) hours as needed for muscle spasms. 60 tablet 2 Taking  . Prenatal MV-Min-Fe Fum-FA-DHA (PRENATAL 1 PO) Take by mouth.   Taking  . promethazine (PHENERGAN) 12.5 MG tablet Take 1 tablet (12.5 mg total) by mouth every 6 (six) hours as needed for nausea or vomiting. (Patient not taking: Reported on 09/13/2018) 30 tablet 0 Not Taking      Nursing Staff Provider  Office Location  [facility] Dating  LMP = 11 wl [place]  Language  English Anatomy US  WNL--low lying placenta--f/u 4 wks  Flu Vaccine  Declined -06/20/18 Genetic Screen  declined   TDaP vaccine   10/18/18 Hgb A1C or  GTT Early  Third trimester   Rhogam  n/a   LAB RESULTS   Feeding Plan  Breast Blood Type O/Positive/-- (07/24 1614)   Contraception Considering BTL vs IUD Antibody Negative (07/24 1614)  Circumcision  No  Rubella 1.32 (07/24 1614)  Pediatrician   [facility] RPR Non Reactive (07/24 1614)   Support Person  Ayaan Goodell - Spouse HBsAg Negative (07/24 1614)   Prenatal  Classes  No HIV Non Reactive (07/24 1614)  BTL Consent  GBS  (For PCN allergy, check sensitivities)   VBAC Consent  Pap  12/30/15    Hgb Electro      CF  negative/ low risk    SMA  2 copies, low risk    Waterbirth  [ x] Class [ ]  Consent [x ] CNM visit    Review of Systems   Constitutional: Negative for fever and chills Eyes: Negative for visual disturbances Respiratory: Negative for shortness of breath, dyspnea Cardiovascular: Negative for chest pain or palpitations  Gastrointestinal: Negative for vomiting, diarrhea and constipation.  POSITIVE for abdominal pain (contractions) Genitourinary: Negative for dysuria and urgency Musculoskeletal: Negative for back pain, joint pain, myalgias  Neurological: Negative for dizziness and headaches      Blood pressure 139/81, pulse 87, temperature 98.3 F (36.8 C), temperature source Axillary, last menstrual period 03/16/2018, currently breastfeeding. General appearance: alert, cooperative and no distress Lungs: clear to auscultation bilaterally Heart: regular rate and rhythm Abdomen: soft, non-tender; bowel sounds normal Extremities: Homans sign is negative, no sign of DVT DTR's 2+ Presentation: cephalic Fetal monitoring  Baseline: 135 bpm, Variability: Good {> 6 bpm), Accelerations: Reactive and Decelerations: Absent Uterine activity  2-3 Dilation: 7 Effacement (%): 80 Station: -1 Exam by:: Scientist, product/process development RN   Prenatal labs: ABO, Rh: O/Positive/-- (07/24 1614) Antibody: Negative (07/24 1614) Rubella: 1.32 (07/24 1614) RPR: Non Reactive (12/04 0847)  HBsAg: Negative (07/24 1614)  HIV: Non Reactive (12/04 0847)  GBS: Negative (01/29 1651)    Prenatal Transfer Tool  Maternal Diabetes: No Genetic Screening: Normal Maternal Ultrasounds/Referrals: Normal Fetal Ultrasounds or other Referrals:  None Maternal Substance Abuse:  No Significant Maternal Medications:  None Significant Maternal Lab Results: Lab values include: Group B  Strep negative     No results found for this or any previous visit (from the past 24 hour(s)).  Assessment: Alyssa Gutierrez is a 33 y.o. [phone] with an IUP at [redacted]w[redacted]d presenting for laboar  Plan: #Labor: expectant management #Pain:  Per request #FWB Cat 1   Jacklyn Shell 12/12/2018, 6:38 AM

## 2018-12-12 NOTE — Discharge Summary (Signed)
Postpartum Discharge Summary     Patient Name: Alyssa HamiltonSarah B Wager DOB: 1986-05-02 MRN: [id number]  Date of admission: 12/12/2018 Delivering Provider: Jacklyn ShellRESENZO-DISHMON, FRANCES   Date of discharge: 12/13/2018  Admitting diagnosis: 38.5WKS CTX Intrauterine pregnancy: 8060w5d     Secondary diagnosis:  Active Problems:   Normal labor   Postpartum hypertension   NSVD (normal spontaneous vaginal delivery)  Additional problems: Postpartum HTN     Discharge diagnosis: Term Pregnancy Delivered                                                                                                Post partum procedures:Started on Norvasc 5 mg daily  Augmentation: AROM  Complications: None  Hospital course:  Onset of Labor With Vaginal Delivery     33 y.o. yo [id number] at 6760w5d was admitted in Active Labor on 12/12/2018. Patient had an uncomplicated labor course as follows:  Membrane Rupture Time/Date: 6:55 AM ,12/12/2018   Intrapartum Procedures: Episiotomy: None [1]                                         Lacerations:  1st degree [2];Perineal [11]  Patient had a delivery of a Viable infant. 12/12/2018  Information for the patient's newborn:  Brandy HaleSpringer, Girl Maralyn SagoSarah [[id number]][[id number]]  Delivery Method: Vag-Spont    Pateint had an uncomplicated postpartum course.  She is ambulating, tolerating a regular diet, passing flatus, and urinating well. Patient is discharged home in stable condition on 12/13/18.   Magnesium Sulfate recieved: No BMZ received: Yes  Physical exam  Vitals:   12/12/18 2248 12/12/18 2349 12/13/18 0529 12/13/18 0850  BP: (!) 159/99 (!) 157/97 132/90 124/90  Pulse: 90  82 82  Resp: 18  17 16   Temp: 98.4 F (36.9 C)  98.4 F (36.9 C) 97.7 F (36.5 C)  TempSrc: Oral  Oral Oral  SpO2:       General: alert, cooperative and no distress Lochia: appropriate Uterine Fundus: firm Incision: N/A DVT Evaluation: No evidence of DVT seen on physical exam. Labs: Lab Results  Component  Value Date   WBC 11.8 (H) 12/12/2018   HGB 13.6 12/12/2018   HCT 41.2 12/12/2018   MCV 88.6 12/12/2018   PLT 239 12/12/2018   CMP Latest Ref Rng & Units 12/12/2018  Glucose 70 - 99 mg/dL 88  BUN 6 - 20 mg/dL 8  Creatinine 1.300.44 - 8.651.00 mg/dL 7.840.69  Sodium 696135 - 295145 mmol/L 133(L)  Potassium 3.5 - 5.1 mmol/L 4.2  Chloride 98 - 111 mmol/L 103  CO2 22 - 32 mmol/L 22  Calcium 8.9 - 10.3 mg/dL 9.0  Total Protein 6.5 - 8.1 g/dL 2.8(U5.8(L)  Total Bilirubin 0.3 - 1.2 mg/dL 1.1  Alkaline Phos 38 - 126 U/L 183(H)  AST 15 - 41 U/L 25  ALT 0 - 44 U/L 20    Discharge instruction: per After Visit Summary and "Baby and Me Booklet".  After visit meds:  Allergies as of 12/13/2018   No Known Allergies  Medication List    TAKE these medications   amLODipine 5 MG tablet Commonly known as:  NORVASC Take 1 tablet (5 mg total) by mouth daily. Start taking on:  December 14, 2018   ibuprofen 600 MG tablet Commonly known as:  ADVIL,MOTRIN Take 1 tablet (600 mg total) by mouth every 6 (six) hours.   PRENATAL 1 PO Take by mouth.       Diet: routine diet  Activity: Advance as tolerated. Pelvic rest for 6 weeks.   Outpatient follow up:Baby love RN visit on 1/14 and visit in office in 1 week for BP check. Follow up Appt: Future Appointments  Date Time Provider Department Center  01/10/2019 10:45 AM Calvert Cantor, CNM [facility]   Follow up Visit: Follow-up Information    [facility] Follow up.   Specialty:  Obstetrics and Gynecology Why:  In 1 week for BP check, in 6 weeks for postpartum visit. Contact information: [address] [place] [address] [phone]           Please schedule this patient for Postpartum visit in: 4 weeks with the following provider: APP For C/S patients schedule nurse incision check in weeks 2 weeks:  Low risk pregnancy complicated by:  Delivery mode:  SVD Anticipated Birth  Control:  POPs PP Procedures needed:   Schedule Integrated BH visit: no      Newborn Data: Live born female  Birth Weight:   APGAR: 9, 9  Newborn Delivery   Birth date/time:  12/12/2018 07:00:00 Delivery type:  Vaginal, Spontaneous     Baby Feeding: Breast Disposition:home with mother   12/13/2018 Sharen Counter, CNM

## 2018-12-12 NOTE — Lactation Note (Signed)
This note was copied from a baby's chart. Lactation Consultation Note  Patient Name: Girl Roena Postlewaite OINOM'V Date: 12/12/2018 Reason for consult: Initial assessment;Early term 37-38.6wks  P3 mother whose infant is now 71 hours old.  Mother breast fed her 2 other children and stopped with the last one to "give myself a break" before delivering this child.  Her children and 78 and 32 years old.    Mother had no questions/concerns related to breast feeding.  She stated that baby has latched and fed well since delivery.  Encouraged her to feed 8-12 times/24 hours or sooner if baby shows feeding cues.  Mother is familiar with feeding cues and hand expression.  Colostrum container provided and milk storage times reviewed.  Finger feeding demonstrated.    Mother has 2 DEBPs for home use.  She will return to work in 16 weeks.  Encouraged mother to call for lactation assistance as needed.  Both parents seem very receptive to learning and mother seems confident in her breast feeding ability.   Maternal Data Formula Feeding for Exclusion: No Has patient been taught Hand Expression?: Yes Does the patient have breastfeeding experience prior to this delivery?: Yes  Feeding Feeding Type: Breast Fed  LATCH Score Latch: Grasps breast easily, tongue down, lips flanged, rhythmical sucking.  Audible Swallowing: Spontaneous and intermittent  Type of Nipple: Everted at rest and after stimulation  Comfort (Breast/Nipple): Soft / non-tender  Hold (Positioning): Assistance needed to correctly position infant at breast and maintain latch.  LATCH Score: 9  Interventions    Lactation Tools Discussed/Used     Consult Status Consult Status: Follow-up Date: 12/13/18 Follow-up type: In-patient    Jovi Alvizo R Henry Utsey 12/12/2018, 10:22 AM

## 2018-12-13 ENCOUNTER — Encounter: Payer: 59 | Admitting: Family Medicine

## 2018-12-13 DIAGNOSIS — O165 Unspecified maternal hypertension, complicating the puerperium: Secondary | ICD-10-CM | POA: Diagnosis not present

## 2018-12-13 MED ORDER — IBUPROFEN 600 MG PO TABS: 600.0000 mg | ORAL_TABLET | Freq: Four times a day (QID) | ORAL | 0 refills | Status: DC

## 2018-12-13 MED ORDER — AMLODIPINE BESYLATE 5 MG PO TABS: 5.0000 mg | ORAL_TABLET | Freq: Every day | ORAL | 2 refills | Status: DC

## 2018-12-13 NOTE — Progress Notes (Signed)
Post Partum Day 1 Subjective: no complaints, up ad lib, voiding, tolerating PO, + flatus and Patient was pleasant and cooperative. She denies expereincing HA, SOB, Chx pain, blurry vision or fatigue. She has been eat and drinking without diffiuclty denies Nausea or vomiting. She admits to miminal cramping and bleeding.  Objective: Blood pressure 132/90, pulse 82, temperature 98.4 F (36.9 C), temperature source Oral, resp. rate 17, last menstrual period 03/16/2018, SpO2 99 %, unknown if currently breastfeeding.  Physical Exam:  General: alert, cooperative, appears stated age and no distress Lochia: appropriate Uterine Fundus: firm Incision: N/A DVT Evaluation: No evidence of DVT seen on physical exam. Lung clear bilaterally, normal heart tones no pedal edema   Recent Labs    12/12/18 0555  HGB 13.6  HCT 41.2    Assessment/Plan: Plan for discharge tomorrow, Breastfeeding and Contraception OC pills   LOS: 1 day   Alyssa Gutierrez 12/13/2018, 8:57 AM

## 2018-12-18 ENCOUNTER — Inpatient Hospital Stay (HOSPITAL_BASED_OUTPATIENT_CLINIC_OR_DEPARTMENT_OTHER)
Admit: 2018-12-18 | Discharge: 2018-12-18 | Disposition: A | Discharge status: Home / Self Care | Payer: 59 | Attending: Student | Admitting: Student

## 2018-12-18 ENCOUNTER — Other Ambulatory Visit: Payer: Self-pay

## 2018-12-18 ENCOUNTER — Inpatient Hospital Stay (HOSPITAL_COMMUNITY)
Admission: AD | Admit: 2018-12-18 | Discharge: 2018-12-18 | Disposition: A | Discharge status: Home / Self Care | Payer: 59 | Attending: Obstetrics & Gynecology | Admitting: Obstetrics & Gynecology

## 2018-12-18 ENCOUNTER — Telehealth: Payer: Self-pay | Admitting: *Deleted

## 2018-12-18 DIAGNOSIS — O9089 Other complications of the puerperium, not elsewhere classified: Secondary | ICD-10-CM | POA: Insufficient documentation

## 2018-12-18 DIAGNOSIS — M79605 Pain in left leg: Secondary | ICD-10-CM | POA: Diagnosis present

## 2018-12-18 DIAGNOSIS — R52 Pain, unspecified: Secondary | ICD-10-CM | POA: Diagnosis not present

## 2018-12-18 DIAGNOSIS — R51 Headache: Secondary | ICD-10-CM | POA: Diagnosis not present

## 2018-12-18 DIAGNOSIS — M79662 Pain in left lower leg: Secondary | ICD-10-CM | POA: Diagnosis not present

## 2018-12-18 NOTE — Progress Notes (Signed)
Left lower extremity venous duplex has been completed. Negative for DVT. Results were given to Judeth Horn NP.  12/18/18 7:12 PM Olen Cordial RVT

## 2018-12-18 NOTE — MAU Provider Note (Signed)
History     CSN: [id number]  Arrival date and time: 12/18/18 7948   First Provider Initiated Contact with Patient 12/18/18 1919      Chief Complaint  Patient presents with  . Leg Pain   HPI Alyssa Gutierrez 33 y.o. Postpartum since 12-12-18.  Having pain in Left lower calf today in 3 places on her leg.  Radiology came and did scan to look for DVT  - result was negative.  Client has a headache today but does have problems with headaches and has not taken her medication today for her headache.  Is currently on Norvasc and is taking it.  Has a scheduled appointment in the clinic for BP check again in 2 days.  OB History    Gravida  4   Para  3   Term  3   Preterm  0   AB  1   Living  3     SAB  1   TAB  0   Ectopic  0   Multiple  0   Live Births  3           Past Medical History:  Diagnosis Date  . Family history of heart murmur    at birth  . History of allergy   . History of chicken pox    age 53  . History of headache   . History of migraine   . IBS (irritable bowel syndrome)   . Nausea 03/14/2015   Around day of ovulation every month      Past Surgical History:  Procedure Laterality Date  . ROOT CANAL     age 71  . WISDOM TOOTH EXTRACTION     age 68    Family History  Problem Relation Age of Onset  . Hypertension Father   . Diabetes Paternal Grandmother   . Heart murmur Brother   . Hypertension Paternal Uncle   . Crohn's disease Maternal Grandmother   . Colon cancer Neg Hx     Social History   Tobacco Use  . Smoking status: Never Smoker  . Smokeless tobacco: Never Used  Substance Use Topics  . Alcohol use: No    Alcohol/week: 0.0 standard drinks  . Drug use: No    Allergies: No Known Allergies  Medications Prior to Admission  Medication Sig Dispense Refill Last Dose  . amLODipine (NORVASC) 5 MG tablet Take 1 tablet (5 mg total) by mouth daily. 30 tablet 2   . ibuprofen (ADVIL,MOTRIN) 600 MG tablet Take 1 tablet (600 mg  total) by mouth every 6 (six) hours. 30 tablet 0   . Prenatal MV-Min-Fe Fum-FA-DHA (PRENATAL 1 PO) Take by mouth.   Taking    Review of Systems  Constitutional: Negative for fever.  Eyes: Negative for visual disturbance.  Cardiovascular: Negative for leg swelling.  Musculoskeletal:       Pain in left lower leg  Neurological: Positive for headaches. Negative for dizziness and tremors.   Physical Exam   Blood pressure (!) 138/94, pulse 87, temperature 98.5 F (36.9 C), temperature source Oral, resp. rate 18, SpO2 100 %, currently breastfeeding.  Physical Exam  Nursing note and vitals reviewed. Constitutional: She is oriented to person, place, and time. She appears well-developed and well-nourished. No distress.  HENT:  Head: Normocephalic.  Eyes: EOM are normal.  Neck: Neck supple.  Cardiovascular: Normal rate and regular rhythm.  Respiratory: Effort normal. No respiratory distress.  Musculoskeletal: Normal range of motion.  General: No edema.     Comments: Tenderness on inner side of left lower leg.  No red areas and no swelling.  Scan for DVT is negative.  Neurological: She is alert and oriented to person, place, and time.  Skin: Skin is warm and dry.  Psychiatric: She has a normal mood and affect.   Vitals:   12/18/18 1826 12/18/18 1911 12/18/18 1923  BP: (!) 137/99 131/89 (!) 138/94  Pulse: 77 87   Resp: 18    Temp: 98.5 F (36.9 C)    TempSrc: Oral    SpO2: 100%      MAU Course  Procedures  MDM Keep appointment as scheduled.  Do not think this is a DVT.  BPS are borderline but currently on Norvasc and taking medication as prescribed.  Headache is common for her and do not think it is a related neurological finding.  Has not taken her usual medication for her headache.  Do not think there is an exacerbation of postpartum preeclampsia.  Assessment and Plan  Left calf pain - no evidence of DVT found  Plan Return if your symptoms worsen. There is no  evidence of a blood clot in your left lower leg. Wear compression knee high socks Drink at least 8 8-oz glasses of water every day. Can take Ibuprofen for pain. Keep your appointment in 2 days. Continue to take your blood pressure medication.  Hang Ammon L Janille Draughon 12/18/2018, 7:25 PM

## 2018-12-18 NOTE — MAU Note (Signed)
Having some tenderness, started in ankle a few days ago,  Is increasing and moving up inner calf of left leg.  Vag del 2/11.elevated BP, is on meds for that.

## 2018-12-18 NOTE — Telephone Encounter (Signed)
Pt called concerned about having some pain  In her lower leg that started in her ankle and has moved to up. Pt denies any redness, swelling, or color changes in leg. Pt had uncomplicated SVD. Spoke with Dr Shawnie Pons in regards to pt symptoms, pt needs to go to MAU to be evaluated.

## 2018-12-18 NOTE — MAU Note (Addendum)
Also reports a HA, has  Not taken anything for HA. Denies visual changes, denies RUQ pain at this time. No changes in swelling.  Has not taken BP med today

## 2018-12-18 NOTE — Discharge Instructions (Signed)
Return if your symptoms worsen. There is no evidence of a blood clot in your left lower leg. Wear compression knee high socks Drink at least 8 8-oz glasses of water every day. Can take Ibuprofen for pain. Keep your appointment in 2 days. Continue to take your blood pressure medication.

## 2018-12-20 ENCOUNTER — Ambulatory Visit (INDEPENDENT_AMBULATORY_CARE_PROVIDER_SITE_OTHER): Payer: 59 | Admitting: *Deleted

## 2018-12-20 VITALS — BP 129/85 | HR 93

## 2018-12-20 DIAGNOSIS — O165 Unspecified maternal hypertension, complicating the puerperium: Secondary | ICD-10-CM

## 2018-12-20 NOTE — Progress Notes (Signed)
ATTESTATION OF SUPERVISION OF RN: Evaluation and management procedures were performed by the RN under my supervision and collaboration. I have reviewed the nursing note and chart and agree with the management and plan for this patient.  Samantha Weinhold, CNM  

## 2018-12-20 NOTE — Progress Notes (Signed)
Subjective:  Alyssa Gutierrez is a 33 y.o. female here for BP check.   Hypertension ROS: taking medications as instructed, no medication side effects noted, no TIA's, no chest pain on exertion, no dyspnea on exertion and no swelling of ankles.    Objective:  BP 129/85   Pulse 93   Appearance alert, well appearing, and in no distress. General exam BP noted to be well controlled today in office.    Assessment:   Blood Pressure well controlled.   Plan:  Follow up: 3 weeks and as needed.Marland Kitchen

## 2019-01-09 ENCOUNTER — Ambulatory Visit: Payer: 59 | Admitting: Obstetrics and Gynecology

## 2019-01-09 NOTE — Progress Notes (Signed)
Post Partum Exam  Alyssa Gutierrez is a 33 y.o. [phone] female who presents for a postpartum visit. She is four weeks postpartum following a vaginal delivery:. I have fully reviewed the prenatal and intrapartum course. The delivery was at 38.5 gestational weeks.  Anesthesia: none. Postpartum course has been uncomplicated. Baby's course has been uncomplicated. Baby is feeding by breast. Bleeding staining only. Bowel function is normal. Bladder function is normal. Patient is not sexually active. Contraception method is condoms. Postpartum depression screening:neg  The following portions of the patient's history were reviewed and updated as appropriate: allergies, current medications, past family history, past medical history, past social history, past surgical history and problem list. Last pap smear done 12/30/2015 and was Normal  Review of Systems A comprehensive review of systems was negative.    Objective:  currently breastfeeding.  General:  alert, cooperative and no distress   Breasts:  inspection negative, no nipple discharge or bleeding, no masses or nodularity palpable  Heart:  regular rate and rhythm, S1, S2 normal, no murmur, click, rub or gallop  Abdomen: soft, non-tender; bowel sounds normal; no masses,  no organomegaly   Vulva:  normal  Vagina: normal vagina, no discharge, exudate, lesion, or erythema Small amount of suture visible at introitus.   Cervix:  no bleeding following Pap, no cervical motion tenderness and no lesions  Corpus: not examined  Adnexa:  not evaluated  Rectal Exam: Not performed.        Assessment:   Normal postpartum exam. Normotensive. Pap smear done at today's visit.   Plan:   1. Contraception: none, possible partner vasectomy 2. Follow up in: 1 year or as needed.   Clayton Bibles [facility]

## 2019-01-10 ENCOUNTER — Ambulatory Visit (INDEPENDENT_AMBULATORY_CARE_PROVIDER_SITE_OTHER): Payer: 59 | Admitting: Advanced Practice Midwife

## 2019-01-10 ENCOUNTER — Encounter: Payer: Self-pay | Admitting: Advanced Practice Midwife

## 2019-01-10 ENCOUNTER — Other Ambulatory Visit: Payer: Self-pay

## 2019-01-10 DIAGNOSIS — Z124 Encounter for screening for malignant neoplasm of cervix: Secondary | ICD-10-CM

## 2019-01-10 DIAGNOSIS — Z1151 Encounter for screening for human papillomavirus (HPV): Secondary | ICD-10-CM

## 2019-01-12 LAB — CYTOLOGY - PAP
Diagnosis: NEGATIVE
HPV: NOT DETECTED

## 2019-02-12 ENCOUNTER — Other Ambulatory Visit: Payer: Self-pay

## 2019-02-12 ENCOUNTER — Encounter: Payer: Self-pay | Admitting: Family Medicine

## 2019-02-12 ENCOUNTER — Ambulatory Visit (INDEPENDENT_AMBULATORY_CARE_PROVIDER_SITE_OTHER): Payer: 59 | Admitting: Family Medicine

## 2019-02-12 ENCOUNTER — Ambulatory Visit (INDEPENDENT_AMBULATORY_CARE_PROVIDER_SITE_OTHER)
Admission: RE | Admit: 2019-02-12 | Discharge: 2019-02-12 | Disposition: A | Discharge status: Home / Self Care | Payer: 59 | Source: Ambulatory Visit | Attending: Family Medicine | Admitting: Family Medicine

## 2019-02-12 VITALS — BP 118/90 | HR 89 | Temp 98.6°F | Ht 63.0 in | Wt 155.2 lb

## 2019-02-12 DIAGNOSIS — S92502A Displaced unspecified fracture of left lesser toe(s), initial encounter for closed fracture: Secondary | ICD-10-CM

## 2019-02-12 DIAGNOSIS — M79672 Pain in left foot: Secondary | ICD-10-CM

## 2019-02-12 DIAGNOSIS — M7989 Other specified soft tissue disorders: Secondary | ICD-10-CM

## 2019-02-12 DIAGNOSIS — M79675 Pain in left toe(s): Secondary | ICD-10-CM | POA: Diagnosis not present

## 2019-02-12 NOTE — Progress Notes (Signed)
Alyssa Gutierrez T. Alyssa Cadenhead, MD Primary Care and Sports Medicine [facility] [address] [place], [address] Phone: [phone]  FAX: [phone]  JOURNYE Alyssa Gutierrez - 33 y.o. female  MRN [id number]  Date of Birth: 10/14/1986  Visit Date: 02/12/2019  PCP: Judy Pimple, MD  Referred by: Tower, Audrie Gallus, MD  Chief Complaint  Patient presents with  . Foot Injury    Left Pinky Toe   Subjective:   Alyssa Gutierrez is a 33 y.o. very pleasant female patient who presents with the following:  DOI 02/08/2019  She is a very nice young lady.  She struck her bed frame on the above date.  At that point she had very significant pain and she has been nonambulatory since that time.  Her husband got her some crutches, and she has been nonweightbearing using crutches.  She does have a 58-week-old at home, along with a 37-year-old and 22-year-old.  She does have some bruising and swelling throughout her forefoot and greatest at the fifth phalanx and metatarsal.  No prior trauma or surgery significant in this region of the foot.  Bed frame, hit with foot NWB since then and on crutches BF currently.    5th MT and toe.   Past Medical History, Surgical History, Social History, Family History, Problem List, Medications, and Allergies have been reviewed and updated if relevant.  Patient Active Problem List   Diagnosis Date Noted  . 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 NSVD (normal spontaneous vaginal delivery) 12/13/2018  . Muscle spasm 07/28/2018  . 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 IBS (irritable bowel syndrome) 09/24/2014  . Migraine headache 03/19/2013    Past Medical History:  Diagnosis Date  . Family history of heart murmur    at birth  . History of allergy   . History of chicken pox    age 4  . History of headache   . History of migraine   . IBS (irritable bowel syndrome)   . Nausea 03/14/2015   Around day of ovulation every month     . Normal labor 12/12/2018  . Supervision of other normal pregnancy, antepartum 05/23/2018    Nursing Staff Provider Office Location  [facility] Dating  LMP = 11 wl [place] Language  English Anatomy US  WNL--low lying placenta--f/u 4 wks Flu Vaccine  Declined -06/20/18 Genetic Screen  declined  TDaP vaccine   10/18/18 Hgb A1C or  GTT Early  Third trimester  Rhogam  n/a   LAB RESULTS  Feeding Plan  Breast Blood Type O/Positive/-- (07/24 1614)  Contraception Considering BTL vs IUD Antibody Negative (    Past Surgical History:  Procedure Laterality Date  . ROOT CANAL     age 46  . WISDOM TOOTH EXTRACTION     age 52    Social History   Socioeconomic History  . Marital status: Married    Spouse name: Not on file  . Number of children: 0  . Years of education: Not on file  . Highest education level: Not on file  Occupational History  . Occupation: Emergency planning/management officer: [employer]  Social Needs  . Financial resource strain: Not on file  . Food insecurity:    Worry: Not on file    Inability: Not on file  . Transportation needs:    Medical: Not on file    Non-medical: Not on file  Tobacco Use  . Smoking status: Never Smoker  . Smokeless  tobacco: Never Used  Substance and Sexual Activity  . Alcohol use: No    Alcohol/week: 0.0 standard drinks  . Drug use: No  . Sexual activity: Yes    Partners: Male    Birth control/protection: None  Lifestyle  . Physical activity:    Days per week: Not on file    Minutes per session: Not on file  . Stress: Not on file  Relationships  . Social connections:    Talks on phone: Not on file    Gets together: Not on file    Attends religious service: Not on file    Active member of club or organization: Not on file    Attends meetings of clubs or organizations: Not on file    Relationship status: Not on file  . Intimate partner violence:    Fear of current or ex partner: Not on file    Emotionally abused: Not on file    Physically abused: Not  on file    Forced sexual activity: Not on file  Other Topics Concern  . Not on file  Social History Narrative  . Not on file    Family History  Problem Relation Age of Onset  . Hypertension Father   . Diabetes Paternal Grandmother   . Heart murmur Brother   . Hypertension Paternal Uncle   . Crohn's disease Maternal Grandmother   . Colon cancer Neg Hx     No Known Allergies  Medication list reviewed and updated in full in [place] Link.  GEN: No fevers, chills. Nontoxic. Primarily MSK c/o today. MSK: Detailed in the HPI GI: tolerating PO intake without difficulty Neuro: No numbness, parasthesias, or tingling associated. Otherwise the pertinent positives of the ROS are noted above.   Objective:   BP 118/90   Pulse 89   Temp 98.6 F (37 C) (Oral)   Ht  (1.6 m)   Wt 155 lb 4 oz (70.4 kg)   BMI 27.50 kg/m    GEN: WDWN, NAD, Non-toxic, Alert & Oriented x 3 HEENT: Atraumatic, Normocephalic.  Ears and Nose: No external deformity. EXTR: No clubbing/cyanosis NEURO: NONAMBULATORY PSYCH: Normally interactive. Conversant. Not depressed or anxious appearing.  Calm demeanor.    The entirety of the entire right foot and ankle as well as the left foot and ankle was examined.  Right foot and ankle is completely benign with entirely nontender bony anatomy.  Left ankle is nontender at the malleoli.  The tibia and fibula are nontender throughout.  Anterior drawer testing is negative.  Base of the fifth metatarsal is negative.  Talus is nontender.  The cuboid is nontender, navicular is nontender, and the entirety of the midfoot is nontender.  Metatarsal shafts 5, 4, 3 are nontender as well as the first metatarsal shaft.  Phalanges 1, 3, 4, and 5 are nontender and freely move.  There is global swelling in the forefoot with some fairly significant bruising throughout the lateral aspect of the foot and forefoot.  Patient has got very significant tenderness in the fifth toe and  she also has pain in the fifth MTP joint as well as the proximal fifth phalanx.  Radiology: Dg Foot Complete Left  Result Date: 02/12/2019 CLINICAL DATA:  Trauma on 02/08/2019.  Evaluate for fracture EXAM: LEFT FOOT - COMPLETE 3+ VIEW COMPARISON:  None. FINDINGS: There is an acute obliquely oriented, minimally displaced fracture involving the base of the proximal phalanx of the fifth digit with extension to the adjacent MCP joint.  Expected adjacent soft tissue swelling.  No radiopaque foreign body. No additional fractures identified. Joint spaces are preserved. No significant hallux valgus deformity. No erosions. No plantar calcaneal spur. Note is made of a prominent os tibialis externum. IMPRESSION: Acute obliquely oriented minimally displaced fracture involving the base of the proximal phalanx of the fifth digit with potential minimal intra-articular extension Electronically Signed   By: Simonne ComeJohn  Watts M.D.   On: 02/12/2019 17:32     Assessment and Plan:   Closed fracture of phalanx of left fifth toe, initial encounter  Acute foot pain, left - Plan: DG Foot Complete Left  Pain and swelling of toe, left - Plan: DG Foot Complete Left  >25 minutes spent in face to face time with patient, >50% spent in counselling or coordination of care   I reviewed the patient's x-rays with her face-to-face in the office.  There is no involvement of the metatarsal head on the fifth.  With the exception of the fifth toe, there is no fracture evident. There is an acute fracture with minimal displacement at the base of the proximal fifth toe.  There is mild angulation.  Minimal intra-articular involvement.Electronically Signed  By: Hannah BeatSpencer Trude Cansler, MD On: 02/12/2019 7:10 PM   Patient should do well with conservative management.  She is nonweightbearing currently, so I placed her in a short Aircast fracture boot.  I also buddy taped her fourth and fifth toes together and gave her some athletic tape. She walked better  in the office in the CAM boot.   Patient placed in a pneumatic compression-type splint, which has been shown to decrease overall time of rehab, swelling, and faster return to full function.   I appreciate the opportunity to evaluate this very friendly patient. If you have any question regarding her care or prognosis, do not hesitate to ask.   Follow-up: Return for 2-3 weeks fracture follow-up.  Orders Placed This Encounter  Procedures  . DG Foot Complete Left    Signed,  Daelan Gatt T. Nickoles Gregori, MD   Outpatient Encounter Medications as of 02/12/2019  Medication Sig  . Prenatal MV-Min-Fe Fum-FA-DHA (PRENATAL 1 PO) Take by mouth.  . [DISCONTINUED] amLODipine (NORVASC) 5 MG tablet Take 1 tablet (5 mg total) by mouth daily.  . [DISCONTINUED] ibuprofen (ADVIL,MOTRIN) 600 MG tablet Take 1 tablet (600 mg total) by mouth every 6 (six) hours.   No facility-administered encounter medications on file as of 02/12/2019.

## 2019-02-22 ENCOUNTER — Telehealth: Payer: Self-pay

## 2019-02-22 NOTE — Telephone Encounter (Signed)
I spoke to patient and she accidentally cancelled her appointment on the tele minder.  I put the appointment back on the schedule.

## 2019-02-22 NOTE — Telephone Encounter (Signed)
[facility] Night - Client Nonclinical Telephone Record [facility] Client [facility] Night - Client Client Site [facility] - Night Physician Hannah Beat - MD Contact Type Call Who Is Calling Patient / Member / Family / Caregiver Caller Name Aamirah Snarr Caller Phone Number [phone] Patient Name Alyssa Gutierrez Patient DOB 15-Feb-1986 Call Type Message Only Information Provided Reason for Call Request for General Office Information Initial Comment Caller states that she needs to confirm her appointment for Monday at 2:45. Additional Comment Call Closed By: Jairo Ben Transaction Date/Time: 02/21/2019 5:34:13 PM (ET)

## 2019-02-25 NOTE — Progress Notes (Signed)
Alyssa Prehn T. Daevion [name], MD Primary Care and Sports Medicine [facility] at [facility] [address] [place], [address] Phone: [phone]  FAX: [phone]  [name] - 33 y.o. female  MRN [id number]  Date of Birth: 04-Aug-1986  Visit Date: 02/26/2019  PCP: Judy Pimple, MD  Referred by: Tower, Audrie Gallus, MD  Chief Complaint  Patient presents with  . Follow-up    Toe Fx   Subjective:   Alyssa Gutierrez is a 33 y.o. very pleasant female patient who presents with the following:  DOI 02/08/2019  Pleasant lady who had a significant 5th toe fx and was having a lot of pain at initial exam.  F/u L 5th toe fx: She is really doing a lot better.  She does have some pain particular with taking care of her children, but she is wearing her cam walker boot when taking care of them.  She is able to go without in her own bedroom bathroom, etc.  She has been taping the fourth and fifth toes together, she thinks this does help.    Past Medical History, Surgical History, Social History, Family History, Problem List, Medications, and Allergies have been reviewed and updated if relevant.  Patient Active Problem List   Diagnosis Date Noted  . 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 NSVD (normal spontaneous vaginal delivery) 12/13/2018  . Muscle spasm 07/28/2018  . 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 IBS (irritable bowel syndrome) 09/24/2014  . Migraine headache 03/19/2013    Past Medical History:  Diagnosis Date  . Family history of heart murmur    at birth  . History of allergy   . History of chicken pox    age 89  . History of headache   . History of migraine   . IBS (irritable bowel syndrome)   . Nausea 03/14/2015   Around day of ovulation every month    . Normal labor 12/12/2018  . Supervision of other normal pregnancy, antepartum 05/23/2018    Nursing Staff Provider Office Location  [facility] Dating  LMP = 11 wl [place] Language  English Anatomy US   WNL--low lying placenta--f/u 4 wks Flu Vaccine  Declined -06/20/18 Genetic Screen  declined  TDaP vaccine   10/18/18 Hgb A1C or  GTT Early  Third trimester  Rhogam  n/a   LAB RESULTS  Feeding Plan  Breast Blood Type O/Positive/-- (07/24 1614)  Contraception Considering BTL vs IUD Antibody Negative (    Past Surgical History:  Procedure Laterality Date  . ROOT CANAL     age 50  . WISDOM TOOTH EXTRACTION     age 41    Social History   Socioeconomic History  . Marital status: Married    Spouse name: Not on file  . Number of children: 0  . Years of education: Not on file  . Highest education level: Not on file  Occupational History  . Occupation: Emergency planning/management officer: [employer]  Social Needs  . Financial resource strain: Not on file  . Food insecurity:    Worry: Not on file    Inability: Not on file  . Transportation needs:    Medical: Not on file    Non-medical: Not on file  Tobacco Use  . Smoking status: Never Smoker  . Smokeless tobacco: Never Used  Substance and Sexual Activity  . Alcohol use: No    Alcohol/week: 0.0 standard drinks  . Drug use: No  . Sexual activity:  Yes    Partners: Male    Birth control/protection: None  Lifestyle  . Physical activity:    Days per week: Not on file    Minutes per session: Not on file  . Stress: Not on file  Relationships  . Social connections:    Talks on phone: Not on file    Gets together: Not on file    Attends religious service: Not on file    Active member of club or organization: Not on file    Attends meetings of clubs or organizations: Not on file    Relationship status: Not on file  . Intimate partner violence:    Fear of current or ex partner: Not on file    Emotionally abused: Not on file    Physically abused: Not on file    Forced sexual activity: Not on file  Other Topics Concern  . Not on file  Social History Narrative  . Not on file    Family History  Problem Relation Age of Onset  .  Hypertension Father   . Diabetes Paternal Grandmother   . Heart murmur Brother   . Hypertension Paternal Uncle   . Crohn's disease Maternal Grandmother   . Colon cancer Neg Hx     No Known Allergies  Medication list reviewed and updated in full in [place] Link.  GEN: No fevers, chills. Nontoxic. Primarily MSK c/o today. MSK: Detailed in the HPI GI: tolerating PO intake without difficulty Neuro: No numbness, parasthesias, or tingling associated. Otherwise the pertinent positives of the ROS are noted above.   Objective:   BP 120/60   Pulse 84   Temp 98.5 F (36.9 C) (Oral)   Ht 5\' 3"  (1.6 m)   Wt 155 lb 12 oz (70.6 kg)   BMI 27.59 kg/m    GEN: WDWN, NAD, Non-toxic, Alert & Oriented x 3 HEENT: Atraumatic, Normocephalic.  Ears and Nose: No external deformity. EXTR: No clubbing/cyanosis/edema NEURO: mild limp PSYCH: Normally interactive. Conversant. Not depressed or anxious appearing.  Calm demeanor.    No significant swelling or bruising throughout the left foot.  Fifth metatarsal is nontender.  Nontender at the joint.  Mild tenderness in the left fifth toe, but no swelling at this point.  Significantly less pain compared to last exam.  Radiology: Dg Foot Complete Left  Result Date: 02/12/2019 CLINICAL DATA:  Trauma on 02/08/2019.  Evaluate for fracture EXAM: LEFT FOOT - COMPLETE 3+ VIEW COMPARISON:  None. FINDINGS: There is an acute obliquely oriented, minimally displaced fracture involving the base of the proximal phalanx of the fifth digit with extension to the adjacent MCP joint. Expected adjacent soft tissue swelling.  No radiopaque foreign body. No additional fractures identified. Joint spaces are preserved. No significant hallux valgus deformity. No erosions. No plantar calcaneal spur. Note is made of a prominent os tibialis externum. IMPRESSION: Acute obliquely oriented minimally displaced fracture involving the base of the proximal phalanx of the fifth digit with  potential minimal intra-articular extension Electronically Signed   By: Simonne ComeJohn  Watts M.D.   On: 02/12/2019 17:32    Assessment and Plan:   Closed fracture of phalanx of left fifth toe, initial encounter  Doing much, much better.  Wean CAM Walker boot over the next 3 to 7 days.  Continue to buddy tape as needed over the next 1 to 2 weeks.  Attempt to walk normally without any sort of alteration in gait.  Follow-up: prn  Signed,  Harneet Noblett T. Zebedee Segundo, MD  Outpatient Encounter Medications as of 02/26/2019  Medication Sig  . Prenatal MV-Min-Fe Fum-FA-DHA (PRENATAL 1 PO) Take by mouth.   No facility-administered encounter medications on file as of 02/26/2019.

## 2019-02-26 ENCOUNTER — Ambulatory Visit: Payer: 59 | Admitting: Family Medicine

## 2019-02-26 ENCOUNTER — Encounter: Payer: Self-pay | Admitting: Family Medicine

## 2019-02-26 ENCOUNTER — Other Ambulatory Visit: Payer: Self-pay

## 2019-02-26 ENCOUNTER — Ambulatory Visit (INDEPENDENT_AMBULATORY_CARE_PROVIDER_SITE_OTHER): Payer: 59 | Admitting: Family Medicine

## 2019-02-26 VITALS — BP 120/60 | HR 84 | Temp 98.5°F | Ht 63.0 in | Wt 155.8 lb

## 2019-02-26 DIAGNOSIS — S92502A Displaced unspecified fracture of left lesser toe(s), initial encounter for closed fracture: Secondary | ICD-10-CM | POA: Diagnosis not present

## 2019-03-21 ENCOUNTER — Telehealth: Payer: Self-pay

## 2019-03-21 NOTE — Telephone Encounter (Signed)
Completed a fit to return to duty form and fax back to fax number provided on form.

## 2019-08-10 ENCOUNTER — Other Ambulatory Visit: Payer: Self-pay | Admitting: Physician Assistant

## 2019-08-10 DIAGNOSIS — G43009 Migraine without aura, not intractable, without status migrainosus: Secondary | ICD-10-CM

## 2020-02-22 IMAGING — US US OB COMP LESS 14 WK
1 series · 15 of 28 positions shown · non-contrast
Comparison: None

CLINICAL DATA: Viability, dating

EXAM:
OBSTETRIC <14 WK ULTRASOUND
TECHNIQUE: Transabdominal ultrasound was performed for evaluation of the
gestation as well as the maternal uterus and adnexal regions.

[Series 1: us ob comp less 14 wk · 15 of 28 slices shown]
[im 1/28]
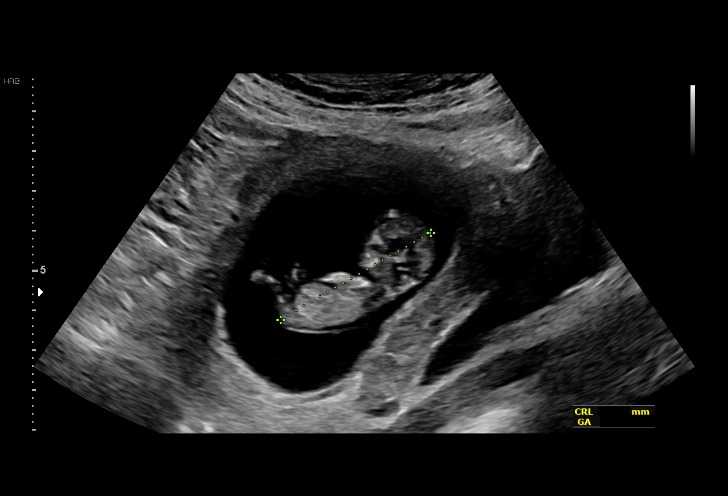
[im 3/28]
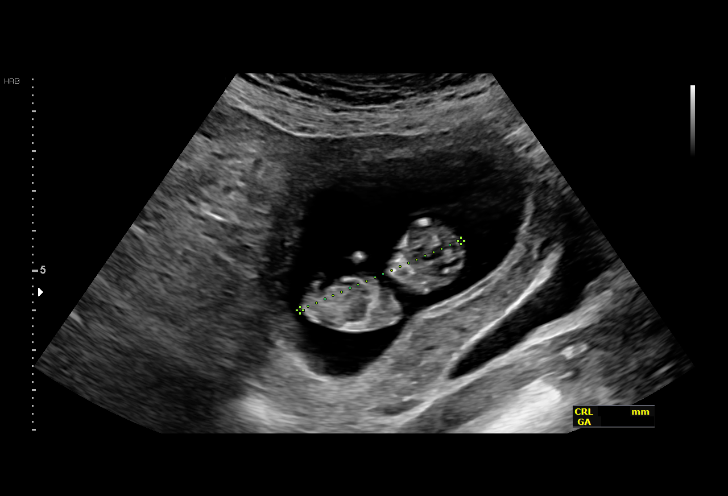
[im 5/28]
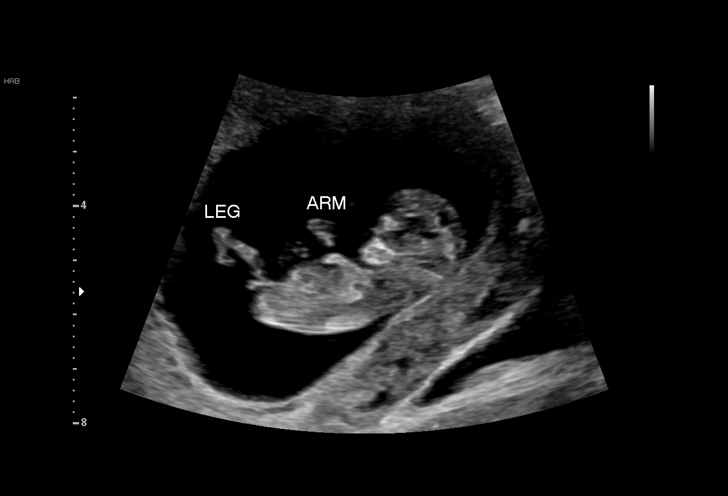
[im 7/28]
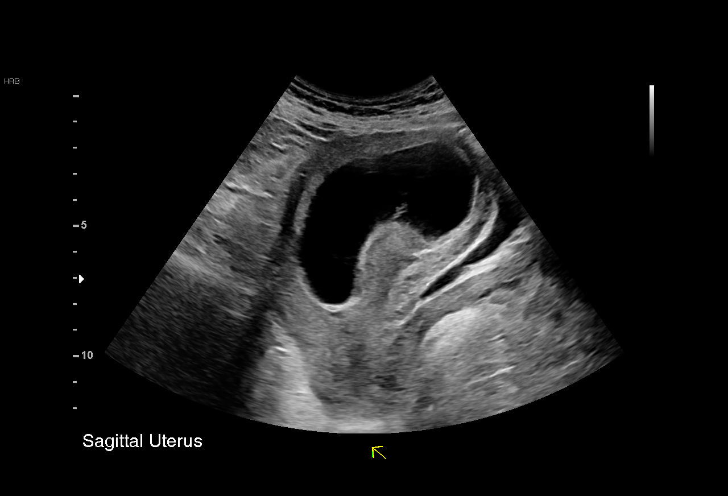
[im 9/28]
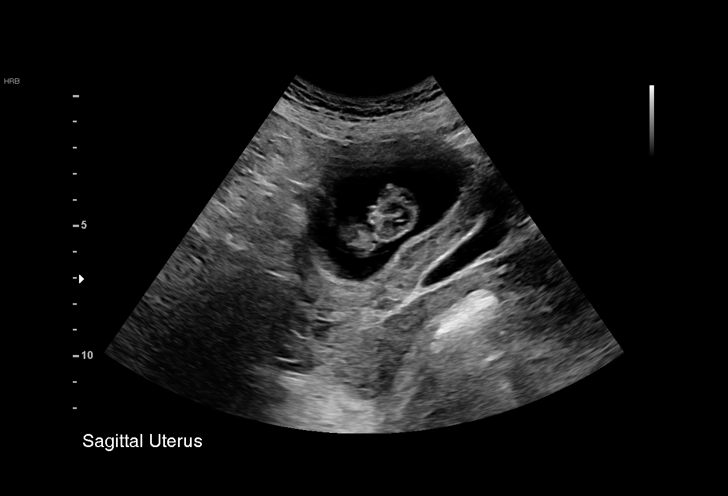
[im 11/28]
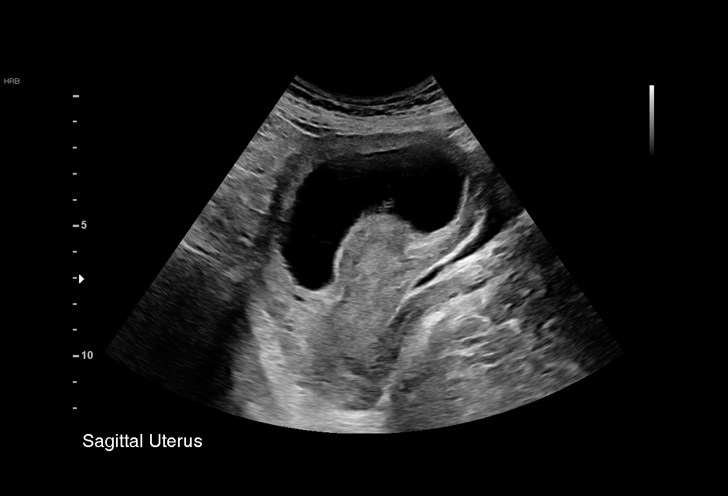
[im 13/28]
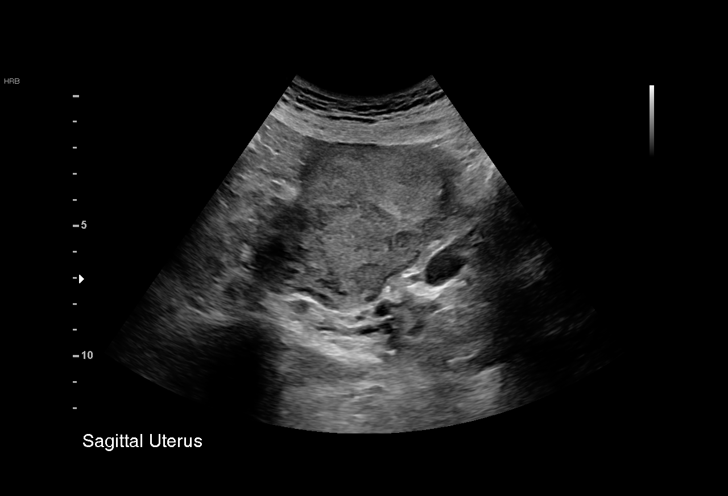
[im 15/28]
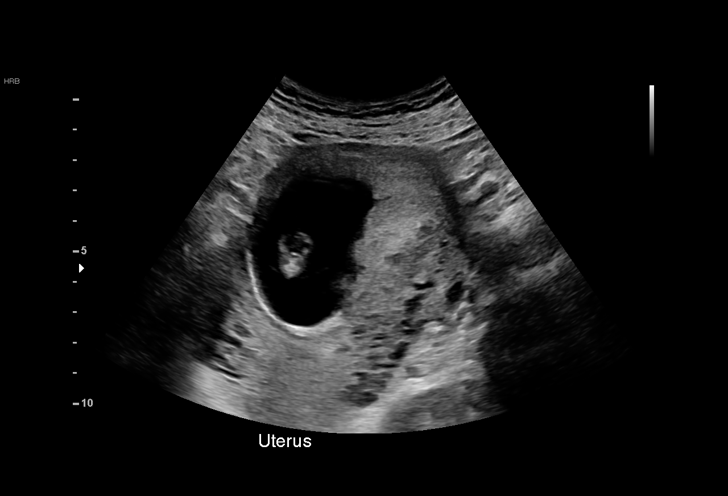
[im 16/28]
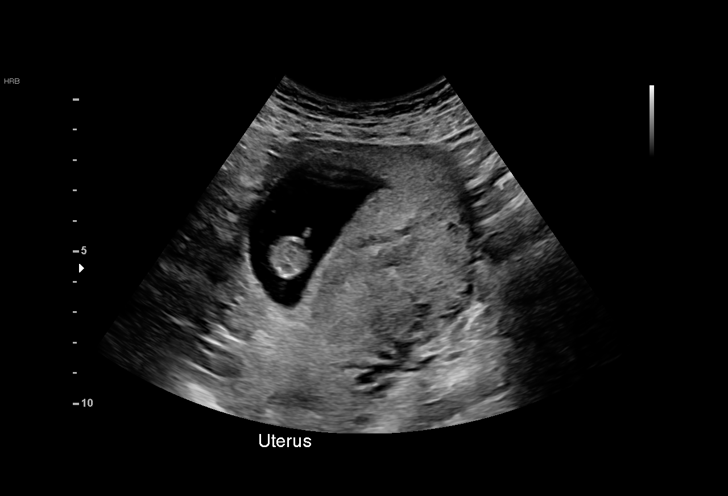
[im 18/28]
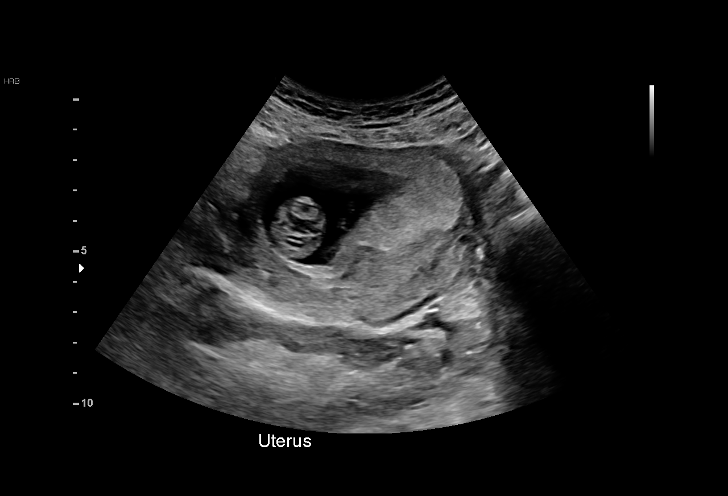
[im 20/28]
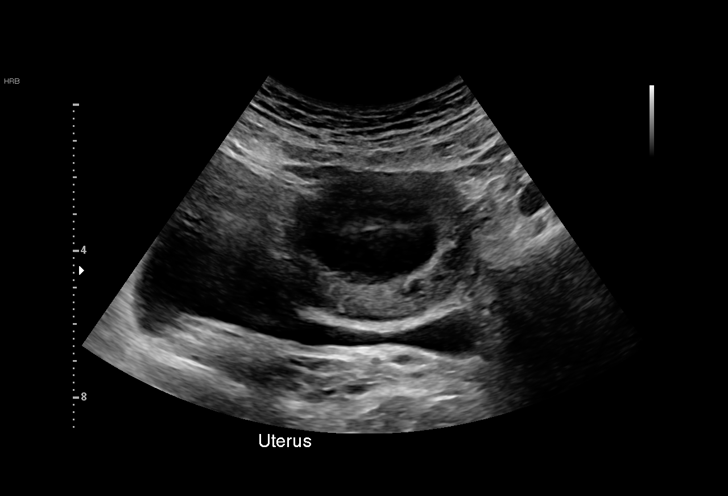
[im 22/28]
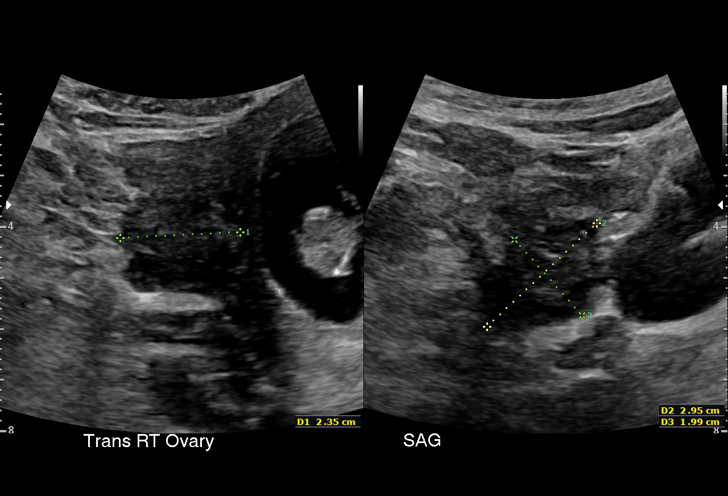
[im 24/28]
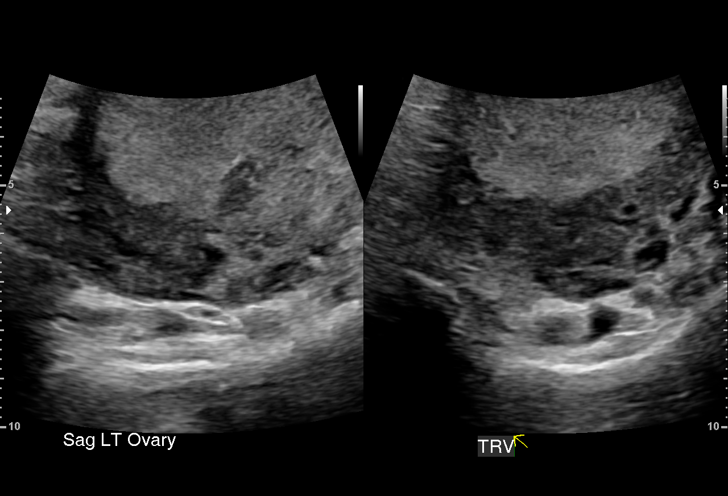
[im 26/28]
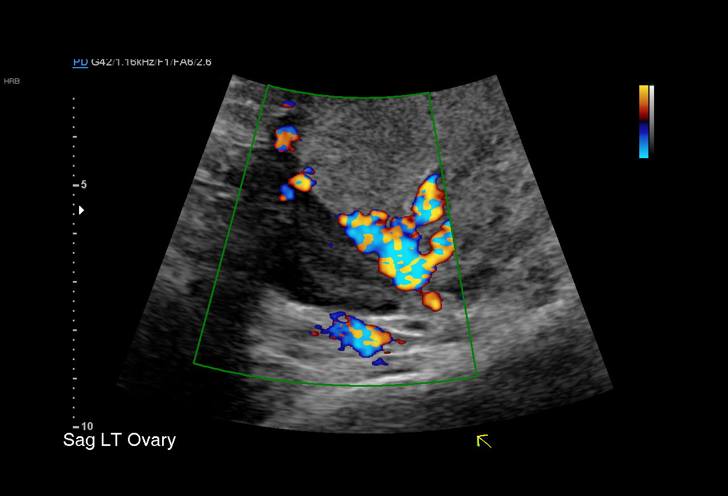
[im 28/28]
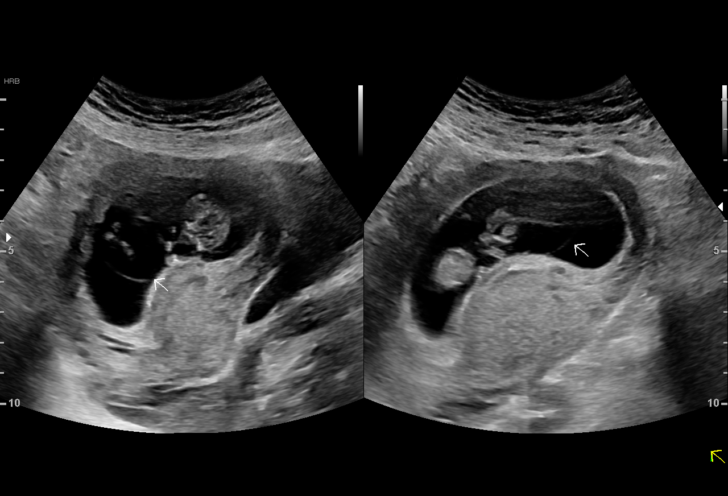

[15 of 28 positions shown; findings below may reference images not displayed]

FINDINGS: Intrauterine gestational sac: Single

Yolk sac:  Visualized

Embryo:  Visualized

Cardiac Activity: Visualized

Heart Rate: 170 bpm

MSD:   mm    w     d

CRL:   44.5 mm   11 w 1 d                  US EDC: 12/19/2018

Subchorionic hemorrhage:  None visualized.

Maternal uterus/adnexae: No adnexal mass or free fluid.
IMPRESSION: Eleven week 1 day intrauterine pregnancy. Fetal heart rate 170 beats
per minute. No acute maternal findings.

## 2022-11-01 DIAGNOSIS — Z419 Encounter for procedure for purposes other than remedying health state, unspecified: Secondary | ICD-10-CM | POA: Diagnosis not present

## 2022-11-09 ENCOUNTER — Encounter: Payer: Self-pay | Admitting: Family Medicine

## 2022-11-09 ENCOUNTER — Ambulatory Visit (INDEPENDENT_AMBULATORY_CARE_PROVIDER_SITE_OTHER): Payer: No Typology Code available for payment source | Admitting: Family Medicine

## 2022-11-09 VITALS — BP 124/72 | HR 69 | Temp 97.6°F | Ht 62.75 in | Wt 160.4 lb

## 2022-11-09 DIAGNOSIS — G43009 Migraine without aura, not intractable, without status migrainosus: Secondary | ICD-10-CM | POA: Diagnosis not present

## 2022-11-09 MED ORDER — GABAPENTIN 300 MG PO CAPS: 300.0000 mg | ORAL_CAPSULE | Freq: Three times a day (TID) | ORAL | 1 refills | Status: DC

## 2022-11-09 MED ORDER — KETOROLAC TROMETHAMINE 60 MG/2ML IM SOLN
60.0000 mg | Freq: Once | INTRAMUSCULAR | Status: AC
  Administered 2022-11-09: 60 mg via INTRAMUSCULAR

## 2022-11-09 MED ORDER — SUMATRIPTAN SUCCINATE 50 MG PO TABS: 50.0000 mg | ORAL_TABLET | Freq: Once | ORAL | 1 refills | Status: DC

## 2022-11-09 MED ORDER — PROMETHAZINE HCL 25 MG PO TABS: 25.0000 mg | ORAL_TABLET | Freq: Three times a day (TID) | ORAL | 1 refills | Status: DC | PRN

## 2022-11-09 NOTE — Patient Instructions (Addendum)
Toradol shot today for headache (this is an anti inflammatory)  Try phenergan for nausea if needed- it will sedate you   For rescue later on you can try imitrex - one pill once daily as needed   Caffeine may help a headache  Don't drink it regularly   Stay hydrated   Start back on gabapentin 300 mg at bedtime  If any problems let us know   Follow up in 1-2 months for a re check

## 2022-11-09 NOTE — Assessment & Plan Note (Addendum)
Acute on chronic  Was prev controlled with gabapentin (stopped for pregnancy) , and it worked well  Will plan to try this again for prophylaxis 300 mg qhs (warned of sedation) Disc headache prevention habits/lifestyle  For acute headache- toradol 60 mg IM now Caffeine prn Imitrex px to try (new trial) and handout given  Phenergan for nausea prn ER precautions noted Reassuring exam F/u planned for re check

## 2022-11-09 NOTE — Progress Notes (Signed)
Subjective:    Patient ID: Alyssa Gutierrez, female    DOB: [birth year], 37 y.o.   MRN: [id number]  HPI Pt presents with c/o headache   Wt Readings from Last 3 Encounters:  11/09/22 160 lb 6 oz (72.7 kg)  02/26/19 155 lb 12 oz (70.6 kg)  02/12/19 155 lb 4 oz (70.4 kg)   28.64 kg/m   Vitals:   11/09/22 1231  BP: 124/72  Pulse: 69  Temp: 97.6 F (36.4 C)  TempSrc: Temporal  SpO2: 97%  Weight: 160 lb 6 oz (72.7 kg)  Height: 5' 2.75" (1.594 m)   H/o migraines In past improved with gabapentin   Migraine for a week  Wakes her up at 4-5 am  Waxes and wanes through the day  Saturday was the worst one she has had   Had some weird soreness in upper abd - likely from nsaid Some pain in R ear- very sharp   Most of her headache is on the R - but can migrate to whole head Some pressure  Some shooting pain down her neck worse if she stands  up straight   Had some aura - zig zags in front of eyes  Feels like ears are blocked on/off  Throbbing  Nausea  Dizzy at times    No ST  No nasal symptoms    Had eye doctor visit 2 wk ago  Had retinal photograph- was sent to [facility] for abnormal finding   BP Readings from Last 3 Encounters:  11/09/22 124/72  02/26/19 120/60  02/12/19 118/90   Pulse Readings from Last 3 Encounters:  11/09/22 69  02/26/19 84  02/12/19 89   Right now dull ache base of skull on R and top of head     Thinks she is done with pregnancies  Using condoms     Has regular periods  Lmp was 12/22   Lives in [place] [facility] house  Just spent a yr in [place] - less headaches there vs here     Otc Nsaids  Excedrin migraine  Some caffeine   Tried a bendryl with advil , tylenol    Patient Active Problem List   Diagnosis Date Noted   Postpartum hypertension 12/13/2018   NSVD (normal spontaneous vaginal delivery) 12/13/2018   Muscle spasm 07/28/2018   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   IBS (irritable bowel syndrome)  09/24/2014   Migraine headache 03/19/2013   Past Medical History:  Diagnosis Date   Family history of heart murmur    at birth   History of allergy    History of chicken pox    age 65   History of headache    History of migraine    IBS (irritable bowel syndrome)    Nausea 03/14/2015   Around day of ovulation every month     Normal labor 12/12/2018   Supervision of other normal pregnancy, antepartum 05/23/2018    Nursing Staff Provider Office Location  [facility] Dating  LMP = 11 wl [place] Language  English Anatomy US  WNL--low lying placenta--f/u 4 wks Flu Vaccine  Declined -06/20/18 Genetic Screen  declined  TDaP vaccine   10/18/18 Hgb A1C or  GTT Early  Third trimester  Rhogam  n/a   LAB RESULTS  Feeding Plan  Breast Blood Type O/Positive/-- (07/24 1614)  Contraception Considering BTL vs IUD Antibody Negative (   Past Surgical History:  Procedure Laterality Date   ROOT CANAL     age 11   WISDOM  TOOTH EXTRACTION     age 86   Social History   Tobacco Use   Smoking status: Never   Smokeless tobacco: Never  Substance Use Topics   Alcohol use: No    Alcohol/week: 0.0 standard drinks of alcohol   Drug use: No   Family History  Problem Relation Age of Onset   Hypertension Father    Diabetes Paternal Grandmother    Heart murmur Brother    Hypertension Paternal Uncle    Crohn's disease Maternal Grandmother    Colon cancer Neg Hx    Allergies  Allergen Reactions   Shellfish Allergy Diarrhea, Nausea And Vomiting and Palpitations   No current outpatient medications on file prior to visit.   No current facility-administered medications on file prior to visit.    Review of Systems  Constitutional:  Negative for activity change, appetite change, fatigue, fever and unexpected weight change.  HENT:  Negative for congestion, ear pain, rhinorrhea, sinus pressure and sore throat.   Eyes:  Negative for pain, redness and visual disturbance.  Respiratory:  Negative for cough, shortness of  breath and wheezing.   Cardiovascular:  Negative for chest pain and palpitations.  Gastrointestinal:  Positive for nausea. Negative for abdominal pain, blood in stool, constipation, diarrhea, rectal pain and vomiting.  Endocrine: Negative for polydipsia and polyuria.  Genitourinary:  Negative for dysuria, frequency and urgency.  Musculoskeletal:  Negative for arthralgias, back pain and myalgias.  Skin:  Negative for pallor and rash.  Allergic/Immunologic: Negative for environmental allergies.  Neurological:  Positive for headaches. Negative for dizziness and syncope.  Hematological:  Negative for adenopathy. Does not bruise/bleed easily.  Psychiatric/Behavioral:  Negative for decreased concentration and dysphoric mood. The patient is not nervous/anxious.        Objective:   Physical Exam Constitutional:      General: She is not in acute distress.    Appearance: Normal appearance. She is well-developed and normal weight. She is not ill-appearing or diaphoretic.  HENT:     Head: Normocephalic and atraumatic.     Comments: No sinus tenderness    Right Ear: Tympanic membrane, ear canal and external ear normal.     Left Ear: Tympanic membrane, ear canal and external ear normal.     Nose: Nose normal.     Mouth/Throat:     Mouth: Mucous membranes are moist.     Pharynx: Oropharynx is clear. No oropharyngeal exudate.  Eyes:     General: No scleral icterus.       Right eye: No discharge.        Left eye: No discharge.     Extraocular Movements: Extraocular movements intact.     Conjunctiva/sclera: Conjunctivae normal.     Pupils: Pupils are equal, round, and reactive to light.     Comments: No nystagmus  Neck:     Thyroid: No thyromegaly.     Vascular: No carotid bruit or JVD.     Trachea: No tracheal deviation.  Cardiovascular:     Rate and Rhythm: Normal rate and regular rhythm.     Heart sounds: Normal heart sounds. No murmur heard. Pulmonary:     Effort: Pulmonary effort is  normal. No respiratory distress.     Breath sounds: Normal breath sounds. No wheezing or rales.  Abdominal:     General: Bowel sounds are normal. There is no distension.     Palpations: Abdomen is soft. There is no mass.     Tenderness: There is no abdominal  tenderness.  Musculoskeletal:        General: No tenderness.     Cervical back: Full passive range of motion without pain, normal range of motion and neck supple.  Lymphadenopathy:     Cervical: No cervical adenopathy.  Skin:    General: Skin is warm and dry.     Capillary Refill: Capillary refill takes 2 to 3 seconds.     Coloration: Skin is not pale.     Findings: No rash.  Neurological:     General: No focal deficit present.     Mental Status: She is alert and oriented to person, place, and time.     Cranial Nerves: No cranial nerve deficit, dysarthria or facial asymmetry.     Sensory: No sensory deficit.     Motor: No weakness, tremor, atrophy, abnormal muscle tone or pronator drift.     Coordination: Romberg sign positive. Coordination normal. Finger-Nose-Finger Test normal.     Gait: Gait is intact. Gait normal.     Deep Tendon Reflexes: Reflexes are normal and symmetric. Reflexes normal.     Comments: No focal cerebellar signs   Psychiatric:        Behavior: Behavior normal.        Thought Content: Thought content normal.           Assessment & Plan:   Problem List Items Addressed This Visit       Cardiovascular and Mediastinum   Migraine headache - Primary    Acute on chronic  Was prev controlled with gabapentin (stopped for pregnancy) , and it worked well  Will plan to try this again for prophylaxis 300 mg qhs (warned of sedation) Disc headache prevention habits/lifestyle  For acute headache- toradol 60 mg IM now Caffeine prn Imitrex px to try (new trial) and handout given  Phenergan for nausea prn ER precautions noted Reassuring exam F/u planned for re check       Relevant Medications    gabapentin (NEURONTIN) 300 MG capsule   SUMAtriptan (IMITREX) 50 MG tablet

## 2022-12-02 DIAGNOSIS — Z419 Encounter for procedure for purposes other than remedying health state, unspecified: Secondary | ICD-10-CM | POA: Diagnosis not present

## 2022-12-31 DIAGNOSIS — Z419 Encounter for procedure for purposes other than remedying health state, unspecified: Secondary | ICD-10-CM | POA: Diagnosis not present

## 2023-01-07 ENCOUNTER — Other Ambulatory Visit: Payer: Self-pay | Admitting: Family Medicine

## 2023-01-07 NOTE — Telephone Encounter (Signed)
Last filled on 11/09/22 #10 tabs with 1 refill, f/u scheduled 01/10/23

## 2023-01-10 ENCOUNTER — Ambulatory Visit (INDEPENDENT_AMBULATORY_CARE_PROVIDER_SITE_OTHER): Payer: No Typology Code available for payment source | Admitting: Family Medicine

## 2023-01-10 ENCOUNTER — Encounter: Payer: Self-pay | Admitting: Family Medicine

## 2023-01-10 VITALS — BP 128/68 | HR 67 | Temp 97.9°F | Ht 62.75 in | Wt 162.4 lb

## 2023-01-10 DIAGNOSIS — G43009 Migraine without aura, not intractable, without status migrainosus: Secondary | ICD-10-CM | POA: Diagnosis not present

## 2023-01-10 MED ORDER — GABAPENTIN 300 MG PO CAPS: 300.0000 mg | ORAL_CAPSULE | Freq: Every day | ORAL | 3 refills | Status: DC

## 2023-01-10 MED ORDER — RIZATRIPTAN BENZOATE 10 MG PO TABS: ORAL_TABLET | ORAL | 11 refills | Status: DC

## 2023-01-10 NOTE — Progress Notes (Signed)
Subjective:    Patient ID: Alyssa Gutierrez, female    DOB: 16-Nov-1985, 37 y.o.   MRN: [id number]  HPI Pr presents for f/u of headaches  Wt Readings from Last 3 Encounters:  01/10/23 162 lb 6 oz (73.7 kg)  11/09/22 160 lb 6 oz (72.7 kg)  02/26/19 155 lb 12 oz (70.6 kg)   28.99 kg/m  Vitals:   01/10/23 1425  BP: 128/68  Pulse: 67  Temp: 97.9 F (36.6 C)  SpO2: 97%   Last visit disc plan for chronic migraine headache with occ aura  We re started gabapentin 300 mg at bedtime -to inc to tid if needed Imitrex for rescue   Sleeping ok   Much better overall  Just a few  One was last week with big weather change - took the sumatriptan  Made her nose and throat burned   Took tylenol for a mild headache    Is open to trying a different tryptan  Sticking to bed time - trying not to sleep in  Drinking more fluids   Patient Active Problem List   Diagnosis Date Noted   Postpartum hypertension 12/13/2018   NSVD (normal spontaneous vaginal delivery) 12/13/2018   Muscle spasm 07/28/2018   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   IBS (irritable bowel syndrome) 09/24/2014   Migraine headache 03/19/2013   Past Medical History:  Diagnosis Date   Family history of heart murmur    at birth   History of allergy    History of chicken pox    age 84   History of headache    History of migraine    IBS (irritable bowel syndrome)    Nausea 03/14/2015   Around day of ovulation every month     Normal labor 12/12/2018   Supervision of other normal pregnancy, antepartum 05/23/2018    Nursing Staff Provider Office Location  [facility] Dating  LMP = 11 wl [place] Language  English Anatomy US  WNL--low lying placenta--f/u 4 wks Flu Vaccine  Declined -06/20/18 Genetic Screen  declined  TDaP vaccine   10/18/18 Hgb A1C or  GTT Early  Third trimester  Rhogam  n/a   LAB RESULTS  Feeding Plan  Breast Blood Type O/Positive/-- (07/24 1614)  Contraception Considering BTL vs IUD Antibody Negative (   Past  Surgical History:  Procedure Laterality Date   ROOT CANAL     age 22   WISDOM TOOTH EXTRACTION     age 16   Social History   Tobacco Use   Smoking status: Never   Smokeless tobacco: Never  Substance Use Topics   Alcohol use: No    Alcohol/week: 0.0 standard drinks of alcohol   Drug use: No   Family History  Problem Relation Age of Onset   Hypertension Father    Diabetes Paternal Grandmother    Heart murmur Brother    Hypertension Paternal Uncle    Crohn's disease Maternal Grandmother    Colon cancer Neg Hx    Allergies  Allergen Reactions   Shellfish Allergy Diarrhea, Nausea And Vomiting and Palpitations   Current Outpatient Medications on File Prior to Visit  Medication Sig Dispense Refill   promethazine (PHENERGAN) 25 MG tablet Take 1 tablet (25 mg total) by mouth every 8 (eight) hours as needed for nausea or vomiting (with migraine). 20 tablet 1   No current facility-administered medications on file prior to visit.    Review of Systems  Constitutional:  Negative for activity change, appetite change, fatigue, fever  and unexpected weight change.  HENT:  Negative for congestion, ear pain, rhinorrhea, sinus pressure and sore throat.   Eyes:  Negative for pain, redness and visual disturbance.  Respiratory:  Negative for cough, shortness of breath and wheezing.   Cardiovascular:  Negative for chest pain and palpitations.  Gastrointestinal:  Negative for abdominal pain, blood in stool, constipation and diarrhea.  Endocrine: Negative for polydipsia and polyuria.  Genitourinary:  Negative for dysuria, frequency and urgency.  Musculoskeletal:  Negative for arthralgias, back pain and myalgias.  Skin:  Negative for pallor and rash.  Allergic/Immunologic: Negative for environmental allergies.  Neurological:  Positive for headaches. Negative for dizziness, tremors, seizures, syncope, facial asymmetry, speech difficulty, weakness, light-headedness and numbness.  Hematological:   Negative for adenopathy. Does not bruise/bleed easily.  Psychiatric/Behavioral:  Negative for decreased concentration and dysphoric mood. The patient is not nervous/anxious.        Objective:   Physical Exam Constitutional:      General: She is not in acute distress.    Appearance: Normal appearance. She is well-developed and normal weight. She is not ill-appearing or diaphoretic.  HENT:     Head: Normocephalic and atraumatic.  Eyes:     Conjunctiva/sclera: Conjunctivae normal.     Pupils: Pupils are equal, round, and reactive to light.  Neck:     Thyroid: No thyromegaly.     Vascular: No carotid bruit or JVD.  Cardiovascular:     Rate and Rhythm: Normal rate and regular rhythm.     Heart sounds: Normal heart sounds.     No gallop.  Pulmonary:     Effort: Pulmonary effort is normal. No respiratory distress.     Breath sounds: Normal breath sounds. No wheezing or rales.  Abdominal:     General: There is no distension or abdominal bruit.     Palpations: Abdomen is soft.  Musculoskeletal:     Cervical back: Normal range of motion and neck supple.     Right lower leg: No edema.     Left lower leg: No edema.  Lymphadenopathy:     Cervical: No cervical adenopathy.  Skin:    General: Skin is warm and dry.     Coloration: Skin is not pale.     Findings: No rash.  Neurological:     Mental Status: She is alert.     Cranial Nerves: No cranial nerve deficit.     Sensory: No sensory deficit.     Motor: No weakness.     Coordination: Coordination normal.     Gait: Gait normal.     Deep Tendon Reflexes: Reflexes are normal and symmetric. Reflexes normal.  Psychiatric:        Mood and Affect: Mood normal.     Comments: Good mood           Assessment & Plan:   Problem List Items Addressed This Visit       Cardiovascular and Mediastinum   Migraine headache - Primary    Chronic migraines are much improved with gabapentin 300 mg qhs  Tolerates well  Did not like side eff  of imitrex/ sent in maxalt 10 to try instead  Cautioned on analgesic overuse , does not tend to use often Good hydration/sleep habits  Inst to continue current medicines and update if not further imp or worse Headache journal if needed       Relevant Medications   rizatriptan (MAXALT) 10 MG tablet   gabapentin (NEURONTIN) 300 MG capsule

## 2023-01-10 NOTE — Patient Instructions (Signed)
Conitnue the gabapentin  Try maxalt instead of the imitrex, see if you do better   Don't overuse tylenol  Continue fluids  Continue sleep schedule    If problems /issues let us know

## 2023-01-10 NOTE — Assessment & Plan Note (Signed)
Chronic migraines are much improved with gabapentin 300 mg qhs  Tolerates well  Did not like side eff of imitrex/ sent in maxalt 10 to try instead  Cautioned on analgesic overuse , does not tend to use often Good hydration/sleep habits  Inst to continue current medicines and update if not further imp or worse Headache journal if needed

## 2023-01-31 DIAGNOSIS — Z419 Encounter for procedure for purposes other than remedying health state, unspecified: Secondary | ICD-10-CM | POA: Diagnosis not present

## 2023-03-02 DIAGNOSIS — Z419 Encounter for procedure for purposes other than remedying health state, unspecified: Secondary | ICD-10-CM | POA: Diagnosis not present

## 2023-04-02 DIAGNOSIS — Z419 Encounter for procedure for purposes other than remedying health state, unspecified: Secondary | ICD-10-CM | POA: Diagnosis not present

## 2023-05-02 DIAGNOSIS — Z419 Encounter for procedure for purposes other than remedying health state, unspecified: Secondary | ICD-10-CM | POA: Diagnosis not present

## 2023-06-02 DIAGNOSIS — Z419 Encounter for procedure for purposes other than remedying health state, unspecified: Secondary | ICD-10-CM | POA: Diagnosis not present

## 2023-07-03 DIAGNOSIS — Z419 Encounter for procedure for purposes other than remedying health state, unspecified: Secondary | ICD-10-CM | POA: Diagnosis not present

## 2023-08-02 DIAGNOSIS — Z419 Encounter for procedure for purposes other than remedying health state, unspecified: Secondary | ICD-10-CM | POA: Diagnosis not present

## 2023-09-02 DIAGNOSIS — Z419 Encounter for procedure for purposes other than remedying health state, unspecified: Secondary | ICD-10-CM | POA: Diagnosis not present

## 2023-10-02 DIAGNOSIS — Z419 Encounter for procedure for purposes other than remedying health state, unspecified: Secondary | ICD-10-CM | POA: Diagnosis not present

## 2023-11-02 DIAGNOSIS — Z419 Encounter for procedure for purposes other than remedying health state, unspecified: Secondary | ICD-10-CM | POA: Diagnosis not present

## 2023-12-03 DIAGNOSIS — Z419 Encounter for procedure for purposes other than remedying health state, unspecified: Secondary | ICD-10-CM | POA: Diagnosis not present

## 2023-12-31 DIAGNOSIS — Z419 Encounter for procedure for purposes other than remedying health state, unspecified: Secondary | ICD-10-CM | POA: Diagnosis not present

## 2024-01-18 ENCOUNTER — Encounter: Payer: Self-pay | Admitting: Family Medicine

## 2024-01-18 ENCOUNTER — Ambulatory Visit (INDEPENDENT_AMBULATORY_CARE_PROVIDER_SITE_OTHER): Admitting: Family Medicine

## 2024-01-18 VITALS — BP 112/68 | HR 78 | Temp 98.3°F | Ht 62.75 in | Wt 165.0 lb

## 2024-01-18 DIAGNOSIS — G43009 Migraine without aura, not intractable, without status migrainosus: Secondary | ICD-10-CM

## 2024-01-18 MED ORDER — GABAPENTIN 100 MG PO CAPS: ORAL_CAPSULE | ORAL | 3 refills | Status: AC

## 2024-01-18 MED ORDER — PROMETHAZINE HCL 25 MG PO TABS: 25.0000 mg | ORAL_TABLET | Freq: Three times a day (TID) | ORAL | 1 refills | Status: AC | PRN

## 2024-01-18 MED ORDER — RIZATRIPTAN BENZOATE 10 MG PO TABS: ORAL_TABLET | ORAL | 11 refills | Status: AC

## 2024-01-18 NOTE — Patient Instructions (Addendum)
 Continue gabapentin 300 mg at bedtime  Start 100 mg gabapentin in am / 100 mid day   Check in with [place] in 1-2 weeks - if helping will gradually go up on the day time doses   Maxalt as needed  Stay very well hydrated  Avoid excessive caffeine   Phenergan is ok for headache with nausea -it may make you sleepy     I'm interested to know if nurtec is covered by your insurance  It can be used for acute headache and also prevention   I put the referral in for neurology  Please let us know if you don't hear in 1-2 weeks

## 2024-01-18 NOTE — Assessment & Plan Note (Signed)
 After initial improvement, worse again since January  Almost daily Unsure of triggers-stress may be one  Also gets some neck soreness on right  Headaches do cause nausea and sometimes dizziness and slowed speech  Maxalt helps but gives her side effects (encouraged to check on coverage of nurtec with her insurance)  Reassuring exam today    Will increase gabapentin to 100 am , 100 mid day and continue 300 at bedtime (reviewed possible side effects)  Will update in 1-2 wk/ if helpful can discuss advancing dose  Discussed lifestyle change for headaches Maxalt for rescue  Phenergan for nausea (caution of sedation)   May be candidate for trigger point inj or botox  Referral made to neurology (pt pref [place] if possible)

## 2024-01-18 NOTE — Progress Notes (Signed)
 Subjective:    Patient ID: Alyssa Gutierrez, female    DOB: 1986-04-27, 38 y.o.   MRN: [id number]  HPI  Wt Readings from Last 3 Encounters:  01/18/24 165 lb (74.8 kg)  01/10/23 162 lb 6 oz (73.7 kg)  11/09/22 160 lb 6 oz (72.7 kg)   29.46 kg/m  Vitals:   01/18/24 1600  BP: 112/68  Pulse: 78  Temp: 98.3 F (36.8 C)  SpO2: 99%    Pt presents with c/o migraines  Worse since early in the year    Takes gabapentin 300 mg at bedtime Initially was helping   Last visit sent in maxalt for rescue  Also discussed avoiding analgesic overuse    Migraines stopped for a while Came back   Went to bed with one last week/ woke up later -severe /close to going to the ER  That was worst one every    Has headache most days now  On average last 1-2 hours with the maxalt (makes her feel funny makes her nose and throat hurt and dry mouth)  Usually one sided - goes back and forth  Throbbing  Some nausea /no vomiting  Light sensitive  Will take tylenol or nsaid first  Caffeine does not help /not any more     Last menses was different / last month  Very heavy and big clots  Not trying to get pregnant  Not using any birth control   No timing change with periods  Stress may be a trigger (work is worse lately)   A light sleeper-wakes up easily  6 hours per night  Really needs 7 if she could get it   Not eating more gluten   Gets some allergies in the spring   Used a symptom app  Weather change makes a difference  Occational may need more fluids   Caffeine - one coffee in am / may have one soda or tea   Occational aura but not always          Patient Active Problem List   Diagnosis Date Noted   Postpartum hypertension 12/13/2018   NSVD (normal spontaneous vaginal delivery) 12/13/2018   Muscle spasm 07/28/2018   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   IBS (irritable bowel syndrome) 09/24/2014   Migraine headache 03/19/2013   Past Medical History:   Diagnosis Date   Family history of heart murmur    at birth   History of allergy    History of chicken pox    age 17   History of headache    History of migraine    IBS (irritable bowel syndrome)    Nausea 03/14/2015   Around day of ovulation every month     Normal labor 12/12/2018   Supervision of other normal pregnancy, antepartum 05/23/2018    Nursing Staff Provider Office Location  [facility] Dating  LMP = 11 wl [place] Language  English Anatomy US  WNL--low lying placenta--f/u 4 wks Flu Vaccine  Declined -06/20/18 Genetic Screen  declined  TDaP vaccine   10/18/18 Hgb A1C or  GTT Early  Third trimester  Rhogam  n/a   LAB RESULTS  Feeding Plan  Breast Blood Type O/Positive/-- (07/24 1614)  Contraception Considering BTL vs IUD Antibody Negative (   Past Surgical History:  Procedure Laterality Date   ROOT CANAL     age 2   WISDOM TOOTH EXTRACTION     age 23   Social History   Tobacco Use   Smoking status: Never  Smokeless tobacco: Never  Substance Use Topics   Alcohol use: No    Alcohol/week: 0.0 standard drinks of alcohol   Drug use: No   Family History  Problem Relation Age of Onset   Hypertension Father    Diabetes Paternal Grandmother    Heart murmur Brother    Hypertension Paternal Uncle    Crohn's disease Maternal Grandmother    Colon cancer Neg Hx    Allergies  Allergen Reactions   Shellfish Allergy Diarrhea, Nausea And Vomiting and Palpitations   Current Outpatient Medications on File Prior to Visit  Medication Sig Dispense Refill   gabapentin (NEURONTIN) 300 MG capsule Take 1 capsule (300 mg total) by mouth at bedtime. 90 capsule 3   No current facility-administered medications on file prior to visit.    Review of Systems  Constitutional:  Negative for activity change, appetite change, fatigue, fever and unexpected weight change.  HENT:  Negative for congestion, ear pain, rhinorrhea, sinus pressure and sore throat.   Eyes:  Negative for pain, redness and visual  disturbance.  Respiratory:  Negative for cough, shortness of breath and wheezing.   Cardiovascular:  Negative for chest pain and palpitations.  Gastrointestinal:  Positive for nausea. Negative for abdominal pain, blood in stool, constipation, diarrhea and vomiting.  Endocrine: Negative for polydipsia and polyuria.  Genitourinary:  Negative for dysuria, frequency and urgency.  Musculoskeletal:  Negative for arthralgias, back pain and myalgias.  Skin:  Negative for pallor and rash.  Allergic/Immunologic: Negative for environmental allergies.  Neurological:  Positive for light-headedness and headaches. Negative for dizziness, tremors, seizures, syncope, facial asymmetry, weakness and numbness.       Some slowed speech when having migraine   Hematological:  Negative for adenopathy. Does not bruise/bleed easily.  Psychiatric/Behavioral:  Negative for decreased concentration and dysphoric mood. The patient is not nervous/anxious.        Objective:   Physical Exam Constitutional:      General: She is not in acute distress.    Appearance: Normal appearance. She is well-developed. She is not ill-appearing or diaphoretic.     Comments: Overweight   HENT:     Head: Normocephalic and atraumatic.     Comments: No facial or temporal tenderness    Right Ear: External ear normal.     Left Ear: External ear normal.     Nose: Nose normal.     Mouth/Throat:     Pharynx: No oropharyngeal exudate.  Eyes:     General: No scleral icterus.       Right eye: No discharge.        Left eye: No discharge.     Conjunctiva/sclera: Conjunctivae normal.     Pupils: Pupils are equal, round, and reactive to light.     Comments: No nystagmus  Neck:     Thyroid: No thyromegaly.     Vascular: No carotid bruit or JVD.     Trachea: No tracheal deviation.     Comments: Some tenderness at right occiput area  No masses  No adenopathy  Normal rom neck  Cardiovascular:     Rate and Rhythm: Normal rate and regular  rhythm.     Heart sounds: Normal heart sounds. No murmur heard. Pulmonary:     Effort: Pulmonary effort is normal. No respiratory distress.     Breath sounds: Normal breath sounds. No wheezing or rales.  Abdominal:     General: Bowel sounds are normal. There is no distension.     Palpations:  Abdomen is soft. There is no mass.     Tenderness: There is no abdominal tenderness.  Musculoskeletal:        General: No tenderness.     Cervical back: Full passive range of motion without pain, normal range of motion and neck supple.  Lymphadenopathy:     Cervical: No cervical adenopathy.  Skin:    General: Skin is warm and dry.     Coloration: Skin is not pale.     Findings: No rash.  Neurological:     Mental Status: She is alert and oriented to person, place, and time.     Cranial Nerves: No cranial nerve deficit.     Sensory: No sensory deficit.     Motor: No weakness, tremor, atrophy or abnormal muscle tone.     Coordination: Coordination normal.     Gait: Gait normal.     Deep Tendon Reflexes: Reflexes are normal and symmetric. Reflexes normal.     Comments: No focal cerebellar signs   Psychiatric:        Mood and Affect: Mood normal.        Behavior: Behavior normal.        Thought Content: Thought content normal.           Assessment & Plan:   Problem List Items Addressed This Visit       Cardiovascular and Mediastinum   Migraine headache - Primary   After initial improvement, worse again since January  Almost daily Unsure of triggers-stress may be one  Also gets some neck soreness on right  Headaches do cause nausea and sometimes dizziness and slowed speech  Maxalt helps but gives her side effects (encouraged to check on coverage of nurtec with her insurance)  Reassuring exam today    Will increase gabapentin to 100 am , 100 mid day and continue 300 at bedtime (reviewed possible side effects)  Will update in 1-2 wk/ if helpful can discuss advancing dose  Discussed  lifestyle change for headaches Maxalt for rescue  Phenergan for nausea (caution of sedation)   May be candidate for trigger point inj or botox  Referral made to neurology (pt pref [place] if possible)      Relevant Medications   gabapentin (NEURONTIN) 100 MG capsule   rizatriptan (MAXALT) 10 MG tablet   Other Relevant Orders   Ambulatory referral to Neurology

## 2024-01-19 ENCOUNTER — Other Ambulatory Visit: Payer: Self-pay | Admitting: Family Medicine

## 2024-01-19 NOTE — Telephone Encounter (Signed)
 Copied from CRM [id number]. Topic: Clinical - Medication Refill >> Jan 19, 2024 11:22 AM Deaijah H wrote: Most Recent Primary Care Visit:  Provider: Roxy Manns A  Department: [facility]  Visit Type: OFFICE VISIT  Date: 01/10/2023  Medication: gabapentin (NEURONTIN) 300 MG capsule  Has the patient contacted their pharmacy? No (Agent: If no, request that the patient contact the pharmacy for the refill. If patient does not wish to contact the pharmacy document the reason why and proceed with request.) (Agent: If yes, when and what did the pharmacy advise?) On app states she's not allowed to do it   Is this the correct pharmacy for this prescription? Yes If no, delete pharmacy and type the correct one.  This is the patient's preferred pharmacy:  [facility] [phone] [facility], [place] - [address] [place] [address] Phone: [phone] Fax: [phone]   Has the prescription been filled recently? No  Is the patient out of the medication? Yes  Has the patient been seen for an appointment in the last year OR does the patient have an upcoming appointment? Yes  Can we respond through MyChart? Yes  Agent: Please be advised that Rx refills may take up to 3 business days. We ask that you follow-up with your pharmacy.

## 2024-01-20 MED ORDER — GABAPENTIN 300 MG PO CAPS: 300.0000 mg | ORAL_CAPSULE | Freq: Every day | ORAL | 3 refills | Status: AC

## 2024-01-20 NOTE — Telephone Encounter (Signed)
 Last filled on 01/10/23 #90 caps/ 3 refills   Last OV was to discuss migraines on 01/18/24 please advise

## 2024-02-01 ENCOUNTER — Telehealth: Payer: Self-pay

## 2024-02-01 NOTE — Telephone Encounter (Signed)
 Copied from CRM [id number]. Topic: General - Other >> Feb 01, 2024  3:44 PM Rodman Pickle T wrote: Reason for CRM: patient has a referral that she is calling in about she would like a call back

## 2024-02-02 NOTE — Telephone Encounter (Signed)
 Neurology referral sent to [facility].  MyChart message sent to patient.

## 2024-02-11 DIAGNOSIS — Z419 Encounter for procedure for purposes other than remedying health state, unspecified: Secondary | ICD-10-CM | POA: Diagnosis not present

## 2024-02-24 ENCOUNTER — Other Ambulatory Visit: Payer: Self-pay | Admitting: Student

## 2024-02-24 DIAGNOSIS — G43709 Chronic migraine without aura, not intractable, without status migrainosus: Secondary | ICD-10-CM

## 2024-02-27 ENCOUNTER — Encounter: Payer: Self-pay | Admitting: Student

## 2024-03-04 ENCOUNTER — Ambulatory Visit
Admission: RE | Admit: 2024-03-04 | Discharge: 2024-03-04 | Disposition: A | Discharge status: Home / Self Care | Source: Ambulatory Visit | Attending: Student | Admitting: Student

## 2024-03-04 DIAGNOSIS — G43709 Chronic migraine without aura, not intractable, without status migrainosus: Secondary | ICD-10-CM

## 2024-03-06 ENCOUNTER — Other Ambulatory Visit

## 2024-03-12 DIAGNOSIS — Z419 Encounter for procedure for purposes other than remedying health state, unspecified: Secondary | ICD-10-CM | POA: Diagnosis not present

## 2024-04-12 DIAGNOSIS — Z419 Encounter for procedure for purposes other than remedying health state, unspecified: Secondary | ICD-10-CM | POA: Diagnosis not present

## 2024-05-12 DIAGNOSIS — Z419 Encounter for procedure for purposes other than remedying health state, unspecified: Secondary | ICD-10-CM | POA: Diagnosis not present

## 2024-06-12 DIAGNOSIS — Z419 Encounter for procedure for purposes other than remedying health state, unspecified: Secondary | ICD-10-CM | POA: Diagnosis not present

## 2024-07-13 DIAGNOSIS — Z419 Encounter for procedure for purposes other than remedying health state, unspecified: Secondary | ICD-10-CM | POA: Diagnosis not present

## 2024-10-31 ENCOUNTER — Ambulatory Visit: Payer: Self-pay

## 2024-10-31 NOTE — Telephone Encounter (Signed)
 Agree with that disposition-the office is closed now  Aware, will watch for correspondence

## 2024-10-31 NOTE — Telephone Encounter (Signed)
 FYI Only or Action Required?: FYI only for provider: ED advised and UC.  Patient was last seen in primary care on 01/18/2024 by Randeen Laine LABOR, MD.  Called Nurse Triage reporting Animal Bite.  Symptoms began today.  Interventions attempted: OTC medications: peroxide.  Symptoms are: stable.  Triage Disposition: Go to ED Now (Notify PCP)  Patient/caregiver understands and will follow disposition?: Yes  Copied from CRM [id number]. Topic: Clinical - Red Word Triage >> Oct 31, 2024 11:06 AM Deleta RAMAN wrote: Red Word that prompted transfer to Nurse Triage: patient bit by a mouse Reason for Disposition  [1] Any break in skin from BITE (e.g., cut, puncture or scratch) AND[2] WILD animal at risk for RABIES (e.g., bat, raccoon, fox, skunk, coyote, other carnivores; see Background for list.)  Answer Assessment - Initial Assessment Questions Was releasing mouse from a trap a few minutes ago and the mouse bit her on the pointer finger of her left hand at the knuckle. She has cleansed it and put peroxide on it. She was reading about potential complications and wants to empiically treat with antibiotics to ensure she doesn't get infection from wild mouse  No appts today in office. Advised for immediate care UC. ED precautions understood.    1. APPEARANCE What does it look like?  (e.g., abrasion, bruise, puncture)      Small bite mark on her knuckle 2. SIZE: How big is the bite? (e.g., inches, cm; or compare to size of coin, pea, grape, ping pong ball)      Grain of rice  3. LOCATION: Where is the bite located?      Left Pointer finger at knuckle bend 4. ONSET: When did the bite happen? (e.g., minutes, hours ago)      A few minutes ago  5. ANIMAL: What type of animal caused the bite? Is the injury from a bite or a claw? If the animal is a dog or a cat, ask: Was it a pet or a stray? Was it acting ill or behaving strangely?     Wild Mouse- smaller ride  6. RABIES VACCINE: For dog or  cat bites, ask: Do you know if the pet is vaccinated against rabies?  (e.g., yes, no, overdue for rabies shot, unknown)     Denies  7. CIRCUMSTANCES: Tell me how this happened.      Trying to take mouse of out trap  8. TETANUS: When was your last tetanus booster?     10/2018 9. PREGNANCY: Is there any chance you are pregnant? When was your last menstrual period?     Denies  Protocols used: Animal Bite-A-AH
